# Patient Record
Sex: Female | Born: 1950
Health system: Southern US, Community
[De-identification: ages and names within clinical notes are randomized; demographics above are authoritative.]

## PROBLEM LIST (undated history)

## (undated) DIAGNOSIS — Z860100 Personal history of colon polyps, unspecified: Secondary | ICD-10-CM

## (undated) DIAGNOSIS — R011 Cardiac murmur, unspecified: Secondary | ICD-10-CM

## (undated) DIAGNOSIS — F329 Major depressive disorder, single episode, unspecified: Secondary | ICD-10-CM

## (undated) DIAGNOSIS — D649 Anemia, unspecified: Secondary | ICD-10-CM

## (undated) DIAGNOSIS — L4050 Arthropathic psoriasis, unspecified: Secondary | ICD-10-CM

## (undated) DIAGNOSIS — F419 Anxiety disorder, unspecified: Secondary | ICD-10-CM

## (undated) DIAGNOSIS — F32A Depression, unspecified: Secondary | ICD-10-CM

## (undated) DIAGNOSIS — I10 Essential (primary) hypertension: Secondary | ICD-10-CM

## (undated) DIAGNOSIS — T7840XA Allergy, unspecified, initial encounter: Secondary | ICD-10-CM

## (undated) DIAGNOSIS — M1990 Unspecified osteoarthritis, unspecified site: Secondary | ICD-10-CM

## (undated) DIAGNOSIS — I2510 Atherosclerotic heart disease of native coronary artery without angina pectoris: Secondary | ICD-10-CM

## (undated) DIAGNOSIS — K529 Noninfective gastroenteritis and colitis, unspecified: Secondary | ICD-10-CM

## (undated) DIAGNOSIS — E785 Hyperlipidemia, unspecified: Secondary | ICD-10-CM

## (undated) HISTORY — DX: Anxiety disorder, unspecified: F41.9

## (undated) HISTORY — DX: Allergy, unspecified, initial encounter: T78.40XA

## (undated) HISTORY — PX: WISDOM TOOTH EXTRACTION: SHX21

## (undated) HISTORY — DX: Depression, unspecified: F32.A

## (undated) HISTORY — DX: Unspecified osteoarthritis, unspecified site: M19.90

## (undated) HISTORY — DX: Arthropathic psoriasis, unspecified: L40.50

## (undated) HISTORY — DX: Hyperlipidemia, unspecified: E78.5

## (undated) HISTORY — DX: Major depressive disorder, single episode, unspecified: F32.9

## (undated) HISTORY — DX: Atherosclerotic heart disease of native coronary artery without angina pectoris: I25.10

## (undated) HISTORY — DX: Personal history of colon polyps, unspecified: Z86.0100

## (undated) HISTORY — DX: Personal history of colonic polyps: Z86.010

## (undated) HISTORY — DX: Essential (primary) hypertension: I10

## (undated) HISTORY — PX: TUBAL LIGATION: SHX77

---

## 1998-08-11 ENCOUNTER — Emergency Department (HOSPITAL_COMMUNITY): Admission: EM | Admit: 1998-08-11 | Discharge: 1998-08-11 | Payer: Self-pay | Admitting: Emergency Medicine

## 1998-08-16 ENCOUNTER — Ambulatory Visit (HOSPITAL_COMMUNITY): Admission: RE | Admit: 1998-08-16 | Discharge: 1998-08-16 | Payer: Self-pay | Admitting: Cardiovascular Disease

## 1998-12-03 ENCOUNTER — Other Ambulatory Visit: Admission: RE | Admit: 1998-12-03 | Discharge: 1998-12-03 | Payer: Self-pay | Admitting: Obstetrics & Gynecology

## 1999-10-15 ENCOUNTER — Other Ambulatory Visit: Admission: RE | Admit: 1999-10-15 | Discharge: 1999-10-15 | Payer: Self-pay | Admitting: Family Medicine

## 1999-10-17 ENCOUNTER — Ambulatory Visit (HOSPITAL_COMMUNITY): Admission: RE | Admit: 1999-10-17 | Discharge: 1999-10-17 | Payer: Self-pay | Admitting: *Deleted

## 2001-05-16 ENCOUNTER — Other Ambulatory Visit: Admission: RE | Admit: 2001-05-16 | Discharge: 2001-05-16 | Payer: Self-pay | Admitting: Obstetrics & Gynecology

## 2002-11-03 ENCOUNTER — Ambulatory Visit (HOSPITAL_BASED_OUTPATIENT_CLINIC_OR_DEPARTMENT_OTHER): Admission: RE | Admit: 2002-11-03 | Discharge: 2002-11-03 | Payer: Self-pay | Admitting: Internal Medicine

## 2002-11-13 ENCOUNTER — Other Ambulatory Visit: Admission: RE | Admit: 2002-11-13 | Discharge: 2002-11-13 | Payer: Self-pay | Admitting: Obstetrics & Gynecology

## 2004-06-03 ENCOUNTER — Other Ambulatory Visit: Admission: RE | Admit: 2004-06-03 | Discharge: 2004-06-03 | Payer: Self-pay | Admitting: Obstetrics & Gynecology

## 2005-07-22 ENCOUNTER — Other Ambulatory Visit: Admission: RE | Admit: 2005-07-22 | Discharge: 2005-07-22 | Payer: Self-pay | Admitting: Obstetrics & Gynecology

## 2006-09-29 ENCOUNTER — Encounter: Payer: Self-pay | Admitting: Cardiology

## 2006-11-17 ENCOUNTER — Encounter: Payer: Self-pay | Admitting: Cardiology

## 2007-01-25 ENCOUNTER — Encounter: Payer: Self-pay | Admitting: Cardiology

## 2007-02-16 ENCOUNTER — Encounter: Admission: RE | Admit: 2007-02-16 | Discharge: 2007-02-16 | Payer: Self-pay | Admitting: Cardiovascular Disease

## 2007-02-16 ENCOUNTER — Encounter: Payer: Self-pay | Admitting: Cardiology

## 2007-05-04 ENCOUNTER — Ambulatory Visit: Payer: Self-pay | Admitting: Gastroenterology

## 2007-05-31 ENCOUNTER — Ambulatory Visit: Payer: Self-pay | Admitting: Gastroenterology

## 2007-05-31 ENCOUNTER — Encounter: Payer: Self-pay | Admitting: Gastroenterology

## 2007-06-20 ENCOUNTER — Encounter: Payer: Self-pay | Admitting: Cardiology

## 2008-03-05 ENCOUNTER — Encounter: Payer: Self-pay | Admitting: Cardiology

## 2008-05-22 ENCOUNTER — Encounter: Payer: Self-pay | Admitting: Cardiology

## 2008-06-05 ENCOUNTER — Encounter: Payer: Self-pay | Admitting: Cardiology

## 2009-03-13 ENCOUNTER — Encounter: Payer: Self-pay | Admitting: Cardiology

## 2009-04-10 ENCOUNTER — Encounter: Payer: Self-pay | Admitting: Cardiology

## 2009-06-12 ENCOUNTER — Emergency Department (HOSPITAL_COMMUNITY): Admission: EM | Admit: 2009-06-12 | Discharge: 2009-06-13 | Payer: Self-pay | Admitting: Emergency Medicine

## 2009-10-15 ENCOUNTER — Encounter: Payer: Self-pay | Admitting: Cardiology

## 2009-11-13 LAB — CONVERTED CEMR LAB: Pap Smear: NORMAL

## 2010-02-20 ENCOUNTER — Encounter: Payer: Self-pay | Admitting: Cardiology

## 2010-10-01 LAB — HM PAP SMEAR: HM Pap smear: NORMAL

## 2010-10-20 ENCOUNTER — Telehealth (INDEPENDENT_AMBULATORY_CARE_PROVIDER_SITE_OTHER): Payer: Self-pay | Admitting: *Deleted

## 2010-10-20 ENCOUNTER — Ambulatory Visit: Payer: Self-pay | Admitting: Internal Medicine

## 2010-10-20 DIAGNOSIS — M545 Low back pain, unspecified: Secondary | ICD-10-CM | POA: Insufficient documentation

## 2010-10-20 DIAGNOSIS — Z860100 Personal history of colon polyps, unspecified: Secondary | ICD-10-CM | POA: Insufficient documentation

## 2010-10-20 DIAGNOSIS — E785 Hyperlipidemia, unspecified: Secondary | ICD-10-CM | POA: Insufficient documentation

## 2010-10-20 DIAGNOSIS — R071 Chest pain on breathing: Secondary | ICD-10-CM

## 2010-10-20 DIAGNOSIS — M1990 Unspecified osteoarthritis, unspecified site: Secondary | ICD-10-CM | POA: Insufficient documentation

## 2010-10-20 DIAGNOSIS — I10 Essential (primary) hypertension: Secondary | ICD-10-CM

## 2010-10-20 DIAGNOSIS — J4530 Mild persistent asthma, uncomplicated: Secondary | ICD-10-CM | POA: Insufficient documentation

## 2010-10-20 DIAGNOSIS — J309 Allergic rhinitis, unspecified: Secondary | ICD-10-CM

## 2010-10-20 DIAGNOSIS — F418 Other specified anxiety disorders: Secondary | ICD-10-CM

## 2010-10-21 ENCOUNTER — Encounter: Payer: Self-pay | Admitting: Internal Medicine

## 2010-10-30 ENCOUNTER — Encounter: Payer: Self-pay | Admitting: Internal Medicine

## 2010-10-30 ENCOUNTER — Telehealth: Payer: Self-pay | Admitting: Internal Medicine

## 2010-10-30 ENCOUNTER — Ambulatory Visit: Payer: Self-pay | Admitting: Internal Medicine

## 2010-10-30 DIAGNOSIS — H9319 Tinnitus, unspecified ear: Secondary | ICD-10-CM | POA: Insufficient documentation

## 2010-10-30 DIAGNOSIS — R002 Palpitations: Secondary | ICD-10-CM

## 2010-10-30 DIAGNOSIS — H9313 Tinnitus, bilateral: Secondary | ICD-10-CM | POA: Insufficient documentation

## 2010-10-30 LAB — CONVERTED CEMR LAB
ALT: 15 units/L (ref 0–35)
Albumin: 4.1 g/dL (ref 3.5–5.2)
Alkaline Phosphatase: 62 units/L (ref 39–117)
Basophils Relative: 0.9 % (ref 0.0–3.0)
Bilirubin, Direct: 0.1 mg/dL (ref 0.0–0.3)
CO2: 31 meq/L (ref 19–32)
Chloride: 100 meq/L (ref 96–112)
Eosinophils Absolute: 0.1 10*3/uL (ref 0.0–0.7)
Eosinophils Relative: 1.5 % (ref 0.0–5.0)
Hemoglobin: 12.1 g/dL (ref 12.0–15.0)
MCHC: 33.3 g/dL (ref 30.0–36.0)
MCV: 85.8 fL (ref 78.0–100.0)
Monocytes Absolute: 0.3 10*3/uL (ref 0.1–1.0)
Neutro Abs: 1.7 10*3/uL (ref 1.4–7.7)
Potassium: 3.5 meq/L (ref 3.5–5.1)
RBC: 4.22 M/uL (ref 3.87–5.11)
Sodium: 138 meq/L (ref 135–145)
Total Protein: 7.2 g/dL (ref 6.0–8.3)
WBC: 3.8 10*3/uL — ABNORMAL LOW (ref 4.5–10.5)

## 2010-11-03 ENCOUNTER — Ambulatory Visit: Payer: Self-pay | Admitting: Cardiology

## 2010-11-11 ENCOUNTER — Ambulatory Visit: Payer: Self-pay | Admitting: Cardiology

## 2010-11-11 ENCOUNTER — Encounter: Payer: Self-pay | Admitting: Cardiology

## 2010-11-11 ENCOUNTER — Ambulatory Visit: Payer: Self-pay

## 2010-11-11 ENCOUNTER — Ambulatory Visit (HOSPITAL_COMMUNITY): Admission: RE | Admit: 2010-11-11 | Discharge: 2010-11-11 | Payer: Self-pay | Admitting: Cardiology

## 2010-11-11 ENCOUNTER — Ambulatory Visit: Payer: Self-pay | Admitting: Internal Medicine

## 2010-11-14 ENCOUNTER — Telehealth: Payer: Self-pay | Admitting: Cardiology

## 2010-11-14 ENCOUNTER — Telehealth: Payer: Self-pay | Admitting: Internal Medicine

## 2010-11-20 ENCOUNTER — Telehealth: Payer: Self-pay | Admitting: Internal Medicine

## 2010-11-28 ENCOUNTER — Ambulatory Visit: Payer: Self-pay | Admitting: Cardiology

## 2011-01-13 NOTE — Progress Notes (Signed)
  Phone Note Other Incoming   Request: Send information Summary of Call: Patient completed a [facility] medical release to obtain copies of her records from Dr. Roseanne Kaufman office. Release faxed to [phone]. Patient also requested a copy of her completed release.

## 2011-01-13 NOTE — Assessment & Plan Note (Signed)
Summary: NP6/ PALPS/ PT HAS BCBS. GD  Medications Added DICLOFENAC SODIUM CR 100 MG XR24H-TAB (DICLOFENAC SODIUM) as needed FLUTICASONE PROPIONATE 50 MCG/ACT SUSP (FLUTICASONE PROPIONATE) as needed MIMVEY 1-0.5 MG TABS (ESTRADIOL-NORETHINDRONE ACET) 1 tab every other day ASTEPRO 0.15 % SOLN (AZELASTINE HCL) as needed KLOR-CON M20 20 MEQ CR-TABS (POTASSIUM CHLORIDE CRYS CR) Take 1 tablet daily        Visit Type:  new pt visit Referring Charmaine Placido:  Sanda Linger Primary Cleatis Fandrich:  Etta Grandchild MD  CC:  palpitations....sob....denies any chest discomfort.....some edema at times.....  History of Present Illness: [name] is a 60 year old black female referred by Dr. Yetta Barre for palpitations.  These been evaluated in the past when she was going through menopause. She has been followed by Dr. Wylie Hail who by her history did an echocardiogram which was normal as well as a stress test. She has sign release of the records but we do not have them.  Her palpitations got worse over the last couple months. She was taking Wellbutrin which when she stopped her palpitations improved. There is spontaneous. They do not occur occur with exertion. They only lasts a second or 2 have not been associated with chest pain, syncope or presyncope.  She drinks 2 cups of caffeinated coffee per day. She has done cocaine in the past but not for a number of years. She quit smoking in 1988.  Recent blood work by Dr. Vanessa Kick showed potassium 3.5, normal TSH, hemoglobin 12.1. Her electrocardiogram has shown T-wave inversion the past. Today shows sinus rhythm with low voltage with RSR prime in V1 and V2 with T-wave inversion in V1 through V6. I do not have an old one to compare. This was obtained on 17 November by Dr. Yetta Barre.  Current Medications (verified): 1)  Diclofenac Sodium Cr 100 Mg Xr24h-Tab (Diclofenac Sodium) .... As Needed 2)  Fluticasone Propionate 50 Mcg/act Susp (Fluticasone Propionate) .... As Needed 3)   Hydrochlorothiazide 25 Mg Tabs (Hydrochlorothiazide) .... Take 1 Tablet By Mouth Once A Day 4)  Alprazolam 0.25 Mg Tabs (Alprazolam) .... Take 1 Tablet By Mouth Once A Day As Needed 5)  Mimvey 1-0.5 Mg Tabs (Estradiol-Norethindrone Acet) .[name] Kitchen.. 1 Tab Every Other Day 6)  Astepro 0.15 % Soln (Azelastine Hcl) .... As Needed  Allergies: 1)  ! Tetracycline 2)  ! Cymbalta 3)  ! 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[employer] Divorced Never Smoked Alcohol use-yes Drug use-no Regular exercise-no  Risk Factors: Alcohol Use: <1 (10/30/2010) >5 drinks/d w/in last 3 months: no (10/30/2010) Exercise: no (10/20/2010)  Risk Factors: Smoking Status: never (10/30/2010)  Review of Systems       negative other than history of present illness the  Vital Signs:  Patient profile:   60 year old female Menstrual status:  postmenopausal Height:      65 inches Weight:      219.4 pounds BMI:     36.64 Pulse rate:   66 / minute Pulse rhythm:   regular BP sitting:   118 / 82  (left arm) Cuff size:   large  Vitals Entered By: Danielle Rankin, CMA (November 03, 2010 3:08 PM)  Physical Exam  General:  obese.  pleasant, in no  acute distress, poor historian. Head:  normocephalic and atraumatic Eyes:  PERRLA/EOM intact; conjunctiva and lids normal. Neck:  Neck supple, no JVD. No masses, thyromegaly or abnormal cervical nodes. Lungs:  Clear bilaterally to auscultation and percussion. Heart:  A. poorly appreciated, soft S1-S2, soft systolic murmur with inspiration, S2 splits, no right ventricular lift, carotids are  full without bruits Abdomen:  soft, good bowel sounds, nondistended, no bruits Msk:  Back normal, normal gait. Muscle strength and tone normal. Pulses:  pulses normal in all 4 extremities Extremities:  No clubbing or cyanosis. Neurologic:  Alert and oriented x 3. Skin:  Intact without lesions or rashes. Psych:  Normal affect.easily distracted.     Impression & Recommendations:  Problem # 1:  PALPITATIONS (ICD-785.1) Assessment Deteriorated  I suspect these are benign premature beats. We'll obtain a 24-hour Holter monitor to identify them since this is been somewhat confusing to the patient in the past. We'll also obtain a 2-D echocardiogram particularly with EKG changes to make sure there is no structural heart disease. I suspect that her palpitations would be less if her potassium was above 4. Will prescribe potassium supplementation of 20 mEq a day. We'll call patient with results of the echo and Holter. We'll also try to get the records from her previous cardiologist as noted in the history of present illness. The following medications were removed from the medication list:    Metoprolol Succinate 25 Mg Xr24h-tab (Metoprolol succinate) .[name] Kitchen... Take 1 tablet by mouth once a day  Orders: EKG w/ Interpretation (93000) Holter Monitor (Holter Monitor) Echocardiogram (Echo)  Problem # 2:  HYPERTENSION (ICD-401.9)  The following medications were removed from the medication list:    Metoprolol Succinate 25 Mg Xr24h-tab (Metoprolol succinate) .[name] Kitchen... Take 1 tablet by mouth once a day Her updated medication list for this problem includes:    Hydrochlorothiazide 25 Mg Tabs (Hydrochlorothiazide) .[name] Kitchen... Take 1 tablet by mouth once a day  Patient Instructions: 1)  Your physician recommends that you schedule a follow-up appointment in: 2-3 weeks with Dr. Daleen Squibb 2)  Your physician has recommended you make the following change in your medication:  3)  Your physician has requested that you have an  echocardiogram.  Echocardiography is a painless test that uses sound waves to create images of your heart. It provides your doctor with information about the size and shape of your heart and how well your heart's chambers and valves are working.  This procedure takes approximately one hour. There are no restrictions for this procedure. 4)  Your physician has recommended that you wear a holter monitor.  Holter monitors are medical devices that record the heart's electrical activity. Doctors most often use these monitors to diagnose arrhythmias. Arrhythmias are problems with the speed or rhythm of the heartbeat. The monitor is a small, portable device. You can wear one while you do your normal daily activities. This is usually used to diagnose what is causing palpitations/syncope (passing out). Prescriptions: KLOR-CON M20 20 MEQ CR-TABS (POTASSIUM CHLORIDE CRYS CR) Take 1 tablet daily  #30 x 6   Entered by:   Lisabeth Devoid RN   Authorized by:   Gaylord Shih, MD, [facility]   Signed by:   Lisabeth Devoid RN on 11/03/2010   Method used:   Electronically to        [facility]  [address] [phone] (retail)       [address]       [place]  [place], [place]  [id number]       Ph: [phone] or [phone]       Fax: [phone]   RxID:   [id number]

## 2011-01-13 NOTE — Letter (Signed)
Summary: Results Follow-up Letter  [facility]  [address] [place], [place] [address]   Phone: [phone]  Fax: [phone]    10/21/2010  [address] [facility] [place], [place]  [address]  Dear Ms. Esqueda,   The following are the results of your recent test(s):  Test     Result     Chest Xray     normal Low back Xray   worsening arthritis  _________________________________________________________  Please call for an appointment in 1-2 months _________________________________________________________ _________________________________________________________ _________________________________________________________  Sincerely,  Sanda Linger MD [facility]

## 2011-01-13 NOTE — Progress Notes (Signed)
Summary: echo results   Phone Note Call from Patient Call back at cell#[phone]   Caller: Patient Reason for Call: Talk to Nurse, Lab or Test Results Summary of Call: re echo results Initial call taken by: Roe Coombs,  November 14, 2010 3:29 PM  Follow-up for Phone Call        11/14/10--1600pm--pt calling about results of echo--when reviewing echo i realized it had not been signed by dr wall but by dr Adalberto Ill as reader,  and by PCP,  dr jones--dr Yetta Barre wanted pt  to call and f/u with dr wall, concerning echo--looking at results I  realized dr wall must look at it before i call pt to have her make an appoint.--i explained this to pt and reassured pt that this was nothing life threatening and that i would rather have dr wall look at it before we brought her in --also gave results of monitor she wore, which were also OK --Dr wall--please advise what you would like [place] to do--thanks nt Follow-up by: Ledon Snare, RN,  November 14, 2010 4:26 PM     Appended Document: echo results see echo note.  Reviewed Juanito Doom, MD

## 2011-01-13 NOTE — Progress Notes (Signed)
Summary: RESULTS  Phone Note Call from Patient Call back at [phone]   Summary of Call: Patient is requesting results of echo.  Initial call taken by: Lamar Sprinkles, CMA,  November 14, 2010 1:50 PM  Follow-up for Phone Call        abnormal, please f/up with Dr. Daleen Squibb Follow-up by: Etta Grandchild MD,  November 14, 2010 1:53 PM  Additional Follow-up for Phone Call Additional follow up Details #1::        Pt informed  Additional Follow-up by: Lamar Sprinkles, CMA,  November 14, 2010 3:16 PM

## 2011-01-13 NOTE — Assessment & Plan Note (Signed)
Summary: NEW/ BCBS /[place]  #   Vital Signs:  Patient profile:   60 year old female Menstrual status:  postmenopausal Height:      65 inches Weight:      214.50 pounds BMI:     35.82 O2 Sat:      97 % on Room air Temp:     98.2 degrees F oral Pulse rate:   84 / minute Pulse rhythm:   regular Resp:     16 per minute BP sitting:   128 / 80  (left arm) Cuff size:   large  Vitals Entered By: Rock Nephew CMA (October 20, 2010 2:53 PM)  Nutrition Counseling: Patient's BMI is greater than 25 and therefore counseled on weight management options.  O2 Flow:  Room air CC: New to establish c/o back , fatigue and arthritis, Back Pain Is Patient Diabetic? No Pain Assessment Patient in pain? no       Does patient need assistance? Functional Status Self care Ambulation Normal     Menstrual Status postmenopausal Last PAP Result normal   Primary Care Provider:  Etta Grandchild MD  CC:  New to establish c/o back , fatigue and arthritis, and Back Pain.  History of Present Illness: New to me she needs a new PCP. She has been seeing another doctor for an abnormal EKG and it sounds like she has diastolic dysfunction. She has no records with her today. Her main complaint is pain in her left rib cage and low back for over a year. Her prior MD told her that she had DJD and she gets good relief with an nsaid.  Also, she wants to stop wellbutrin due to palpitations and she feels like celexa is making her too sleepy.  Back Pain History:      The patient's back pain started approximately 10/22/2009.  The pain is located in the lower back region and does not radiate below the knees.  She states this is not work related.  On a scale of 1-10, she describes the pain as a 2.  She states that she has no prior history of back pain.  The patient has not had any recent physical therapy for her back pain.    Critical Exclusionary Diagnosis Criteria (CEDC) for Back Pain:      The patient denies a  history of previous trauma.  She has no prior history of spinal surgery.  There are no symptoms to suggest infection, cauda equina, or psychosocial factors for back pain.  Cancer risk factors include age >50 yrs with new back pain.    Preventive Screening-Counseling & Management  Alcohol-Tobacco     Alcohol drinks/day: <1     Alcohol type: all     >5/day in last 3 mos: no     Alcohol Counseling: not indicated; use of alcohol is not excessive or problematic     Feels need to cut down: no     Feels annoyed by complaints: no     Feels guilty re: drinking: no     Needs 'eye opener' in am: no     Smoking Status: never     Tobacco Counseling: not indicated; no tobacco use  Caffeine-Diet-Exercise     Does Patient Exercise: no  Hep-HIV-STD-Contraception     Hepatitis Risk: no risk noted     HIV Risk: no risk noted     STD Risk: no risk noted      Drug Use:  no.  Medications Prior to Update: 1)  None  Current Medications (verified): 1)  Diclofenac Sodium Cr 100 Mg Xr24h-Tab (Diclofenac Sodium) .... Take 1 Tablet By Mouth Once A Day As Needed 2)  Budeprion Xl 150 Mg Xr24h-Tab (Bupropion Hcl) .... Take 1 Tablet By Mouth Every Morning 3)  Fluticasone Propionate 50 Mcg/act Susp (Fluticasone Propionate) .... 2 Puffs Each Nostril Once Daily 4)  Hydrochlorothiazide 25 Mg Tabs (Hydrochlorothiazide) .... Take 1 Tablet By Mouth Once A Day 5)  Alprazolam 0.25 Mg Tabs (Alprazolam) .... Take 1 Tablet By Mouth Once A Day As Needed 6)  Metoprolol Succinate 25 Mg Xr24h-Tab (Metoprolol Succinate) .... Take 1 Tablet By Mouth Once A Day 7)  Citalopram Hydrobromide 40 Mg Tabs (Citalopram Hydrobromide) .... 1/2 Once Daily 8)  Mimvey 1-0.5 Mg Tabs (Estradiol-Norethindrone Acet) 9)  Astepro 0.15 % Soln (Azelastine Hcl)  Allergies (verified): 1)  ! Tetracycline 2)  ! Cymbalta 3)  ! 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  The patient denies anorexia, fever, syncope, dyspnea on exertion, peripheral edema, prolonged cough, headaches, hemoptysis, abdominal pain, melena, hematochezia, severe indigestion/heartburn, muscle weakness, suspicious skin lesions, difficulty walking, depression, enlarged lymph nodes, angioedema, and breast masses.   CV:  Denies fainting, fatigue, leg cramps with exertion, lightheadness, near fainting, palpitations, shortness of breath with exertion, and swelling of feet. Resp:  Complains of chest discomfort; denies chest pain with inspiration, cough, coughing up blood, excessive snoring, morning headaches, sputum productive, and wheezing. Psych:  Denies alternate hallucination ( auditory/visual), anxiety, depression, easily angered, easily tearful, irritability, mental problems, panic attacks, sense of great danger, suicidal thoughts/plans, thoughts of violence, unusual visions or sounds, and thoughts /plans of harming others.  Physical Exam  General:  alert, well-developed, well-nourished, well-hydrated, appropriate dress, normal appearance, healthy-appearing, cooperative to examination, good hygiene, and overweight-appearing.   Head:  normocephalic, atraumatic, no abnormalities observed, and no abnormalities palpated.   Eyes:  vision grossly intact, pupils equal, pupils round, and pupils reactive to light.   Mouth:  Oral mucosa and oropharynx without lesions or exudates.   Teeth in good repair. Neck:  supple, full ROM, no masses, no thyromegaly, no JVD, normal carotid upstroke, no carotid bruits, no cervical lymphadenopathy, and no neck tenderness.   Chest Wall:  she has ttp over the left posterior rib cage but no crepitance or skin lesions. Lungs:  normal respiratory effort, no intercostal retractions, no accessory muscle use, normal breath sounds, no dullness, no fremitus, no crackles, and no wheezes.   Heart:  normal rate, regular rhythm, no murmur, no gallop, no rub, and no JVD.   Abdomen:  soft, non-tender, normal bowel sounds, no distention, no masses, no guarding, no rigidity, no rebound tenderness, no abdominal hernia, no inguinal hernia, no hepatomegaly, and no splenomegaly.   Msk:  No deformity or scoliosis noted of thoracic or lumbar spine.   Pulses:  R and L carotid,radial,femoral,dorsalis pedis and posterior tibial pulses are full and equal bilaterally  Extremities:  No clubbing, cyanosis, edema, or deformity noted with normal full range of motion of all joints.   Neurologic:  No cranial nerve deficits noted. Station and gait are normal. Plantar reflexes are down-going bilaterally. DTRs are symmetrical throughout. Sensory, motor and coordinative functions appear intact. Skin:  Intact without suspicious lesions or rashes Cervical Nodes:  No lymphadenopathy noted Axillary Nodes:  no R axillary adenopathy and no L axillary adenopathy.   Inguinal Nodes:  no R inguinal adenopathy and no L inguinal adenopathy.   Psych:  Cognition and judgment appear intact. Alert and cooperative with normal attention span and concentration. No apparent delusions, illusions, hallucinations  Low Back Pain Physical Exam:    Inspection-deformity:     No    Palpation-spinal tenderness:   No    Motor Exam/Strength:         Left Ankle Dorsiflexion (L5,L4):     normal       Left Great Toe Dorsiflexion (L5,L4):     normal       Left Heel Walk (L5,some L4):     normal       Left  Single Squat & Rise-Quads (L4):   normal       Left Toe Walk-calf (S1):       normal       Right Ankle Dorsiflexion (L5,L4):     normal       Right Great Toe Dorsiflexion (L5,L4):       normal       Right Heel Walk (L5,some L4):     normal       Right Single Squat & Rise Quads (L4):   normal       Right Toe Walk-calf (S1):       normal    Sensory Exam/Pinprick:        Left Medial Foot (L4):   normal       Left Dorsal Foot (L5):   normal       Left Lateral Foot (S1):   normal       Right Medial Foot (L4):   normal       Right Dorsal Foot (L5):   normal       Right Lateral Foot (S1):   normal    Reflexes:        Left Knee Jerk (L4):     normal       Left Ankle Reflex (S1):   normal       Right Knee Jerk:     normal       Right Ankle Reflex (S1):   normal    Straight Leg Raise (SLR):       Left Straight Leg Raise (SLR):   negative       Right Straight Leg Raise (SLR):   negative   Impression & Recommendations:  Problem # 1:  CHEST WALL PAIN, ACUTE (ICD-786.52) will check for abnormalities in the rib cage, she tells me that her heart has had a thorough exam several times, I await records from Dr. Kallie Edward Her updated medication list for this problem includes:    Diclofenac Sodium Cr 100 Mg Xr24h-tab (Diclofenac sodium) .[name] Kitchen... Take 1 tablet by mouth once a day as needed  Orders: T-2 View CXR (71020TC)  Problem # 2:  LOW BACK PAIN, ACUTE (ICD-724.2) Assessment: New will check her for DJD Her updated medication list for this problem includes:    Diclofenac Sodium Cr 100 Mg Xr24h-tab (Diclofenac sodium) .[name] Kitchen... Take 1 tablet  by mouth once a day as needed  Orders: T-Lumbar Spine Complete, 5 Views (71110TC)  Problem # 3:  HYPERTENSION (ICD-401.9) Assessment: Improved  Her updated medication list for this problem includes:    Hydrochlorothiazide 25 Mg Tabs (Hydrochlorothiazide) .[name] Kitchen... Take 1 tablet by mouth once a day    Metoprolol Succinate 25 Mg Xr24h-tab (Metoprolol succinate) .[name] Kitchen...  Take 1 tablet by mouth once a day  BP today: 128/80  Problem # 4:  DEPRESSION (ICD-311) Assessment: Unchanged she will taper off of citalopram The following medications were removed from the medication list:    Budeprion Xl 150 Mg Xr24h-tab (Bupropion hcl) .[name] Kitchen... Take 1 tablet by mouth every morning Her updated medication list for this problem includes:    Alprazolam 0.25 Mg Tabs (Alprazolam) .[name] Kitchen... Take 1 tablet by mouth once a day as needed    Citalopram Hydrobromide 40 Mg Tabs (Citalopram hydrobromide) .[name] Kitchen... 1/2 once daily  Complete Medication List: 1)  Diclofenac Sodium Cr 100 Mg Xr24h-tab (Diclofenac sodium) .... Take 1 tablet by mouth once a day as needed 2)  Fluticasone Propionate 50 Mcg/act Susp (Fluticasone propionate) .... 2 puffs each nostril once daily 3)  Hydrochlorothiazide 25 Mg Tabs (Hydrochlorothiazide) .... Take 1 tablet by mouth once a day 4)  Alprazolam 0.25 Mg Tabs (Alprazolam) .... Take 1 tablet by mouth once a day as needed 5)  Metoprolol Succinate 25 Mg Xr24h-tab (Metoprolol succinate) .... Take 1 tablet by mouth once a day 6)  Citalopram Hydrobromide 40 Mg Tabs (Citalopram hydrobromide) .... 1/2 once daily 7)  Mimvey 1-0.5 Mg Tabs (Estradiol-norethindrone acet) 8)  Astepro 0.15 % Soln (Azelastine hcl)  PAP Screening:    Hx Cervical Dysplasia in last 5 yrs? No    3 normal PAP smears in last 5 yrs? Yes    Last PAP smear:  11/13/2009  Mammogram Screening:    Last Mammogram:  11/13/2009  Osteoporosis Risk Assessment:  Risk Factors for Fracture or Low Bone Density:   Smoking status:       never   Patient Instructions: 1)  Please schedule a follow-up appointment in 1 month. 2)  Take 650-1000mg  of Tylenol every 4-6 hours as needed for relief of pain or comfort of fever AVOID taking more than 4000mg   in a 24 hour period (can cause liver damage in higher doses).   Orders Added: 1)  T-2 View CXR [71020TC] 2)  T-Lumbar Spine Complete, 5 Views [71110TC] 3)  New  Patient Level IV [16109]    Preventive Care Screening  Colonoscopy:    Date:  05/31/2007    Next Due:  06/2017    Results:  done   Mammogram:    Date:  11/13/2009    Results:  normal   Pap Smear:    Date:  11/13/2009    Results:  normal

## 2011-01-13 NOTE — Assessment & Plan Note (Signed)
Summary: palpatations/SD   Vital Signs:  Patient profile:   60 year old female Menstrual status:  postmenopausal Height:      65 inches Weight:      215 pounds O2 Sat:      98 % on Room air Temp:     98.3 degrees F oral Pulse rate:   64 / minute Pulse rhythm:   regular Resp:     16 per minute BP sitting:   136 / 82  (left arm) Cuff size:   large  Vitals Entered By: Rock Nephew CMA (October 30, 2010 4:38 PM)  O2 Flow:  Room air  Primary Care Provider:  Etta Grandchild MD  CC:  Palpitations.  History of Present Illness:  Palpitations      This is a 60 year old woman who presents with Palpitations.  The symptoms began >1 year ago.  The intensity is described as mild.  The patient denies dizziness, presyncope, syncope, chest pain, shortness of breath, and throat tightness.  The patient denies the following symptoms: blurred vision, numbness, weakness, diaphoresis, nausea, shortness of breath, weight loss, and heat intolerance.  The palpitations are described as a sensation of the heart skipping beats.  The palpitations are intermittent and occur weekly.  She has had some prior evaluation but those records are not available to me yet. She has stopped Wellbutrin and Celexa and will soon change to Viibryd per Dr. Evelene Croon. She is also going to stop taking Clonidine.  She also complains of tinnitus for "a while".  She has not had anymore back pain since I last saw her.  She continues to worry about the palpable edge of her left antero-medial rib cage. It is not painful but she is concerned that she can feel it. Xrays were normal.  Hypertension History:      She denies headache, chest pain, palpitations, dyspnea with exertion, orthopnea, PND, peripheral edema, visual symptoms, neurologic problems, syncope, and side effects from treatment.  She notes no problems with any antihypertensive medication side effects.        Positive major cardiovascular risk factors include female age 58 years  old or older, hyperlipidemia, and hypertension.  Negative major cardiovascular risk factors include no history of diabetes, negative family history for ischemic heart disease, and non-tobacco-user status.        Further assessment for target organ damage reveals no history of ASHD, cardiac end-organ damage (CHF/LVH), stroke/TIA, peripheral vascular disease, renal insufficiency, or hypertensive retinopathy.     Preventive Screening-Counseling & Management  Alcohol-Tobacco     Alcohol drinks/day: <1     Alcohol type: all     >5/day in last 3 mos: no     Alcohol Counseling: not indicated; use of alcohol is not excessive or problematic     Feels need to cut down: no     Feels annoyed by complaints: no     Feels guilty re: drinking: no     Needs 'eye opener' in am: no     Smoking Status: never     Tobacco Counseling: not indicated; no tobacco use  Hep-HIV-STD-Contraception     Hepatitis Risk: no risk noted     HIV Risk: no risk noted     STD Risk: no risk noted      Drug Use:  no.    Medications Prior to Update: 1)  Diclofenac Sodium Cr 100 Mg Xr24h-Tab (Diclofenac Sodium) .... Take 1 Tablet By Mouth Once A Day As Needed 2)  Fluticasone Propionate 50 Mcg/act Susp (Fluticasone Propionate) .... 2 Puffs Each Nostril Once Daily 3)  Hydrochlorothiazide 25 Mg Tabs (Hydrochlorothiazide) .... Take 1 Tablet By Mouth Once A Day 4)  Alprazolam 0.25 Mg Tabs (Alprazolam) .... Take 1 Tablet By Mouth Once A Day As Needed 5)  Metoprolol Succinate 25 Mg Xr24h-Tab (Metoprolol Succinate) .... Take 1 Tablet By Mouth Once A Day 6)  Citalopram Hydrobromide 40 Mg Tabs (Citalopram Hydrobromide) .... 1/2 Once Daily 7)  Mimvey 1-0.5 Mg Tabs (Estradiol-Norethindrone Acet) 8)  Astepro 0.15 % Soln (Azelastine Hcl)  Current Medications (verified): 1)  Diclofenac Sodium Cr 100 Mg Xr24h-Tab (Diclofenac Sodium) .... Take 1 Tablet By Mouth Once A Day As Needed 2)  Fluticasone Propionate 50 Mcg/act Susp (Fluticasone  Propionate) .... 2 Puffs Each Nostril Once Daily 3)  Hydrochlorothiazide 25 Mg Tabs (Hydrochlorothiazide) .... Take 1 Tablet By Mouth Once A Day 4)  Alprazolam 0.25 Mg Tabs (Alprazolam) .... Take 1 Tablet By Mouth Once A Day As Needed 5)  Metoprolol Succinate 25 Mg Xr24h-Tab (Metoprolol Succinate) .... Take 1 Tablet By Mouth Once A Day 6)  Mimvey 1-0.5 Mg Tabs (Estradiol-Norethindrone Acet) 7)  Astepro 0.15 % Soln (Azelastine Hcl)  Allergies (verified): 1)  ! Tetracycline 2)  ! Cymbalta 3)  ! 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 Family History of Arthritis Family History Depression  Social History: Reviewed history from 10/20/2010 and no changes required. 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  The patient denies anorexia, fever, weight loss, chest pain, syncope, dyspnea on exertion, peripheral edema, prolonged cough, headaches, hemoptysis, abdominal pain, melena, hematochezia, severe indigestion/heartburn, hematuria, muscle weakness, suspicious skin lesions, difficulty walking, depression, enlarged lymph nodes, and angioedema.   ENT:   Complains of ringing in ears; denies decreased hearing, difficulty swallowing, ear discharge, earache, hoarseness, nasal congestion, nosebleeds, postnasal drainage, sinus pressure, and sore throat. CV:  Complains of palpitations; denies chest pain or discomfort, difficulty breathing at night, fainting, fatigue, leg cramps with exertion, lightheadness, near fainting, shortness of breath with exertion, and swelling of feet. Resp:  Denies chest pain with inspiration, cough, coughing up blood, pleuritic, shortness of breath, sputum productive, and wheezing. Psych:  Denies anxiety, depression, easily angered, easily tearful, irritability, mental problems, panic attacks, sense of great danger, suicidal thoughts/plans, thoughts of violence, and unusual visions or sounds.  Physical Exam  General:  alert, well-developed, well-nourished, well-hydrated, appropriate dress, normal appearance, healthy-appearing, cooperative to examination, good hygiene, and overweight-appearing.   Head:  normocephalic, atraumatic, no abnormalities observed, and no abnormalities palpated.   Eyes:  No corneal or conjunctival inflammation noted. EOMI. Perrla. Funduscopic exam benign, without hemorrhages, exudates or papilledema. Vision grossly normal. Mouth:  Oral mucosa and oropharynx without lesions or exudates.  Teeth in good repair. Neck:  supple, full ROM, no masses, no thyromegaly,  no JVD, normal carotid upstroke, no carotid bruits, no cervical lymphadenopathy, and no neck tenderness.   Chest Wall:  no deformities, no tenderness, and no mass.   Lungs:  normal respiratory effort, no intercostal retractions, no accessory muscle use, normal breath sounds, no dullness, no fremitus, no crackles, and no wheezes.   Heart:  normal rate, regular rhythm, no murmur, no gallop, no rub, and no JVD.   Abdomen:  soft, non-tender, normal bowel sounds, no distention, no masses, no guarding, no rigidity, no rebound tenderness, no abdominal  hernia, no inguinal hernia, no hepatomegaly, and no splenomegaly.   Msk:  No deformity or scoliosis noted of thoracic or lumbar spine.   Pulses:  R and L carotid,radial,femoral,dorsalis pedis and posterior tibial pulses are full and equal bilaterally Extremities:  No clubbing, cyanosis, edema, or deformity noted with normal full range of motion of all joints.   Neurologic:  No cranial nerve deficits noted. Station and gait are normal. Plantar reflexes are down-going bilaterally. DTRs are symmetrical throughout. Sensory, motor and coordinative functions appear intact. Skin:  Intact without suspicious lesions or rashes Cervical Nodes:  no anterior cervical adenopathy and no posterior cervical adenopathy.   Axillary Nodes:  no R axillary adenopathy and no L axillary adenopathy.   Inguinal Nodes:  no R inguinal adenopathy and no L inguinal adenopathy.   Psych:  Cognition and judgment appear intact. Alert and cooperative with normal attention span and concentration. No apparent delusions, illusions, hallucinations Additional Exam:  EKG is unremarkable   Impression & Recommendations:  Problem # 1:  TINNITUS, CHRONIC (ICD-388.30) Assessment New  Orders: Audiology (Audio) Venipuncture [phone]) TLB-BMP (Basic Metabolic Panel-BMET) (80048-METABOL) TLB-CBC Platelet - w/Differential (85025-CBCD) TLB-Hepatic/Liver Function Pnl (80076-HEPATIC) TLB-TSH (Thyroid Stimulating Hormone) (84443-TSH)  Problem # 2:  PALPITATIONS (ICD-785.1) Assessment: New  Her updated medication list for this problem includes:    Metoprolol Succinate 25 Mg Xr24h-tab (Metoprolol succinate) .[name] Kitchen... Take 1 tablet by mouth once a day  Orders: Cardiology Referral (Cardiology) Venipuncture (09381) TLB-BMP (Basic Metabolic Panel-BMET) (80048-METABOL) TLB-CBC Platelet - w/Differential (85025-CBCD) TLB-Hepatic/Liver Function Pnl (80076-HEPATIC) TLB-TSH (Thyroid Stimulating Hormone) (84443-TSH) EKG w/ Interpretation  (93000)  Problem # 3:  HYPERTENSION (ICD-401.9) Assessment: Unchanged  Her updated medication list for this problem includes:    Hydrochlorothiazide 25 Mg Tabs (Hydrochlorothiazide) .[name] Kitchen... Take 1 tablet by mouth once a day    Metoprolol Succinate 25 Mg Xr24h-tab (Metoprolol succinate) .[name] Kitchen... Take 1 tablet by mouth once a day  Orders: Venipuncture (82993) TLB-BMP (Basic Metabolic Panel-BMET) (80048-METABOL) TLB-CBC Platelet - w/Differential (85025-CBCD) TLB-Hepatic/Liver Function Pnl (80076-HEPATIC) TLB-TSH (Thyroid Stimulating Hormone) (84443-TSH)  BP today: 136/82 Prior BP: 128/80 (10/20/2010)  10 Yr Risk Heart Disease: Not enough information  Problem # 4:  DEPRESSION (ICD-311) Assessment: Unchanged  The following medications were removed from the medication list:    Citalopram Hydrobromide 40 Mg Tabs (Citalopram hydrobromide) .[name] Kitchen... 1/2 once daily Her updated medication list for this problem includes:    Alprazolam 0.25 Mg Tabs (Alprazolam) .[name] Kitchen... Take 1 tablet by mouth once a day as needed  Problem # 5:  CHEST WALL PAIN, ACUTE (ZJI-967.89) Assessment: Improved  Her updated medication list for this problem includes:    Diclofenac Sodium Cr 100 Mg Xr24h-tab (Diclofenac sodium) .[name] Kitchen... Take 1 tablet by mouth once a day as needed  Problem # 6:  LOW BACK PAIN, ACUTE (ICD-724.2) Assessment: Improved  Her updated medication list for this problem includes:    Diclofenac Sodium Cr 100 Mg Xr24h-tab (Diclofenac sodium) .[name] Kitchen... Take 1 tablet  by mouth once a day as needed  Complete Medication List: 1)  Diclofenac Sodium Cr 100 Mg Xr24h-tab (Diclofenac sodium) .... Take 1 tablet by mouth once a day as needed 2)  Fluticasone Propionate 50 Mcg/act Susp (Fluticasone propionate) .... 2 puffs each nostril once daily 3)  Hydrochlorothiazide 25 Mg Tabs (Hydrochlorothiazide) .... Take 1 tablet by mouth once a day 4)  Alprazolam 0.25 Mg Tabs (Alprazolam) .... Take 1 tablet by mouth once a day as  needed 5)  Metoprolol Succinate 25 Mg Xr24h-tab (Metoprolol succinate) .... Take 1 tablet by mouth once a day 6)  Mimvey 1-0.5 Mg Tabs (Estradiol-norethindrone acet) 7)  Astepro 0.15 % Soln (Azelastine hcl)  Hypertension Assessment/Plan:      The patient's hypertensive risk group is category B: At least one risk factor (excluding diabetes) with no target organ damage.  Today's blood pressure is 136/82.  Her blood pressure goal is < 140/90.  Patient Instructions: 1)  Please schedule a follow-up appointment in 2 weeks. 2)  It is important that you exercise regularly at least 20 minutes 5 times a week. If you develop chest pain, have severe difficulty breathing, or feel very tired , stop exercising immediately and seek medical attention. 3)  You need to lose weight. Consider a lower calorie diet and regular exercise.  4)  Check your Blood Pressure regularly. If it is above 140/90: you should make an appointment.   Orders Added: 1)  Cardiology Referral [Cardiology] 2)  Audiology [Audio] 3)  Venipuncture [29562] 4)  TLB-BMP (Basic Metabolic Panel-BMET) [80048-METABOL] 5)  TLB-CBC Platelet - w/Differential [85025-CBCD] 6)  TLB-Hepatic/Liver Function Pnl [80076-HEPATIC] 7)  TLB-TSH (Thyroid Stimulating Hormone) [84443-TSH] 8)  EKG w/ Interpretation [93000] 9)  Est. Patient Level V [13086]

## 2011-01-13 NOTE — Procedures (Signed)
Summary: summary report  summary report   Imported By: Mirna Mires 11/13/2010 09:27:36  _____________________________________________________________________  External Attachment:    Type:   Image     Comment:   External Document

## 2011-01-13 NOTE — Progress Notes (Signed)
Summary: test result & med refill  Phone Note Call from Patient Call back at Work Phone [phone]   Caller: [name] [phone] Call For: Etta Grandchild MD Reason for Call: Lab or Test Results Summary of Call: Pt left msg on vm requesting test results & refill on her medicaine. Called pt back to get more info. No ansew LMOM RTC Initial call taken by: Orlan Leavens RMA,  November 20, 2010 10:33 AM  Follow-up for Phone Call        Pt return call back want to know results from echo & heart monitor. also pt want to know can md send refills on her diclofenac & HCTZ pls [facility] Follow-up by: Orlan Leavens RMA,  November 20, 2010 11:27 AM  Additional Follow-up for Phone Call Additional follow up Details #1::        1. call cardiology 2. done Additional Follow-up by: Etta Grandchild MD,  November 20, 2010 11:39 AM    Additional Follow-up for Phone Call Additional follow up Details #2::    Notifed pt md response Follow-up by: Orlan Leavens RMA,  November 20, 2010 12:00 PM  New/Updated Medications: DICLOFENAC SODIUM CR 100 MG XR24H-TAB (DICLOFENAC SODIUM) one by mouth once daily as needed for pain Prescriptions: HYDROCHLOROTHIAZIDE 25 MG TABS (HYDROCHLOROTHIAZIDE) Take 1 tablet by mouth once a day  #30 x 11   Entered and Authorized by:   Etta Grandchild MD   Signed by:   Etta Grandchild MD on 11/20/2010   Method used:   Electronically to        [facility]  [address] [phone]* (retail)       [address]       [place], [place]  [id number]       Ph: [phone] or [phone]       Fax: [phone]   RxID:   [id number] DICLOFENAC SODIUM CR 100 MG XR24H-TAB (DICLOFENAC SODIUM) one by mouth once daily as needed for pain  #30 x 11   Entered and Authorized by:   Etta Grandchild MD   Signed by:   Etta Grandchild MD on 11/20/2010   Method used:   [facility] to        [facility]  [address] [phone]* (retail)       [address]     [place], [place]  [id number]       Ph: [phone] or [phone]       Fax: [phone]   RxID:   [id number]

## 2011-01-13 NOTE — Progress Notes (Signed)
Summary: OV TODAY  Phone Note Call from Patient Call back at Work Phone [phone] Call back at ext [phone] ok vm on wk #   Summary of Call: Patient is requesting to know if MD recieved records from previous MD.  She continues to have episodes of "heart skipping beats", no CP or SOB, more in evening. Also wants to know what to do about increasing arthritis in her back. Advised F/u soon, pt agreed to come in for eval today.  Initial call taken by: Lamar Sprinkles, CMA,  October 30, 2010 12:06 PM

## 2011-01-15 NOTE — Assessment & Plan Note (Signed)
Summary: rov/dfg  Medications Added HYDROCHLOROTHIAZIDE 25 MG TABS (HYDROCHLOROTHIAZIDE) Take 1 tablet by mouth once a day        Visit Type:  rov Referring Quenten Nawaz:  Sanda Linger Primary Barron Vanloan:  Etta Grandchild MD  CC:  pt states she gets palpatations every once in awhile...denies any other cardiac complaints today.  History of Present Illness: Ms Manternach returns today for treatment of her palpitations.  Her echocardiogram was essentially normal. She did have a little bit of diastolic dysfunction from a history of hypertension. Left atrium was mildly increased at 41 mm.  Holter monitor showed no PVCs and frequent supraventricular beats with 46 couplets. She had one brief run of SVT at 8 beats which is 156 beats per minute. No symptoms were reported.  I have not received the records from her previous cardiologist.  Since her last visit, she is having less of these. Her potassium was borderline low at 3.5 we start on 20 mEq of potassium.  Current Medications (verified): 1)  Diclofenac Sodium Cr 100 Mg Xr24h-Tab (Diclofenac Sodium) .... One By Mouth Once Daily As Needed For Pain 2)  Fluticasone Propionate 50 Mcg/act Susp (Fluticasone Propionate) .... As Needed 3)  Hydrochlorothiazide 25 Mg Tabs (Hydrochlorothiazide) .... Take 1 Tablet By Mouth Once A Day 4)  Alprazolam 0.25 Mg Tabs (Alprazolam) .... Take 1 Tablet By Mouth Once A Day As Needed 5)  Mimvey 1-0.5 Mg Tabs (Estradiol-Norethindrone Acet) .[name] Kitchen.. 1 Tab Every Other Day 6)  Astepro 0.15 % Soln (Azelastine Hcl) .... As Needed 7)  Klor-Con M20 20 Meq Cr-Tabs (Potassium Chloride Crys Cr) .... Take 1 Tablet Daily  Allergies: 1)  ! Tetracycline 2)  ! Cymbalta 3)  ! 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 Alcohol use-yes Drug use-no Regular exercise-no  Risk Factors: Alcohol Use: <1 (10/30/2010) >5 drinks/d w/in last 3 months: no (10/30/2010) Exercise: no (10/20/2010)  Risk Factors: Smoking Status: never (10/30/2010)  Review of Systems       negative other than history of present illness  Vital Signs:  Patient profile:   60 year old female Menstrual status:  postmenopausal Height:      65 inches Weight:      224 pounds BMI:     37.41 Pulse rate:   68 / minute Pulse rhythm:   regular BP sitting:   128 / 86  (left arm) Cuff size:   large  Vitals Entered By: Danielle Rankin, CMA (November 28, 2010 3:33 PM)  Physical Exam  General:  obese.   Head:  normocephalic and atraumatic Eyes:  PERRLA/EOM intact; conjunctiva and lids normal. Neck:  Neck supple, no JVD. No masses, thyromegaly or abnormal cervical nodes. Chest Wall:  no deformities or breast masses noted Lungs:  Clear bilaterally to auscultation and percussion. Heart:  PMI not displaced, normal S1-S2, no murmur rub or gallop. Msk:  Back normal, normal gait. Muscle strength and tone normal. Pulses:  pulses normal in all 4 extremities Extremities:  No clubbing or cyanosis. Neurologic:  Alert and oriented x 3. Skin:  Intact without lesions or rashes. Psych:  Normal affect.   Impression & Recommendations:  Problem # 1:  PALPITATIONS (ICD-785.1) Assessment Improved  I have reassured her that these are benign. With a normal echocardiogram, and very infrequent PACs, will not treat with medication. I have asked her to continue with potassium to keep her potassium above 4. This seems to help significantly with her symptoms. Have encouraged her to walk and to make sure her blood pressure is under good  control.  Problem # 2:  HYPERTENSION (ICD-401.9) Assessment: Unchanged  Her updated medication list for this problem includes:    Hydrochlorothiazide 25 Mg Tabs (Hydrochlorothiazide) .[name] Kitchen... Take 1 tablet by mouth once a day  Patient Instructions: 1)  Your physician recommends that you schedule a follow-up appointment in: as needed with Dr. Daleen Squibb 2)  Your physician recommends that you continue on your current medications as directed. Please refer to the Current Medication list given to you today.

## 2011-02-16 ENCOUNTER — Telehealth: Payer: Self-pay | Admitting: Internal Medicine

## 2011-02-19 NOTE — Consult Note (Signed)
Summary: [facility]: Consultation Report  [facility]: Consultation Report   Imported By: Earl Many 02/12/2011 16:13:59  _____________________________________________________________________  External Attachment:    Type:   Image     Comment:   External Document

## 2011-02-19 NOTE — Consult Note (Signed)
Summary: [facility]: Consultation Report  [facility]: Consultation Report   Imported By: Earl Many 02/12/2011 16:15:50  _____________________________________________________________________  External Attachment:    Type:   Image     Comment:   External Document

## 2011-02-19 NOTE — Consult Note (Signed)
Summary: [facility]: Consultation Report  [facility]: Consultation Report   Imported By: Earl Many 02/12/2011 16:11:54  _____________________________________________________________________  External Attachment:    Type:   Image     Comment:   External Document

## 2011-02-19 NOTE — Consult Note (Signed)
Summary: [facility]: Consultation Report  [facility]: Consultation Report   Imported By: Earl Many 02/12/2011 16:10:46  _____________________________________________________________________  External Attachment:    Type:   Image     Comment:   External Document

## 2011-02-19 NOTE — Consult Note (Signed)
Summary: [facility]: Consultation Report  [facility]: Consultation Report   Imported By: Earl Many 02/12/2011 16:17:11  _____________________________________________________________________  External Attachment:    Type:   Image     Comment:   External Document

## 2011-02-19 NOTE — Consult Note (Signed)
Summary: [facility]: Consultation Report  [facility]: Consultation Report   Imported By: Earl Many 02/12/2011 16:18:25  _____________________________________________________________________  External Attachment:    Type:   Image     Comment:   External Document

## 2011-02-19 NOTE — Consult Note (Signed)
Summary: [facility]: Consultation Report  [facility]: Consultation Report   Imported By: Earl Many 02/12/2011 16:19:12  _____________________________________________________________________  External Attachment:    Type:   Image     Comment:   External Document

## 2011-02-19 NOTE — Consult Note (Signed)
Summary: [facility]: Consultation Report  [facility]: Consultation Report   Imported By: Earl Many 02/12/2011 16:14:48  _____________________________________________________________________  External Attachment:    Type:   Image     Comment:   External Document

## 2011-02-19 NOTE — Consult Note (Signed)
Summary: [facility]: Consultation Report  [facility]: Consultation Report   Imported By: Earl Many 02/12/2011 16:15:19  _____________________________________________________________________  External Attachment:    Type:   Image     Comment:   External Document

## 2011-02-19 NOTE — Consult Note (Signed)
Summary: [facility]: Consultation Report  [facility]: Consultation Report   Imported By: Earl Many 02/12/2011 16:12:45  _____________________________________________________________________  External Attachment:    Type:   Image     Comment:   External Document

## 2011-02-19 NOTE — Consult Note (Signed)
Summary: [facility]: Consultation Report  [facility]: Consultation Report   Imported By: Earl Many 02/12/2011 16:17:54  _____________________________________________________________________  External Attachment:    Type:   Image     Comment:   External Document

## 2011-02-19 NOTE — Consult Note (Signed)
Summary: [facility]: Consultation Report  [facility]: Consultation Report   Imported By: Earl Many 02/12/2011 16:13:25  _____________________________________________________________________  External Attachment:    Type:   Image     Comment:   External Document

## 2011-02-24 NOTE — Progress Notes (Signed)
Summary: REFILL   Phone Note Refill Request Call back at Work Phone [phone] Call back at ext [phone] Message from:  Patient  Refills Requested: Medication #1:  ALPRAZOLAM 0.25 MG TABS Take 1 tablet by mouth once a day as needed OK FOR RF?   Initial call taken by: Lamar Sprinkles, CMA,  February 16, 2011 5:19 PM  Follow-up for Phone Call        yes Follow-up by: Etta Grandchild MD,  February 16, 2011 5:21 PM  Additional Follow-up for Phone Call Additional follow up Details #1::        Left vm for pt on cell # Additional Follow-up by: Lamar Sprinkles, CMA,  February 16, 2011 6:05 PM    Prescriptions: ALPRAZOLAM 0.25 MG TABS (ALPRAZOLAM) Take 1 tablet by mouth once a day as needed  #30 x 0   Entered by:   Lamar Sprinkles, CMA   Authorized by:   Etta Grandchild MD   Signed by:   Lamar Sprinkles, CMA on 02/16/2011   Method used:   Telephoned to ...       [facility]  [address] [phone]* (retail)       [address]       [place], [place]  [id number]       Ph: [phone] or [phone]       Fax: [phone]   RxID:   [id number]

## 2011-03-22 LAB — POCT CARDIAC MARKERS
CKMB, poc: <1 ng/mL — ABNORMAL LOW (ref 1.0–8.0)
Myoglobin, poc: 46.4 ng/mL (ref 12–200)
Troponin i, poc: <0.05 ng/mL (ref 0.00–0.09)

## 2011-03-23 LAB — DIFFERENTIAL
Lymphocytes Relative: 39 % (ref 12–46)
Lymphs Abs: 2 10*3/uL (ref 0.7–4.0)
Monocytes Relative: 7 % (ref 3–12)
Neutro Abs: 2.7 10*3/uL (ref 1.7–7.7)
Neutrophils Relative %: 52 % (ref 43–77)

## 2011-03-23 LAB — COMPREHENSIVE METABOLIC PANEL
BUN: 7 mg/dL (ref 6–23)
Calcium: 9.7 mg/dL (ref 8.4–10.5)
Glucose, Bld: 101 mg/dL — ABNORMAL HIGH (ref 70–99)
Total Protein: 7.4 g/dL (ref 6.0–8.3)

## 2011-03-23 LAB — CBC
HCT: 37.4 % (ref 36.0–46.0)
Hemoglobin: 12.4 g/dL (ref 12.0–15.0)
MCHC: 33.2 g/dL (ref 30.0–36.0)
Platelets: 202 10*3/uL (ref 150–400)
RDW: 13.6 % (ref 11.5–15.5)

## 2011-03-23 LAB — POCT CARDIAC MARKERS: Troponin i, poc: <0.05 ng/mL (ref 0.00–0.09)

## 2011-03-23 LAB — BRAIN NATRIURETIC PEPTIDE: Pro B Natriuretic peptide (BNP): <30 pg/mL (ref 0.0–100.0)

## 2011-04-02 ENCOUNTER — Other Ambulatory Visit (INDEPENDENT_AMBULATORY_CARE_PROVIDER_SITE_OTHER): Payer: BC Managed Care – PPO | Admitting: Internal Medicine

## 2011-04-02 ENCOUNTER — Ambulatory Visit (INDEPENDENT_AMBULATORY_CARE_PROVIDER_SITE_OTHER): Payer: Self-pay | Admitting: Internal Medicine

## 2011-04-02 ENCOUNTER — Encounter: Payer: Self-pay | Admitting: Internal Medicine

## 2011-04-02 ENCOUNTER — Other Ambulatory Visit (INDEPENDENT_AMBULATORY_CARE_PROVIDER_SITE_OTHER): Payer: BC Managed Care – PPO

## 2011-04-02 DIAGNOSIS — E785 Hyperlipidemia, unspecified: Secondary | ICD-10-CM

## 2011-04-02 DIAGNOSIS — M1990 Unspecified osteoarthritis, unspecified site: Secondary | ICD-10-CM

## 2011-04-02 DIAGNOSIS — M5136 Other intervertebral disc degeneration, lumbar region: Secondary | ICD-10-CM

## 2011-04-02 DIAGNOSIS — R5383 Other fatigue: Secondary | ICD-10-CM | POA: Insufficient documentation

## 2011-04-02 DIAGNOSIS — I10 Essential (primary) hypertension: Secondary | ICD-10-CM

## 2011-04-02 DIAGNOSIS — Z23 Encounter for immunization: Secondary | ICD-10-CM

## 2011-04-02 DIAGNOSIS — J45909 Unspecified asthma, uncomplicated: Secondary | ICD-10-CM

## 2011-04-02 DIAGNOSIS — J309 Allergic rhinitis, unspecified: Secondary | ICD-10-CM

## 2011-04-02 DIAGNOSIS — F329 Major depressive disorder, single episode, unspecified: Secondary | ICD-10-CM

## 2011-04-02 DIAGNOSIS — R002 Palpitations: Secondary | ICD-10-CM

## 2011-04-02 DIAGNOSIS — M5137 Other intervertebral disc degeneration, lumbosacral region: Secondary | ICD-10-CM

## 2011-04-02 DIAGNOSIS — R5381 Other malaise: Secondary | ICD-10-CM

## 2011-04-02 DIAGNOSIS — M545 Low back pain: Secondary | ICD-10-CM

## 2011-04-02 LAB — CBC WITH DIFFERENTIAL/PLATELET
Basophils Absolute: 0 10*3/uL (ref 0.0–0.1)
Eosinophils Absolute: 0.1 10*3/uL (ref 0.0–0.7)
HCT: 34.1 % — ABNORMAL LOW (ref 36.0–46.0)
Hemoglobin: 11.6 g/dL — ABNORMAL LOW (ref 12.0–15.0)
Lymphocytes Relative: 49.8 % — ABNORMAL HIGH (ref 12.0–46.0)
Lymphs Abs: 1.4 10*3/uL (ref 0.7–4.0)
MCHC: 34.2 g/dL (ref 30.0–36.0)
Neutro Abs: 1.1 10*3/uL — ABNORMAL LOW (ref 1.4–7.7)
Platelets: 209 10*3/uL (ref 150.0–400.0)
RDW: 13.9 % (ref 11.5–14.6)

## 2011-04-02 LAB — URINALYSIS, ROUTINE W REFLEX MICROSCOPIC
Specific Gravity, Urine: 1.02 (ref 1.000–1.030)
Total Protein, Urine: NEGATIVE
Urine Glucose: NEGATIVE
Urobilinogen, UA: 0.2 (ref 0.0–1.0)
pH: 6.5 (ref 5.0–8.0)

## 2011-04-02 LAB — COMPREHENSIVE METABOLIC PANEL
ALT: 16 U/L (ref 0–35)
AST: 18 U/L (ref 0–37)
Alkaline Phosphatase: 53 U/L (ref 39–117)
Creatinine, Ser: 0.7 mg/dL (ref 0.4–1.2)
Total Bilirubin: 0.4 mg/dL (ref 0.3–1.2)

## 2011-04-02 LAB — LIPID PANEL
Cholesterol: 260 mg/dL — ABNORMAL HIGH (ref 0–200)
Triglycerides: 85 mg/dL (ref 0.0–149.0)
VLDL: 17 mg/dL (ref 0.0–40.0)

## 2011-04-02 MED ORDER — DULOXETINE HCL 60 MG PO CPEP: 60.0000 mg | ORAL_CAPSULE | Freq: Every day | ORAL | Status: DC

## 2011-04-02 MED ORDER — MOMETASONE FURO-FORMOTEROL FUM 100-5 MCG/ACT IN AERO: 2.0000 | INHALATION_SPRAY | Freq: Two times a day (BID) | RESPIRATORY_TRACT | Status: DC

## 2011-04-02 NOTE — Patient Instructions (Addendum)
Asthma, Adult Asthma is caused by narrowing of the air passages in the lungs. It may be triggered by pollen, dust, animal dander, molds, some foods, respiratory infections, exposure to smoke, exercise, emotional stress or other allergens (things that cause allergic reactions or allergies). Repeat attacks are common. HOME CARE INSTRUCTIONS  Use prescription medications as ordered by your caregiver.   Avoid pollen, dust, animal dander, molds, smoke and other things that cause attacks at home and at work.   You may have fewer attacks if you decrease dust in your home. Electrostatic air cleaners may help.   It may help to replace your pillows or mattress with materials less likely to cause allergies.   Talk to your caregiver about an action plan for managing asthma attacks at home, including, the use of a peak flow meter which measures the severity of your asthma attack. An action plan can help minimize or stop the attack without having to seek medical care.   If you are not on a fluid restriction, drink 8 to 10 glasses of water each day.   Always have a plan prepared for seeking medical attention, including, calling your physician, accessing local emergency care, and calling 911 (in the [place] for a severe attack.   Discuss possible exercise routines with your caregiver.   If animal dander is the cause of asthma, you may need to get rid of pets.  SEEK MEDICAL CARE IF:  You have wheezing and shortness of breath even if taking medicine to prevent attacks.   An oral temperature above 100.5 develops.   You have muscle aches, chest pain or thickening of sputum.   Your sputum changes from clear or white to yellow, green, gray or bloody.   You have any problems that may be related to the medicine you are taking (such as a rash, itching, swelling or trouble breathing).  SEEK IMMEDIATE MEDICAL CARE IF:  Your usual medicines do not stop your wheezing or there is increased coughing and/or  shortness of breath.   You have increased difficulty breathing.  You have an oral temperature above 100.5Hypertension (High Blood Pressure) As your heart beats, it forces blood through your arteries. This force is your blood pressure. If the pressure is too high, it is called hypertension (HTN) or high blood pressure. HTN is dangerous because you may have it and not know it. High blood pressure may mean that your heart has to work harder to pump blood. Your arteries may be narrow or stiff. The extra work puts you at risk for heart disease, stroke, and other problems.  Blood pressure consists of two numbers, a higher number over a lower, 110/72, for example. It is stated as "110 over 72." The ideal is below 120 for the top number (systolic) and under 80 for the bottom (diastolic). Write down your blood pressure today. You should pay close attention to your blood pressure if you have certain conditions such as: Heart failure. Prior heart attack.  Diabetes  Chronic kidney disease.  Prior stroke.  Multiple risk factors for heart disease.   To see if you have HTN, your blood pressure should be measured while you are seated with your arm held at the level of the heart. It should be measured at least twice. A one-time elevated blood pressure reading (especially in the Emergency Department) does not mean that you need treatment. There may be conditions in which the blood pressure is different between your right and left arms. It is important to see  your caregiver soon for a recheck. Most people have essential hypertension which means that there is not a specific cause. This type of high blood pressure may be lowered by changing lifestyle factors such as: Stress. Smoking.  Lack of exercise.  Excessive weight. Drug/tobacco/alcohol use.  Eating less salt.   Most people do not have symptoms from high blood pressure until it has caused damage to the body. Effective treatment can often prevent, delay or reduce  that damage. TREATMENT Treatment for high blood pressure, when a cause has been identified, is directed at the cause. There are a large number of medications to treat HTN. These fall into several categories, and your caregiver will help you select the medicines that are best for you. Medications may have side effects. You should review side effects with your caregiver. If your blood pressure stays high after you have made lifestyle changes or started on medicines,  Your medication(s) may need to be changed.  Other problems may need to be addressed.  Be certain you understand your prescriptions, and know how and when to take your medicine.  Be sure to follow up with your caregiver within the time frame advised (usually within two weeks) to have your blood pressure rechecked and to review your medications.  If you are taking more than one medicine to lower your blood pressure, make sure you know how and at what times they should be taken. Taking two medicines at the same time can result in blood pressure that is too low.  SEEK IMMEDIATE MEDICAL CARE IF YOU DEVELOP: A severe headache, blurred or changing vision, or confusion.  Unusual weakness or numbness, or a faint feeling.  Severe chest or abdominal pain, vomiting, or breathing problems.  MAKE SURE YOU:  Understand these instructions.  Will watch your condition.  Will get help right away if you are not doing well or get worse.  Document Released: 11/30/2005 Document Re-Released: 05/20/2010  [facility] Patient Information 2011 [place], [place]., not controlled by medicine.  MAKE SURE YOU:  Understand these instructions.   Will watch your condition.   Will get help right away if you are not doing well or get worse.  Document Released: 11/30/2005 Document Re-Released: 12/22/2009 [facility] Patient Information 2011 [place], [place].Back Pain (Lumbosacral Strain) Back pain is one of the most common causes of pain. There are many causes of back  pain. Most are not serious conditions.  CAUSES Your backbone (spinal column) is made up of 24 main vertebral bodies, the sacrum, and the coccyx. These are held together by muscles and tough, fibrous tissue (ligaments). Nerve roots pass through the openings between the vertebrae. A sudden move or injury to the back may cause injury to, or pressure on, these nerves. This may result in localized back pain or pain movement (radiation) into the buttocks, down the leg, and into the foot. Sharp, shooting pain from the buttock down the back of the leg (sciatica) is frequently associated with a ruptured (herniated) disc. Pain may be caused by muscle spasm alone. Your caregiver can often find the cause of your pain by the details of your symptoms and an exam. In some cases, you may need tests (such as X-rays). Your caregiver will work with you to decide if any tests are needed based on your specific exam. HOME CARE INSTRUCTIONS  Avoid an underactive lifestyle. Active exercise, as directed by your caregiver, is your greatest weapon against back pain.   Avoid hard physical activities (tennis, racquetball, water-skiing) if you are not  in proper physical condition for it. This may aggravate and/or create problems.   If you have a back problem, avoid sports requiring sudden body movements. Swimming and walking are generally safer activities.   Maintain good posture.   Avoid becoming overweight (obese).   Use bed rest for only the most extreme, sudden (acute) episode. Your caregiver will help you determine how much bed rest is necessary.   For acute conditions, you may put ice on the injured area.   Put ice in a plastic bag.   Place a towel between your skin and the bag.   Leave the ice on for 100.5 minutes at a time, every 2 hours, or as needed.   After you are improved and more active, it may help to apply heat for 30 minutes before activities.  See your caregiver if you are having pain that lasts  longer than expected. Your caregiver can advise appropriate exercises and/or therapy if needed. With conditioning, most back problems can be avoided. SEEK IMMEDIATE MEDICAL CARE IF:  You have numbness, tingling, weakness, or problems with the use of your arms or legs.   You experience severe back pain not relieved with medicines.   There is a change in bowel or bladder control.   You have increasing pain in any area of the body, including your belly (abdomen).   You notice shortness of breath, dizziness, or feel faint.   You feel sick to your stomach (nauseous), are throwing up (vomiting), or become sweaty.   You notice discoloration of your toes or legs, or your feet get very cold.   Your back pain is getting worse.   You have an oral temperature above 100.5, not controlled by medicine.  MAKE SURE YOU:   Understand these instructions.   Will watch your condition.   Will get help right away if you are not doing well or get worse.  Document Released: 09/09/2005 Document Re-Released: 02/24/2010 [facility] Patient Information 2011 [place], [place].

## 2011-04-02 NOTE — Assessment & Plan Note (Signed)
Referral to pain clinic as this is worsening

## 2011-04-02 NOTE — Assessment & Plan Note (Signed)
Start cymbalta for chronic pain management

## 2011-04-02 NOTE — Assessment & Plan Note (Signed)
Start dulera 

## 2011-04-02 NOTE — Assessment & Plan Note (Signed)
-  Start cymbalta

## 2011-04-02 NOTE — Assessment & Plan Note (Signed)
I think this is related to her overall conditioning and med problem such as pain and depression, I will check routine labs to look for secondary causes

## 2011-04-02 NOTE — Assessment & Plan Note (Signed)
BP is well controlled, I will check renal function and lytes

## 2011-04-02 NOTE — Progress Notes (Signed)
Subjective:    Patient ID: Paula Meyer, female    DOB: 16-Feb-1951, 60 y.o.   MRN: [id number]  Hypertension This is a chronic problem. The current episode started more than 1 year ago. The problem is unchanged. The problem is controlled. Associated symptoms include malaise/fatigue. Pertinent negatives include no anxiety, blurred vision, chest pain, headaches, neck pain, orthopnea, palpitations, peripheral edema, PND, shortness of breath or sweats. There are no associated agents to hypertension. Past treatments include diuretics. The current treatment provides moderate improvement. There are no compliance problems.   Asthma She complains of chest tightness and wheezing. There is no cough, difficulty breathing, frequent throat clearing, hemoptysis, hoarse voice, shortness of breath or sputum production. This is a chronic problem. The current episode started more than 1 year ago. The problem occurs intermittently. The problem has been unchanged. Associated symptoms include appetite change (increased appetite), malaise/fatigue, postnasal drip, rhinorrhea and sneezing. Pertinent negatives include no chest pain, dyspnea on exertion, ear congestion, ear pain, fever, headaches, heartburn, myalgias, nasal congestion, orthopnea, PND, sore throat, sweats, trouble swallowing or weight loss. Her symptoms are aggravated by pollen. Her symptoms are alleviated by beta-agonist. She reports minimal improvement on treatment. Her past medical history is significant for asthma. There is no history of bronchiectasis, bronchitis, COPD, emphysema or pneumonia.  Back Pain This is a chronic problem. The current episode started more than 1 year ago. The problem occurs daily. The problem has been gradually worsening since onset. The pain is present in the lumbar spine and gluteal. The quality of the pain is described as aching. The pain does not radiate. The pain is at a severity of 3/10. The pain is moderate. The pain is worse  during the day. The symptoms are aggravated by bending. Stiffness is present all day. Pertinent negatives include no abdominal pain, bladder incontinence, bowel incontinence, chest pain, dysuria, fever, headaches, leg pain, numbness, paresis, paresthesias, pelvic pain, perianal numbness, tingling, weakness or weight loss. She has tried NSAIDs for the symptoms. The treatment provided mild relief.      Review of Systems  Constitutional: Positive for malaise/fatigue, appetite change (increased appetite), fatigue and unexpected weight change (weight gain). Negative for fever, chills, weight loss, diaphoresis and activity change.  HENT: Positive for rhinorrhea, sneezing and postnasal drip. Negative for hearing loss, ear pain, nosebleeds, congestion, sore throat, hoarse voice, facial swelling, drooling, mouth sores, trouble swallowing, neck pain, dental problem, voice change, sinus pressure, tinnitus and ear discharge.   Eyes: Negative for blurred vision, pain, discharge, redness and itching.  Respiratory: Positive for chest tightness and wheezing. Negative for apnea, cough, hemoptysis, sputum production, choking, shortness of breath and stridor.   Cardiovascular: Negative for chest pain, dyspnea on exertion, palpitations, orthopnea, leg swelling and PND.  Gastrointestinal: Negative for heartburn, nausea, vomiting, abdominal pain, diarrhea, constipation, blood in stool, abdominal distention and bowel incontinence.  Genitourinary: Negative for bladder incontinence, dysuria, frequency, hematuria, difficulty urinating, pelvic pain and dyspareunia.  Musculoskeletal: Positive for back pain. Negative for myalgias, joint swelling, arthralgias and gait problem.  Skin: Negative for color change, pallor and rash.  Neurological: Negative for dizziness, tingling, tremors, seizures, syncope, facial asymmetry, speech difficulty, weakness, light-headedness, numbness, headaches and paresthesias.  Hematological: Negative  for adenopathy. Does not bruise/bleed easily.  Psychiatric/Behavioral: Negative for behavioral problems, confusion, self-injury and agitation. The patient is not nervous/anxious.        Objective:   Physical Exam  Constitutional: She is oriented to person, place, and time. She appears well-developed and well-nourished.  No distress.  HENT:  Head: Normocephalic and atraumatic.  Right Ear: External ear normal.  Left Ear: External ear normal.  Nose: Nose normal.  Mouth/Throat: Oropharynx is clear and moist. No oropharyngeal exudate.  Eyes: Conjunctivae and EOM are normal. Pupils are equal, round, and reactive to light. Right eye exhibits no discharge. Left eye exhibits no discharge. No scleral icterus.  Neck: Normal range of motion. Neck supple. No JVD present.  Cardiovascular: Normal rate, regular rhythm, normal heart sounds and intact distal pulses.  Exam reveals no gallop and no friction rub.   No murmur heard. Pulmonary/Chest: Effort normal and breath sounds normal. No stridor. No respiratory distress. She has no wheezes. She has no rales. She exhibits no tenderness.  Abdominal: Soft. Bowel sounds are normal. She exhibits no distension and no mass. There is no tenderness. There is no rebound and no guarding.  Musculoskeletal: She exhibits no edema and no tenderness.       Lumbar back: Normal. She exhibits normal range of motion, no tenderness, no bony tenderness, no swelling, no edema, no deformity, no pain and no spasm.  Lymphadenopathy:    She has no cervical adenopathy.  Neurological: She is alert and oriented to person, place, and time. She has normal reflexes. She displays normal reflexes. No cranial nerve deficit. She exhibits normal muscle tone. Coordination normal.  Skin: Skin is warm and dry. No rash noted. She is not diaphoretic. No erythema. No pallor.  Psychiatric: She has a normal mood and affect. Her behavior is normal. Judgment and thought content normal.           Assessment & Plan:

## 2011-04-02 NOTE — Assessment & Plan Note (Signed)
This has resolved.

## 2011-04-06 ENCOUNTER — Telehealth: Payer: Self-pay

## 2011-04-06 NOTE — Telephone Encounter (Signed)
Patient called Paula Meyer stating that she received a results letter in the mail and it said that she should her her MD office back.

## 2011-04-09 ENCOUNTER — Telehealth: Payer: Self-pay | Admitting: *Deleted

## 2011-04-09 MED ORDER — TRAMADOL HCL 50 MG PO TABS
50.0000 mg | ORAL_TABLET | Freq: Four times a day (QID) | ORAL | Status: DC | PRN
Start: 2011-04-09 — End: 2011-04-10

## 2011-04-09 MED ORDER — TRAMADOL HCL 50 MG PO TABS: 50.0000 mg | ORAL_TABLET | Freq: Four times a day (QID) | ORAL | Status: DC | PRN

## 2011-04-09 NOTE — Telephone Encounter (Signed)
Patient informed. 

## 2011-04-09 NOTE — Telephone Encounter (Signed)
Pharmacy  needed.

## 2011-04-09 NOTE — Telephone Encounter (Signed)
Patient requesting rx for tramadol while waiting on pain MD apt 04/20/11. (Was given rx for this int he past from [facility])

## 2011-04-10 ENCOUNTER — Other Ambulatory Visit: Payer: Self-pay | Admitting: *Deleted

## 2011-04-10 MED ORDER — TRAMADOL HCL 50 MG PO TABS: 50.0000 mg | ORAL_TABLET | Freq: Four times a day (QID) | ORAL | Status: DC | PRN

## 2011-04-28 NOTE — Assessment & Plan Note (Signed)
[facility]                         GASTROENTEROLOGY OFFICE NOTE   Paula, Meyer                      MRN:          [id number]  DATE:05/04/2007                            DOB:          [birth year]    REFERRING PHYSICIAN:  Ricki Rodriguez, M.D.   REASON FOR REFERRAL:  Constipation, gas, bloating, left upper quadrant  pain and a history of gastric polyps.   HISTORY OF PRESENT ILLNESS:  Paula Meyer is a 60 year old African  American female referred through the courtesy of Dr. Algie Coffer.  She  relates undergoing an upper endoscopy by Dr. Dortha Kern approximately  10 years ago that showed gastric polyps.  She was advised to return for  followup and repeat endoscopy but she did not do so.  She has had  constipation for many years and generally has a bowel movement about  every 3 to 4 days.  This frequently will require a laxative.  She has  recently used an herbal tea laxative but this has lead to significant  abdominal cramping.  She notes problems with gas and bloating and these  symptoms have worsened since her constipation worsened and she notes her  constipation worsened about the time of menopause.  She has gained about  40 pounds over the past 8 years.  She notes an intermittent mild left  upper quadrant and left lower chest discomfort that seems to worsen when  she is constipated.  She is concerned because she was told of some  unspecified abnormality in her left chest region during echocardiogram  performed in Dr. Roseanne Kaufman office.  She has an uncle with colon cancer,  no other family members with colon cancer, colon polyps or inflammatory  bowel disease.  She has no heartburn, indigestion, dysphagia,  odynophagia, change in stool caliber, melena or hematochezia.   PAST MEDICAL HISTORY:  Hypertension, asthma, hyperlipidemia, arthritis,  anxiety, depression, allergic rhinitis, sleep apnea, status post  bilateral tubal ligation, low back  pain.   CURRENT MEDICATIONS:  Listed on the chart -updated and reviewed.   MEDICATION ALLERGIES:  TETRACYCLINE  leading to GI upset.   SOCIAL HISTORY/REVIEW OF SYSTEMS:  Per the hand written form.   PHYSICAL EXAM:  Overweight African American female in no acute distress.  Height 5 feet 5 inches.  Weight 229 pounds.  Blood pressure is 124/84,  pulse 72 and regular.  HEENT:  Exam anicteric.  Sclerae.  Oropharynx clear.  CHEST:  Clear to auscultation bilaterally with no chest wall tenderness  appreciated.  CARDIAC:  Regular rate and rhythm without murmurs.  ABDOMEN:  Soft, nontender, nondistended.  Normal active bowel sounds.  No palpable organomegaly, masses or hernias.  RECTAL:  Examination deferred to time of colonoscopy.  EXTREMITIES:  Without clubbing, cyanosis or edema.  NEUROLOGIC:  Alert and oriented x3.  Grossly nonfocal.   ASSESSMENT AND PLAN:  Worsening chronic constipation.  Gas and bloating.  Mild left upper quadrant and left lower chest discomfort.  History of  gastric polyps.  We will attempt to obtain records from her prior upper  endoscopy by Dr. Tad Moore.  She is to  be on a high fiber diet and increase  her fluid intake.  She is to discontinue her herbal tea laxative and  begin MiraLax 1 package in 8 ounces of water b.i.d. to t.i.d. as needed  for an adequate bowel movement approximately every day or every other  day.  Risks, benefits and alternatives to colonoscopy and possible  biopsy and possible polypectomy and upper endoscopy with possible biopsy  discussed with the patient and she consents to proceed.  This will be  scheduled electively.  We will obtain a chest x-ray as well as a 2 view  abdominal x-ray today to further evaluate her complaints.     Venita Lick. Russella Dar, MD, [facility]  Electronically Signed    MTS/MedQ  DD: 05/04/2007  DT: 05/04/2007  Job #: [id number]   cc:   Ricki Rodriguez, M.D.

## 2011-05-27 ENCOUNTER — Other Ambulatory Visit: Payer: Self-pay | Admitting: Obstetrics & Gynecology

## 2011-12-15 ENCOUNTER — Other Ambulatory Visit: Payer: Self-pay | Admitting: Internal Medicine

## 2012-05-04 ENCOUNTER — Other Ambulatory Visit (INDEPENDENT_AMBULATORY_CARE_PROVIDER_SITE_OTHER): Payer: BC Managed Care – PPO

## 2012-05-04 ENCOUNTER — Encounter: Payer: Self-pay | Admitting: Internal Medicine

## 2012-05-04 ENCOUNTER — Ambulatory Visit (INDEPENDENT_AMBULATORY_CARE_PROVIDER_SITE_OTHER): Payer: BC Managed Care – PPO | Admitting: Internal Medicine

## 2012-05-04 VITALS — BP 140/72 | HR 62 | Temp 98.2°F | Resp 16 | Wt 224.5 lb

## 2012-05-04 DIAGNOSIS — L259 Unspecified contact dermatitis, unspecified cause: Secondary | ICD-10-CM

## 2012-05-04 DIAGNOSIS — L309 Dermatitis, unspecified: Secondary | ICD-10-CM | POA: Insufficient documentation

## 2012-05-04 DIAGNOSIS — E785 Hyperlipidemia, unspecified: Secondary | ICD-10-CM

## 2012-05-04 DIAGNOSIS — I10 Essential (primary) hypertension: Secondary | ICD-10-CM

## 2012-05-04 DIAGNOSIS — J309 Allergic rhinitis, unspecified: Secondary | ICD-10-CM

## 2012-05-04 DIAGNOSIS — J45909 Unspecified asthma, uncomplicated: Secondary | ICD-10-CM

## 2012-05-04 LAB — CBC WITH DIFFERENTIAL/PLATELET
Basophils Relative: 1.3 % (ref 0.0–3.0)
Eosinophils Absolute: 0.1 10*3/uL (ref 0.0–0.7)
Lymphocytes Relative: 40.9 % (ref 12.0–46.0)
MCHC: 32.6 g/dL (ref 30.0–36.0)
Neutrophils Relative %: 45.7 % (ref 43.0–77.0)
RBC: 4.3 Mil/uL (ref 3.87–5.11)
WBC: 3.8 10*3/uL — ABNORMAL LOW (ref 4.5–10.5)

## 2012-05-04 LAB — URINALYSIS, ROUTINE W REFLEX MICROSCOPIC
Nitrite: NEGATIVE
Specific Gravity, Urine: 1.025 (ref 1.000–1.030)
Total Protein, Urine: NEGATIVE
Urine Glucose: NEGATIVE
Urobilinogen, UA: 0.2 (ref 0.0–1.0)
pH: 6 (ref 5.0–8.0)

## 2012-05-04 LAB — COMPREHENSIVE METABOLIC PANEL
ALT: 13 U/L (ref 0–35)
AST: 18 U/L (ref 0–37)
Albumin: 3.9 g/dL (ref 3.5–5.2)
BUN: 16 mg/dL (ref 6–23)
Calcium: 9.3 mg/dL (ref 8.4–10.5)
Chloride: 101 mEq/L (ref 96–112)
Potassium: 3.3 mEq/L — ABNORMAL LOW (ref 3.5–5.1)
Total Protein: 7.5 g/dL (ref 6.0–8.3)

## 2012-05-04 LAB — LIPID PANEL
Cholesterol: 256 mg/dL — ABNORMAL HIGH (ref 0–200)
Total CHOL/HDL Ratio: 6
Triglycerides: 173 mg/dL — ABNORMAL HIGH (ref 0.0–149.0)

## 2012-05-04 LAB — TSH: TSH: 2.72 u[IU]/mL (ref 0.35–5.50)

## 2012-05-04 LAB — LDL CHOLESTEROL, DIRECT: Direct LDL: 194.7 mg/dL

## 2012-05-04 MED ORDER — MOMETASONE FURO-FORMOTEROL FUM 100-5 MCG/ACT IN AERO: 2.0000 | INHALATION_SPRAY | Freq: Two times a day (BID) | RESPIRATORY_TRACT | Status: DC

## 2012-05-04 MED ORDER — ESTRADIOL-NORETHINDRONE ACET 1-0.5 MG PO TABS: 1.0000 | ORAL_TABLET | Freq: Every day | ORAL | Status: DC

## 2012-05-04 MED ORDER — TRIAMCINOLONE ACETONIDE 0.025 % EX CREA: TOPICAL_CREAM | Freq: Two times a day (BID) | CUTANEOUS | Status: DC

## 2012-05-04 MED ORDER — AZELASTINE HCL 0.15 % NA SOLN: 1.0000 | Freq: Two times a day (BID) | NASAL | Status: DC

## 2012-05-04 MED ORDER — FLUTICASONE PROPIONATE 50 MCG/ACT NA SUSP: 2.0000 | NASAL | Status: DC | PRN

## 2012-05-04 MED ORDER — TELMISARTAN-HCTZ 80-25 MG PO TABS: 1.0000 | ORAL_TABLET | Freq: Every day | ORAL | Status: DC

## 2012-05-04 NOTE — Assessment & Plan Note (Signed)
She will restart her usual meds 

## 2012-05-04 NOTE — Assessment & Plan Note (Signed)
I will recheck her FLP today 

## 2012-05-04 NOTE — Assessment & Plan Note (Signed)
She will try TAC cream 

## 2012-05-04 NOTE — Patient Instructions (Signed)
Hypercholesterolemia High Blood Cholesterol Cholesterol is a white, waxy, fat-like protein needed by your body in small amounts. The liver makes all the cholesterol you need. It is carried from the liver by the blood through the blood vessels. Deposits (plaque) may build up on blood vessel walls. This makes the arteries narrower and stiffer. Plaque increases the risk for heart attack and stroke. You cannot feel your cholesterol level even if it is very high. The only way to know is by a blood test to check your lipid (fats) levels. Once you know your cholesterol levels, you should keep a record of the test results. Work with your caregiver to to keep your levels in the desired range. WHAT THE RESULTS MEAN:  Total cholesterol is a rough measure of all the cholesterol in your blood.   LDL is the so-called bad cholesterol. This is the type that deposits cholesterol in the walls of the arteries. You want this level to be low.   HDL is the good cholesterol because it cleans the arteries and carries the LDL away. You want this level to be high.   Triglycerides are fat that the body can either burn for energy or store. High levels are closely linked to heart disease.  DESIRED LEVELS:  Total cholesterol below 200.   LDL below 100 for people at risk, below 70 for very high risk.   HDL above 50 is good, above 60 is best.   Triglycerides below 150.  HOW TO LOWER YOUR CHOLESTEROL:  Diet.   Choose fish or white meat chicken and turkey, roasted or baked. Limit fatty cuts of red meat, fried foods, and processed meats, such as sausage and lunch meat.   Eat lots of fresh fruits and vegetables. Choose whole grains, beans, pasta, potatoes and cereals.   Use only small amounts of olive, corn or canola oils. Avoid butter, mayonnaise, shortening or palm kernel oils. Avoid foods with trans-fats.   Use skim/nonfat milk and low-fat/nonfat yogurt and cheeses. Avoid whole milk, cream, ice cream, egg yolks and  cheeses. Healthy desserts include angel food cake, gingersnaps, animal crackers, hard candy, popsicles, and low-fat/nonfat frozen yogurt. Avoid pastries, cakes, pies and cookies.   Exercise.   A regular program helps decrease LDL and raises HDL.   Helps with weight control.   Do things that increase your activity level like gardening, walking, or taking the stairs.   Medication.   May be prescribed by your caregiver to help lowering cholesterol and the risk for heart disease.   You may need medicine even if your levels are normal if you have several risk factors.  HOME CARE INSTRUCTIONS   Follow your diet and exercise programs as suggested by your caregiver.   Take medications as directed.   Have blood work done when your caregiver feels it is necessary.  MAKE SURE YOU:   Understand these instructions.   Will watch your condition.   Will get help right away if you are not doing well or get worse.  Document Released: 11/30/2005 Document Revised: 11/19/2011 Document Reviewed: 05/18/2007 [employer] Patient Information 2012 [employer].Hypertension As your heart beats, it forces blood through your arteries. This force is your blood pressure. If the pressure is too high, it is called hypertension (HTN) or high blood pressure. HTN is dangerous because you may have it and not know it. High blood pressure may mean that your heart has to work harder to pump blood. Your arteries may be narrow or stiff. The extra work   puts you at risk for heart disease, stroke, and other problems.  Blood pressure consists of two numbers, a higher number over a lower, 110/72, for example. It is stated as "110 over 72." The ideal is below 120 for the top number (systolic) and under 80 for the bottom (diastolic). Write down your blood pressure today. You should pay close attention to your blood pressure if you have certain conditions such as:  Heart failure.   Prior heart attack.   Diabetes   Chronic  kidney disease.   Prior stroke.   Multiple risk factors for heart disease.  To see if you have HTN, your blood pressure should be measured while you are seated with your arm held at the level of the heart. It should be measured at least twice. A one-time elevated blood pressure reading (especially in the Emergency Department) does not mean that you need treatment. There may be conditions in which the blood pressure is different between your right and left arms. It is important to see your caregiver soon for a recheck. Most people have essential hypertension which means that there is not a specific cause. This type of high blood pressure may be lowered by changing lifestyle factors such as:  Stress.   Smoking.   Lack of exercise.   Excessive weight.   Drug/tobacco/alcohol use.   Eating less salt.  Most people do not have symptoms from high blood pressure until it has caused damage to the body. Effective treatment can often prevent, delay or reduce that damage. TREATMENT  When a cause has been identified, treatment for high blood pressure is directed at the cause. There are a large number of medications to treat HTN. These fall into several categories, and your caregiver will help you select the medicines that are best for you. Medications may have side effects. You should review side effects with your caregiver. If your blood pressure stays high after you have made lifestyle changes or started on medicines,   Your medication(s) may need to be changed.   Other problems may need to be addressed.   Be certain you understand your prescriptions, and know how and when to take your medicine.   Be sure to follow up with your caregiver within the time frame advised (usually within two weeks) to have your blood pressure rechecked and to review your medications.   If you are taking more than one medicine to lower your blood pressure, make sure you know how and at what times they should be taken.  Taking two medicines at the same time can result in blood pressure that is too low.  SEEK IMMEDIATE MEDICAL CARE IF:  You develop a severe headache, blurred or changing vision, or confusion.   You have unusual weakness or numbness, or a faint feeling.   You have severe chest or abdominal pain, vomiting, or breathing problems.  MAKE SURE YOU:   Understand these instructions.   Will watch your condition.   Will get help right away if you are not doing well or get worse.  Document Released: 11/30/2005 Document Revised: 11/19/2011 Document Reviewed: 07/20/2008 Exit[employer] Patient Information 2012 [employer]. 

## 2012-05-04 NOTE — Assessment & Plan Note (Signed)
She will restart dulera

## 2012-05-04 NOTE — Assessment & Plan Note (Signed)
Her BP is not well controlled so I added micardis, I will check her lytes and renal function today

## 2012-05-04 NOTE — Progress Notes (Signed)
Subjective:    Patient ID: Paula Meyer, female    DOB: 09/25/1951, 61 y.o.   MRN: [id number]  Hypertension This is a chronic problem. The current episode started more than 1 year ago. The problem has been gradually worsening since onset. The problem is uncontrolled. Associated symptoms include peripheral edema. Pertinent negatives include no anxiety, blurred vision, chest pain, headaches, malaise/fatigue, neck pain, orthopnea, palpitations, PND, shortness of breath or sweats. Agents associated with hypertension include NSAIDs and decongestants. Past treatments include diuretics. The current treatment provides mild improvement. Compliance problems include exercise and diet.   Asthma She complains of wheezing. There is no chest tightness, cough, difficulty breathing, frequent throat clearing, hemoptysis, hoarse voice, shortness of breath or sputum production. This is a recurrent problem. The current episode started more than 1 month ago. The problem occurs intermittently. The problem has been unchanged. Associated symptoms include postnasal drip, rhinorrhea and sneezing. Pertinent negatives include no appetite change, chest pain, dyspnea on exertion, ear congestion, ear pain, fever, headaches, heartburn, malaise/fatigue, myalgias, nasal congestion, orthopnea, PND, sore throat, sweats, trouble swallowing or weight loss. Her symptoms are aggravated by nothing. Her symptoms are alleviated by beta-agonist. She reports moderate improvement on treatment. Her past medical history is significant for asthma.      Review of Systems  Constitutional: Negative for fever, chills, weight loss, malaise/fatigue, diaphoresis, activity change, appetite change, fatigue and unexpected weight change.  HENT: Positive for congestion, rhinorrhea, sneezing and postnasal drip. Negative for hearing loss, ear pain, nosebleeds, sore throat, hoarse voice, facial swelling, drooling, mouth sores, trouble swallowing, neck pain, neck  stiffness, dental problem, voice change, sinus pressure, tinnitus and ear discharge.   Eyes: Negative.  Negative for blurred vision.  Respiratory: Positive for wheezing. Negative for cough, hemoptysis, sputum production, chest tightness, shortness of breath and stridor.   Cardiovascular: Negative for chest pain, dyspnea on exertion, palpitations, orthopnea, leg swelling and PND.  Gastrointestinal: Negative for heartburn, nausea, vomiting, abdominal pain, diarrhea, constipation and anal bleeding.  Genitourinary: Negative for dysuria, urgency, frequency, hematuria, flank pain, decreased urine volume, enuresis, difficulty urinating and dyspareunia.  Musculoskeletal: Negative for myalgias, back pain, joint swelling, arthralgias and gait problem.  Skin: Positive for rash (dry, itchy skin on her back). Negative for color change, pallor and wound.  Neurological: Negative for dizziness, tremors, seizures, syncope, facial asymmetry, speech difficulty, weakness, light-headedness, numbness and headaches.  Hematological: Negative for adenopathy. Does not bruise/bleed easily.  Psychiatric/Behavioral: Negative.        Objective:   Physical Exam  Vitals reviewed. Constitutional: She is oriented to person, place, and time. She appears well-developed and well-nourished. No distress.  HENT:  Head: Normocephalic and atraumatic.  Mouth/Throat: Oropharynx is clear and moist. No oropharyngeal exudate.  Eyes: Conjunctivae are normal. Right eye exhibits no discharge. Left eye exhibits no discharge. No scleral icterus.  Neck: Normal range of motion. Neck supple. No JVD present. No tracheal deviation present. No thyromegaly present.  Cardiovascular: Normal rate, regular rhythm, normal heart sounds and intact distal pulses.  Exam reveals no gallop and no friction rub.   No murmur heard. Pulmonary/Chest: Breath sounds normal. No stridor. No respiratory distress. She has no wheezes. She has no rales. She exhibits no  tenderness.  Abdominal: Soft. Bowel sounds are normal. She exhibits no distension and no mass. There is no tenderness. There is no rebound and no guarding.  Musculoskeletal: Normal range of motion. She exhibits edema (1+ edema in BLE's). She exhibits no tenderness.  Lymphadenopathy:  She has no cervical adenopathy.  Neurological: She is oriented to person, place, and time.  Skin: Skin is warm and dry. Rash noted. No abrasion, no bruising, no burn, no ecchymosis, no laceration, no lesion, no petechiae and no purpura noted. Rash is papular. Rash is not macular, not maculopapular, not nodular, not pustular, not vesicular and not urticarial. She is not diaphoretic. No cyanosis or erythema. No pallor. Nails show no clubbing.     Psychiatric: She has a normal mood and affect. Her behavior is normal. Judgment and thought content normal.      Lab Results  Component Value Date   WBC 2.9* 04/02/2011   HGB 11.6* 04/02/2011   HCT 34.1* 04/02/2011   PLT 209.0 04/02/2011   GLUCOSE 95 04/02/2011   CHOL 260* 04/02/2011   TRIG 85.0 04/02/2011   HDL 47.10 04/02/2011   LDLDIRECT 204.3 04/02/2011   ALT 16 04/02/2011   AST 18 04/02/2011   NA 141 04/02/2011   K 3.7 04/02/2011   CL 104 04/02/2011   CREATININE 0.7 04/02/2011   BUN 23 04/02/2011   CO2 30 04/02/2011   TSH 1.98 04/02/2011      Assessment & Plan:

## 2012-05-16 ENCOUNTER — Telehealth: Payer: Self-pay | Admitting: Internal Medicine

## 2012-05-16 ENCOUNTER — Emergency Department (HOSPITAL_COMMUNITY): Payer: BC Managed Care – PPO

## 2012-05-16 ENCOUNTER — Emergency Department (HOSPITAL_COMMUNITY)
Admission: EM | Admit: 2012-05-16 | Discharge: 2012-05-16 | Disposition: A | Discharge status: Home / Self Care | Payer: BC Managed Care – PPO | Attending: Emergency Medicine | Admitting: Emergency Medicine

## 2012-05-16 ENCOUNTER — Encounter (HOSPITAL_COMMUNITY): Payer: Self-pay | Admitting: *Deleted

## 2012-05-16 DIAGNOSIS — Y92009 Unspecified place in unspecified non-institutional (private) residence as the place of occurrence of the external cause: Secondary | ICD-10-CM | POA: Insufficient documentation

## 2012-05-16 DIAGNOSIS — J45909 Unspecified asthma, uncomplicated: Secondary | ICD-10-CM | POA: Insufficient documentation

## 2012-05-16 DIAGNOSIS — M1990 Unspecified osteoarthritis, unspecified site: Secondary | ICD-10-CM | POA: Insufficient documentation

## 2012-05-16 DIAGNOSIS — E785 Hyperlipidemia, unspecified: Secondary | ICD-10-CM | POA: Insufficient documentation

## 2012-05-16 DIAGNOSIS — W07XXXA Fall from chair, initial encounter: Secondary | ICD-10-CM | POA: Insufficient documentation

## 2012-05-16 DIAGNOSIS — S7000XA Contusion of unspecified hip, initial encounter: Secondary | ICD-10-CM | POA: Insufficient documentation

## 2012-05-16 DIAGNOSIS — I10 Essential (primary) hypertension: Secondary | ICD-10-CM | POA: Insufficient documentation

## 2012-05-16 MED ORDER — METHOCARBAMOL 500 MG PO TABS
500.0000 mg | ORAL_TABLET | Freq: Once | ORAL | Status: AC
  Administered 2012-05-16: 500 mg via ORAL
  Filled 2012-05-16: qty 1

## 2012-05-16 MED ORDER — METHOCARBAMOL 500 MG PO TABS: 500.0000 mg | ORAL_TABLET | Freq: Two times a day (BID) | ORAL | Status: AC

## 2012-05-16 MED ORDER — IBUPROFEN 200 MG PO TABS
600.0000 mg | ORAL_TABLET | Freq: Once | ORAL | Status: AC
  Administered 2012-05-16: 600 mg via ORAL
  Filled 2012-05-16: qty 3

## 2012-05-16 MED ORDER — TRAMADOL HCL 50 MG PO TABS: 50.0000 mg | ORAL_TABLET | Freq: Four times a day (QID) | ORAL | Status: DC | PRN

## 2012-05-16 MED ORDER — IBUPROFEN 600 MG PO TABS: 600.0000 mg | ORAL_TABLET | Freq: Four times a day (QID) | ORAL | Status: AC | PRN

## 2012-05-16 NOTE — Discharge Instructions (Signed)
Bone Bruise  A bone bruise is a small hidden fracture of the bone. It typically occurs with bones located close to the surface of the skin.  SYMPTOMS  The pain lasts longer than a normal bruise.   The bruised area is difficult to use.   There may be discoloration or swelling of the bruised area.   When a bone bruise is found with injury to the anterior cruciate ligament (in the knee) there is often an increased:   Amount of fluid in the knee   Time the fluid in the knee lasts.   Number of days until you are walking normally and regaining the motion you had before the injury.   Number of days with pain from the injury.  DIAGNOSIS  It can only be seen on X-rays known as MRIs. This stands for magnetic resonance imaging. A regular X-ray taken of a bone bruise would appear to be normal. A bone bruise is a common injury in the knee and the heel bone (calcaneus). The problems are similar to those produced by stress fractures, which are bone injuries caused by overuse. A bone bruise may also be a sign of other injuries. For example, bone bruises are commonly found where an anterior cruciate ligament (ACL) in the knee has been pulled away from the bone (ruptured). A ligament is a tough fibrous material that connects bones together to make our joints stable. Bruises of the bone last a lot longer than bruises of the muscle or tissues beneath the skin. Bone bruises can last from days to months and are often more severe and painful than other bruises. TREATMENT Because bone bruises are sudden injuries you cannot often prevent them, other than by being extremely careful. Some things you can do to improve the condition are:  Apply ice to the sore area for 15 to 20 minutes, 3 to 4 times per day while awake for the first 2 days. Put the ice in a plastic bag, and place a towel between the bag of ice and your skin.   Keep your bruised area raised (elevated) when possible to lessen swelling.   For activity:     Use crutches when necessary; do not put weight on the injured leg until you are no longer tender.   You may walk on your affected part as the pain allows, or as instructed.   Start weight bearing gradually on the bruised part.   Continue to use crutches or a cane until you can stand without causing pain, or as instructed.   If a plaster splint was applied, wear the splint until you are seen for a follow-up examination. Rest it on nothing harder than a pillow the first 24 hours. Do not put weight on it. Do not get it wet. You may take it off to take a shower or bath.   If an air splint was applied, more air may be blown into or out of the splint as needed for comfort. You may take it off at night and to take a shower or bath.   Wiggle your toes in the splint several times per day if you are able.   You may have been given an elastic bandage to use with the plaster splint or alone. The splint is too tight if you have numbness, tingling or if your foot becomes cold and blue. Adjust the bandage to make it comfortable.   Only take over-the-counter or prescription medicines for pain, discomfort, or fever as directed by   discomfort, or fever as directed by your caregiver.   Follow all instructions for follow up with your caregiver. This includes any orthopedic referrals, physical therapy, and rehabilitation. Any delay in obtaining necessary care could result in a delay or failure of the bones to heal.  SEEK MEDICAL CARE IF:    You have an increase in bruising, swelling, or pain.   You notice coldness of your toes.   You do not get pain relief with medications.  SEEK IMMEDIATE MEDICAL CARE IF:    Your toes are numb or blue.   You have severe pain not controlled with medications.   If any of the problems that caused you to seek care are becoming worse.  Document Released: 02/20/2004 Document Revised: 11/19/2011 Document Reviewed: 07/04/2008  [employer] Patient Information 2012 [employer].

## 2012-05-16 NOTE — Telephone Encounter (Signed)
Caller: Eviana/Patient; PCP: Sanda Linger; CB#: [phone]; Call regarding Larey Seat from chair on concrete 05/13/12 .  Pain Right Hip; Felt ok on 5/31; increased pain on 6/1, took Tramadol.  Pain increased 6/3.  Rates pain at 9 of 10, currently 5 of 10 after Tramadol.  Advised ED per Hip Injury protocol. Caller agreed to go to [facility] ED.  Note to provider.

## 2012-05-16 NOTE — ED Notes (Signed)
Patient ambulatory steady gait.

## 2012-05-16 NOTE — ED Notes (Signed)
Pt is here after falling in Friday and was pulled out of chair and landed on right hip.  Hurts to weight on leg

## 2012-05-16 NOTE — ED Provider Notes (Signed)
History  This chart was scribed for Loren Racer, MD by Bennett Scrape. This patient was seen in room STRE4/STRE4 and the patient's care was started at 11:10AM.  CSN: [id number]  Arrival date & time 05/16/12  1024   First MD Initiated Contact with Patient 05/16/12 1110      Chief Complaint  Patient presents with  . Hip Pain    right    Patient is a 61 y.o. female presenting with hip pain. The history is provided by the patient. No language interpreter was used.  Hip Pain This is a new problem. The current episode started more than 2 days ago. The problem occurs constantly. The problem has been gradually worsening. Pertinent negatives include no shortness of breath. The symptoms are aggravated by walking. The symptoms are relieved by lying down.    Paula Meyer is a 61 y.o. female with a h/o asthma, HLD, HTN and OA who presents to the Emergency Department complaining of sudden onset, gradually worsening, constant right hip pain after a fall 3 days ago. Pt states that she was playing tug-of-war with her dog on the patio when she was pulled out of her chair landing on her right hip. She denies head trauma or neck pain. She reports taking tramadol with moderate improvement. The pain is worse with standing and better with sitting. She denies any other symptoms currently. She denies smoking and is an occasional alcohol user.   Past Medical History  Diagnosis Date  . Allergy   . Asthma   . Depression   . Hyperlipidemia   . Hypertension   . Hx of colonic polyps   . Osteoarthritis     History reviewed. No pertinent past surgical history.  Family History  Problem Relation Age of Onset  . Arthritis Other   . Depression Other     History  Substance Use Topics  . Smoking status: Never Smoker   . Smokeless tobacco: Not on file  . Alcohol Use: 1.8 oz/week    3 Glasses of wine per week     rarely     Review of Systems  Constitutional: Negative for fever and chills.    Respiratory: Negative for shortness of breath.   Gastrointestinal: Negative for nausea and vomiting.  Musculoskeletal: Negative for back pain.       Right hip pain  Neurological: Negative for weakness.    Allergies  Bupropion hcl; Contrast media; Duloxetine; and Tetracycline  Home Medications   Current Outpatient Rx  Name Route Sig Dispense Refill  . AZELASTINE HCL 0.15 % NA SOLN Nasal Place 1 Act into the nose 2 (two) times daily. 30 mL 11  . ESTRADIOL-NORETHINDRONE ACET 1-0.5 MG PO TABS Oral Take 1 tablet by mouth daily.    [name] Kitchen FLUTICASONE PROPIONATE 50 MCG/ACT NA SUSP Nasal Place 2 sprays into the nose daily.    . OMEGA-3-ACID ETHYL ESTERS 1 G PO CAPS Oral Take 2 g by mouth daily.    [name] Kitchen OVER THE COUNTER MEDICATION Oral Take 0.5 tablets by mouth as needed. Aleve sinus For cold like symptoms.    . TELMISARTAN-HCTZ 80-25 MG PO TABS Oral Take 1 tablet by mouth daily. 90 tablet 3  . IBUPROFEN 600 MG PO TABS Oral Take 1 tablet (600 mg total) by mouth every 6 (six) hours as needed for pain. 30 tablet 0  . METHOCARBAMOL 500 MG PO TABS Oral Take 1 tablet (500 mg total) by mouth 2 (two) times daily. 20 tablet 0  . TRAMADOL  HCL 50 MG PO TABS Oral Take 1 tablet (50 mg total) by mouth every 6 (six) hours as needed for pain. 15 tablet 0    Triage Vitals: BP 145/82  Pulse 70  Temp(Src) 98.2 F (36.8 C) (Oral)  Resp 20  SpO2 99%  LMP 04/02/2011  Physical Exam  Nursing note and vitals reviewed. Constitutional: She is oriented to person, place, and time. She appears well-developed and well-nourished. No distress.  HENT:  Head: Normocephalic and atraumatic.  Eyes: EOM are normal.  Neck: Neck supple. No tracheal deviation present.  Cardiovascular: Normal rate.   Pulmonary/Chest: Effort normal. No respiratory distress.  Musculoskeletal: Normal range of motion. She exhibits tenderness (over the lateral side of the right greater trochanter ).  Neurological: She is alert and oriented to person,  place, and time.  Skin: Skin is warm and dry.  Psychiatric: She has a normal mood and affect. Her behavior is normal.    ED Course  Procedures (including critical care time)  DIAGNOSTIC STUDIES: Oxygen Saturation is 99% on room air, normal by my interpretation.    COORDINATION OF CARE: 11:48AM-Discussed treatment plan of pain medication and x-ray with pt and pt agreed to plan.  11:50AM-Informed pt of x-ray results.   Labs Reviewed - No data to display Dg Hip Complete Right  05/16/2012  *RADIOLOGY REPORT*  Clinical Data: Pain post fall.  RIGHT HIP - COMPLETE 2+ VIEW  Comparison: None.  Findings: Right pelvic phleboliths.  Sclerosis adjacent to the left sacroiliac joint. Negative for fracture, dislocation, or other acute abnormality.  Normal alignment and mineralization. No other significant degenerative change.  Regional soft tissues unremarkable.  IMPRESSION:  1.  No acute bony abnormality.  Original Report Authenticated By: Osa Craver, M.D.     1. Contusion of hip       MDM  I personally performed the services described in this documentation, which was scribed in my presence. The recorded information has been reviewed and considered.    Loren Racer, MD 05/16/12 [phone]

## 2012-06-29 ENCOUNTER — Telehealth: Payer: Self-pay

## 2012-06-29 DIAGNOSIS — K219 Gastro-esophageal reflux disease without esophagitis: Secondary | ICD-10-CM

## 2012-06-29 NOTE — Telephone Encounter (Signed)
Those are not symptoms associated with these meds

## 2012-06-29 NOTE — Telephone Encounter (Signed)
Pt called stating that Micardis HCT is causing GI upset - nausea and GERD. Pt is requesting alternate medication, please advise.

## 2012-06-30 DIAGNOSIS — K219 Gastro-esophageal reflux disease without esophagitis: Secondary | ICD-10-CM | POA: Insufficient documentation

## 2012-06-30 MED ORDER — RANITIDINE HCL 300 MG PO TABS: 300.0000 mg | ORAL_TABLET | Freq: Every day | ORAL | Status: DC

## 2012-06-30 NOTE — Telephone Encounter (Signed)
Returned call to patient and received a VM stating that the [employer] customer cannot receive VM due to not being set up. Closing phone note until pt calls back

## 2012-06-30 NOTE — Telephone Encounter (Signed)
Start zantac, Rx was sent to her pharmacy

## 2012-06-30 NOTE — Telephone Encounter (Signed)
Any recommendation as to what can help with symptoms.Thanks

## 2012-07-01 ENCOUNTER — Ambulatory Visit (INDEPENDENT_AMBULATORY_CARE_PROVIDER_SITE_OTHER): Payer: BC Managed Care – PPO | Admitting: Internal Medicine

## 2012-07-01 ENCOUNTER — Other Ambulatory Visit (INDEPENDENT_AMBULATORY_CARE_PROVIDER_SITE_OTHER): Payer: BC Managed Care – PPO

## 2012-07-01 ENCOUNTER — Encounter: Payer: Self-pay | Admitting: Internal Medicine

## 2012-07-01 ENCOUNTER — Telehealth: Payer: Self-pay | Admitting: Internal Medicine

## 2012-07-01 VITALS — BP 132/84 | HR 78 | Temp 98.4°F | Resp 16 | Wt 229.0 lb

## 2012-07-01 DIAGNOSIS — E876 Hypokalemia: Secondary | ICD-10-CM

## 2012-07-01 DIAGNOSIS — I10 Essential (primary) hypertension: Secondary | ICD-10-CM

## 2012-07-01 DIAGNOSIS — E785 Hyperlipidemia, unspecified: Secondary | ICD-10-CM

## 2012-07-01 DIAGNOSIS — K219 Gastro-esophageal reflux disease without esophagitis: Secondary | ICD-10-CM

## 2012-07-01 DIAGNOSIS — J309 Allergic rhinitis, unspecified: Secondary | ICD-10-CM

## 2012-07-01 LAB — BASIC METABOLIC PANEL
BUN: 13 mg/dL (ref 6–23)
Chloride: 104 mEq/L (ref 96–112)
Creatinine, Ser: 0.9 mg/dL (ref 0.4–1.2)
GFR: 85.73 mL/min (ref >60.00)

## 2012-07-01 LAB — MAGNESIUM: Magnesium: 2.1 mg/dL (ref 1.5–2.5)

## 2012-07-01 MED ORDER — METHYLPREDNISOLONE ACETATE 80 MG/ML IJ SUSP
120.0000 mg | Freq: Once | INTRAMUSCULAR | Status: AC
  Administered 2012-07-01: 120 mg via INTRAMUSCULAR

## 2012-07-01 MED ORDER — EZETIMIBE-ATORVASTATIN 10-20 MG PO TABS
1.0000 | ORAL_TABLET | Freq: Every day | ORAL | Status: DC
Start: 2012-07-01 — End: 2013-07-10

## 2012-07-01 MED ORDER — HYDROCHLOROTHIAZIDE 25 MG PO TABS: 25.0000 mg | ORAL_TABLET | Freq: Every day | ORAL | Status: DC

## 2012-07-01 NOTE — Patient Instructions (Signed)
Hypercholesterolemia High Blood Cholesterol Cholesterol is a white, waxy, fat-like protein needed by your body in small amounts. The liver makes all the cholesterol you need. It is carried from the liver by the blood through the blood vessels. Deposits (plaque) may build up on blood vessel walls. This makes the arteries narrower and stiffer. Plaque increases the risk for heart attack and stroke. You cannot feel your cholesterol level even if it is very high. The only way to know is by a blood test to check your lipid (fats) levels. Once you know your cholesterol levels, you should keep a record of the test results. Work with your caregiver to to keep your levels in the desired range. WHAT THE RESULTS MEAN:  Total cholesterol is a rough measure of all the cholesterol in your blood.   LDL is the so-called bad cholesterol. This is the type that deposits cholesterol in the walls of the arteries. You want this level to be low.   HDL is the good cholesterol because it cleans the arteries and carries the LDL away. You want this level to be high.   Triglycerides are fat that the body can either burn for energy or store. High levels are closely linked to heart disease.  DESIRED LEVELS:  Total cholesterol below 200.   LDL below 100 for people at risk, below 70 for very high risk.   HDL above 50 is good, above 60 is best.   Triglycerides below 150.  HOW TO LOWER YOUR CHOLESTEROL:  Diet.   Choose fish or white meat chicken and turkey, roasted or baked. Limit fatty cuts of red meat, fried foods, and processed meats, such as sausage and lunch meat.   Eat lots of fresh fruits and vegetables. Choose whole grains, beans, pasta, potatoes and cereals.   Use only small amounts of olive, corn or canola oils. Avoid butter, mayonnaise, shortening or palm kernel oils. Avoid foods with trans-fats.   Use skim/nonfat milk and low-fat/nonfat yogurt and cheeses. Avoid whole milk, cream, ice cream, egg yolks and  cheeses. Healthy desserts include angel food cake, gingersnaps, animal crackers, hard candy, popsicles, and low-fat/nonfat frozen yogurt. Avoid pastries, cakes, pies and cookies.   Exercise.   A regular program helps decrease LDL and raises HDL.   Helps with weight control.   Do things that increase your activity level like gardening, walking, or taking the stairs.   Medication.   May be prescribed by your caregiver to help lowering cholesterol and the risk for heart disease.   You may need medicine even if your levels are normal if you have several risk factors.  HOME CARE INSTRUCTIONS   Follow your diet and exercise programs as suggested by your caregiver.   Take medications as directed.   Have blood work done when your caregiver feels it is necessary.  MAKE SURE YOU:   Understand these instructions.   Will watch your condition.   Will get help right away if you are not doing well or get worse.  Document Released: 11/30/2005 Document Revised: 11/19/2011 Document Reviewed: 05/18/2007 [employer] Patient Information 2012 [employer].Hypertension As your heart beats, it forces blood through your arteries. This force is your blood pressure. If the pressure is too high, it is called hypertension (HTN) or high blood pressure. HTN is dangerous because you may have it and not know it. High blood pressure may mean that your heart has to work harder to pump blood. Your arteries may be narrow or stiff. The extra work   puts you at risk for heart disease, stroke, and other problems.  Blood pressure consists of two numbers, a higher number over a lower, 110/72, for example. It is stated as "110 over 72." The ideal is below 120 for the top number (systolic) and under 80 for the bottom (diastolic). Write down your blood pressure today. You should pay close attention to your blood pressure if you have certain conditions such as:  Heart failure.   Prior heart attack.   Diabetes   Chronic  kidney disease.   Prior stroke.   Multiple risk factors for heart disease.  To see if you have HTN, your blood pressure should be measured while you are seated with your arm held at the level of the heart. It should be measured at least twice. A one-time elevated blood pressure reading (especially in the Emergency Department) does not mean that you need treatment. There may be conditions in which the blood pressure is different between your right and left arms. It is important to see your caregiver soon for a recheck. Most people have essential hypertension which means that there is not a specific cause. This type of high blood pressure may be lowered by changing lifestyle factors such as:  Stress.   Smoking.   Lack of exercise.   Excessive weight.   Drug/tobacco/alcohol use.   Eating less salt.  Most people do not have symptoms from high blood pressure until it has caused damage to the body. Effective treatment can often prevent, delay or reduce that damage. TREATMENT  When a cause has been identified, treatment for high blood pressure is directed at the cause. There are a large number of medications to treat HTN. These fall into several categories, and your caregiver will help you select the medicines that are best for you. Medications may have side effects. You should review side effects with your caregiver. If your blood pressure stays high after you have made lifestyle changes or started on medicines,   Your medication(s) may need to be changed.   Other problems may need to be addressed.   Be certain you understand your prescriptions, and know how and when to take your medicine.   Be sure to follow up with your caregiver within the time frame advised (usually within two weeks) to have your blood pressure rechecked and to review your medications.   If you are taking more than one medicine to lower your blood pressure, make sure you know how and at what times they should be taken.  Taking two medicines at the same time can result in blood pressure that is too low.  SEEK IMMEDIATE MEDICAL CARE IF:  You develop a severe headache, blurred or changing vision, or confusion.   You have unusual weakness or numbness, or a faint feeling.   You have severe chest or abdominal pain, vomiting, or breathing problems.  MAKE SURE YOU:   Understand these instructions.   Will watch your condition.   Will get help right away if you are not doing well or get worse.  Document Released: 11/30/2005 Document Revised: 11/19/2011 Document Reviewed: 07/20/2008 Exit[employer] Patient Information 2012 [employer]. 

## 2012-07-01 NOTE — Progress Notes (Signed)
Subjective:    Patient ID: Paula Meyer, female    DOB: March 04, 1951, 61 y.o.   MRN: [id number]  Hypertension This is a chronic problem. The current episode started more than 1 year ago. The problem has been gradually improving since onset. The problem is controlled. Pertinent negatives include no chest pain, headaches, neck pain, palpitations or shortness of breath. Agents associated with hypertension include estrogens. Past treatments include angiotensin blockers and diuretics. The current treatment provides moderate improvement. Compliance problems include psychosocial issues, exercise and diet.   Gastrophageal Reflux She complains of heartburn. She reports no abdominal pain, no belching, no chest pain, no choking, no coughing, no dysphagia, no early satiety, no globus sensation, no hoarse voice, no nausea, no sore throat, no stridor, no tooth decay, no water brash or no wheezing. This is a recurrent problem. The current episode started more than 1 month ago. The problem occurs occasionally. The problem has been unchanged. The heartburn duration is several minutes. The heartburn is located in the substernum. The heartburn is of mild intensity. The heartburn does not wake her from sleep. The heartburn does not limit her activity. The heartburn doesn't change with position. Pertinent negatives include no anemia, fatigue, melena, muscle weakness, orthopnea or weight loss. She has tried a histamine-2 antagonist for the symptoms. The treatment provided moderate relief.      Review of Systems  Constitutional: Negative for fever, chills, weight loss, diaphoresis, activity change, appetite change, fatigue and unexpected weight change.  HENT: Positive for congestion, rhinorrhea, sneezing and postnasal drip. Negative for hearing loss, ear pain, nosebleeds, sore throat, hoarse voice, facial swelling, drooling, mouth sores, trouble swallowing, neck pain, neck stiffness, dental problem, voice change, sinus  pressure, tinnitus and ear discharge.   Eyes: Negative.   Respiratory: Negative for cough, choking, chest tightness, shortness of breath, wheezing and stridor.   Cardiovascular: Negative for chest pain, palpitations and leg swelling.  Gastrointestinal: Positive for heartburn. Negative for dysphagia, nausea, vomiting, abdominal pain, diarrhea, constipation, blood in stool, melena and abdominal distention.  Genitourinary: Negative.   Musculoskeletal: Negative for myalgias, back pain, joint swelling, arthralgias, gait problem and muscle weakness.  Skin: Negative for color change, pallor, rash and wound.  Neurological: Negative for dizziness, tremors, seizures, syncope, facial asymmetry, speech difficulty, weakness, light-headedness, numbness and headaches.  Hematological: Negative for adenopathy. Does not bruise/bleed easily.  Psychiatric/Behavioral: Negative.        Objective:   Physical Exam  Vitals reviewed. Constitutional: She is oriented to person, place, and time. She appears well-developed and well-nourished. No distress.  HENT:  Head: Normocephalic and atraumatic.  Right Ear: Hearing, tympanic membrane, external ear and ear canal normal.  Left Ear: Hearing, tympanic membrane, external ear and ear canal normal.  Nose: Mucosal edema present. No rhinorrhea, sinus tenderness, nasal deformity, septal deviation or nasal septal hematoma. No epistaxis.  No foreign bodies. Right sinus exhibits no maxillary sinus tenderness and no frontal sinus tenderness. Left sinus exhibits no maxillary sinus tenderness and no frontal sinus tenderness.  Mouth/Throat: Oropharynx is clear and moist. No oropharyngeal exudate.  Eyes: Conjunctivae are normal. Right eye exhibits no discharge. Left eye exhibits no discharge. No scleral icterus.  Neck: Normal range of motion. Neck supple. No JVD present. No tracheal deviation present. No thyromegaly present.  Cardiovascular: Normal rate, regular rhythm, normal heart  sounds and intact distal pulses.  Exam reveals no gallop and no friction rub.   No murmur heard. Pulmonary/Chest: Effort normal and breath sounds normal. No stridor. No  respiratory distress. She has no wheezes. She has no rales. She exhibits no tenderness.  Abdominal: Soft. Bowel sounds are normal. She exhibits no distension and no mass. There is no tenderness. There is no rebound and no guarding.  Musculoskeletal: Normal range of motion. She exhibits edema (trace edema in BLE). She exhibits no tenderness.  Lymphadenopathy:    She has no cervical adenopathy.  Neurological: She is oriented to person, place, and time.  Skin: Skin is warm and dry. No rash noted. She is not diaphoretic. No erythema. No pallor.  Psychiatric: She has a normal mood and affect. Her behavior is normal. Judgment and thought content normal.      Lab Results  Component Value Date   WBC 3.8* 05/04/2012   HGB 11.7* 05/04/2012   HCT 35.8* 05/04/2012   PLT 197.0 05/04/2012   GLUCOSE 86 05/04/2012   CHOL 256* 05/04/2012   TRIG 173.0* 05/04/2012   HDL 42.30 05/04/2012   LDLDIRECT 194.7 05/04/2012   ALT 13 05/04/2012   AST 18 05/04/2012   NA 139 05/04/2012   K 3.3* 05/04/2012   CL 101 05/04/2012   CREATININE 0.9 05/04/2012   BUN 16 05/04/2012   CO2 30 05/04/2012   TSH 2.72 05/04/2012      Assessment & Plan:

## 2012-07-01 NOTE — Telephone Encounter (Signed)
Caller: Lekia/Patient; PCP: Sanda Linger; CB#: [phone]; ; ; Call regarding Not Able To Tolerate Bp Medication; has been taking new BP medication for about a month, but is having nausea, swelling of hands and feet.  Patient stopped the medication a week ago.  Would like Dr. Yetta Barre to "just put me on a plain diuretic until I can see him to discuss this."   BP has been running < 180/120.  Per protocol, emergent symptoms denied; advised appt within 72 hours.  Appt sched 07/01/12 1530 with Dr. Yetta Barre.

## 2012-07-03 ENCOUNTER — Encounter: Payer: Self-pay | Admitting: Internal Medicine

## 2012-07-03 NOTE — Assessment & Plan Note (Signed)
She will continue zantac as needed

## 2012-07-03 NOTE — Assessment & Plan Note (Signed)
Her symptoms are not well controlled so I gave her an injection of depo-medrol IM

## 2012-07-03 NOTE — Assessment & Plan Note (Signed)
At her request I will change her to only HCTZ and will see how well that controls her blood pressure

## 2012-07-03 NOTE — Assessment & Plan Note (Signed)
She has a very high LDL so I have asked her to start liptruzet to reduce her risk of CVA and CAD

## 2012-07-03 NOTE — Assessment & Plan Note (Signed)
I will check her K+ and Mg++ levels today 

## 2012-08-22 ENCOUNTER — Telehealth: Payer: Self-pay | Admitting: Internal Medicine

## 2012-08-22 NOTE — Telephone Encounter (Signed)
Caller: [name]/Patient; Patient Name: Paula Meyer; PCP: Sanda Linger (Adults only); Best Callback Phone Number: [phone]; Reason for call: Insists on appointment ASAP.  Afebrile/subjective.  Premenopausal, depression for a couple weeks.  Relates she has hot flashes and sleep problems, depressed, decreased energy.  Onset sx unclear; worse for  2 weeks.     Emergent sx ruled out.  Home care for the interim and parameters for callback given per Menopause, Dx or Suspected protocol and Depression protocol.   Appointment wit Dr. Yetta Barre on 08/23/12 @ 10:15.

## 2012-08-23 ENCOUNTER — Encounter: Payer: Self-pay | Admitting: Internal Medicine

## 2012-08-23 ENCOUNTER — Ambulatory Visit (INDEPENDENT_AMBULATORY_CARE_PROVIDER_SITE_OTHER): Payer: BC Managed Care – PPO | Admitting: Internal Medicine

## 2012-08-23 ENCOUNTER — Other Ambulatory Visit (INDEPENDENT_AMBULATORY_CARE_PROVIDER_SITE_OTHER): Payer: BC Managed Care – PPO

## 2012-08-23 VITALS — BP 130/82 | HR 72 | Temp 98.6°F | Resp 16 | Wt 222.5 lb

## 2012-08-23 DIAGNOSIS — M5137 Other intervertebral disc degeneration, lumbosacral region: Secondary | ICD-10-CM

## 2012-08-23 DIAGNOSIS — F329 Major depressive disorder, single episode, unspecified: Secondary | ICD-10-CM

## 2012-08-23 DIAGNOSIS — D649 Anemia, unspecified: Secondary | ICD-10-CM

## 2012-08-23 DIAGNOSIS — I10 Essential (primary) hypertension: Secondary | ICD-10-CM

## 2012-08-23 DIAGNOSIS — D539 Nutritional anemia, unspecified: Secondary | ICD-10-CM | POA: Insufficient documentation

## 2012-08-23 DIAGNOSIS — F3289 Other specified depressive episodes: Secondary | ICD-10-CM

## 2012-08-23 DIAGNOSIS — R7309 Other abnormal glucose: Secondary | ICD-10-CM

## 2012-08-23 DIAGNOSIS — M1990 Unspecified osteoarthritis, unspecified site: Secondary | ICD-10-CM

## 2012-08-23 DIAGNOSIS — M51379 Other intervertebral disc degeneration, lumbosacral region without mention of lumbar back pain or lower extremity pain: Secondary | ICD-10-CM

## 2012-08-23 DIAGNOSIS — D519 Vitamin B12 deficiency anemia, unspecified: Secondary | ICD-10-CM

## 2012-08-23 DIAGNOSIS — M5136 Other intervertebral disc degeneration, lumbar region: Secondary | ICD-10-CM

## 2012-08-23 DIAGNOSIS — E876 Hypokalemia: Secondary | ICD-10-CM

## 2012-08-23 DIAGNOSIS — D518 Other vitamin B12 deficiency anemias: Secondary | ICD-10-CM

## 2012-08-23 DIAGNOSIS — M51369 Other intervertebral disc degeneration, lumbar region without mention of lumbar back pain or lower extremity pain: Secondary | ICD-10-CM

## 2012-08-23 LAB — VITAMIN B12: Vitamin B-12: 271 pg/mL (ref 211–911)

## 2012-08-23 LAB — CBC WITH DIFFERENTIAL/PLATELET
Basophils Absolute: 0.1 10*3/uL (ref 0.0–0.1)
Eosinophils Relative: 1.5 % (ref 0.0–5.0)
Lymphocytes Relative: 37.7 % (ref 12.0–46.0)
Monocytes Relative: 8.3 % (ref 3.0–12.0)
Neutrophils Relative %: 50.9 % (ref 43.0–77.0)
Platelets: 223 10*3/uL (ref 150.0–400.0)
RDW: 14.5 % (ref 11.5–14.6)
WBC: 3.6 10*3/uL — ABNORMAL LOW (ref 4.5–10.5)

## 2012-08-23 LAB — BASIC METABOLIC PANEL
BUN: 13 mg/dL (ref 6–23)
CO2: 31 mEq/L (ref 19–32)
Chloride: 101 mEq/L (ref 96–112)
Creatinine, Ser: 0.9 mg/dL (ref 0.4–1.2)
Glucose, Bld: 102 mg/dL — ABNORMAL HIGH (ref 70–99)

## 2012-08-23 LAB — IBC PANEL: Iron: 80 ug/dL (ref 42–145)

## 2012-08-23 LAB — HEMOGLOBIN A1C: Hgb A1c MFr Bld: 6.1 % (ref 4.6–6.5)

## 2012-08-23 LAB — FOLATE: Folate: 9.9 ng/mL (ref >5.9)

## 2012-08-23 LAB — FERRITIN: Ferritin: 110.3 ng/mL (ref 10.0–291.0)

## 2012-08-23 LAB — TSH: TSH: 2.56 u[IU]/mL (ref 0.35–5.50)

## 2012-08-23 MED ORDER — DICLOFENAC SODIUM ER 100 MG PO TB24: 100.0000 mg | ORAL_TABLET | Freq: Every day | ORAL | Status: DC

## 2012-08-23 MED ORDER — ALPRAZOLAM 0.25 MG PO TABS: 0.5000 mg | ORAL_TABLET | Freq: Every evening | ORAL | Status: DC | PRN

## 2012-08-23 MED ORDER — DULOXETINE HCL 30 MG PO CPEP: 30.0000 mg | ORAL_CAPSULE | Freq: Every day | ORAL | Status: DC

## 2012-08-23 NOTE — Patient Instructions (Signed)
Depression, Adolescent and Adult Depression is a true and treatable medical condition. In general there are two kinds of depression:  Depression we all experience in some form. For example depression from the death of a loved one, financial distress or natural disasters will trigger or increase depression.   Clinical depression, on the other hand, appears without an apparent cause or reason. This depression is a disease. Depression may be caused by chemical imbalance in the body and brain or may come as a response to a physical illness. Alcohol and other drugs can cause depression.  DIAGNOSIS  The diagnosis of depression is usually based upon symptoms and medical history. TREATMENT  Treatments for depression fall into three categories. These are:  Drug therapy. There are many medicines that treat depression. Responses may vary and sometimes trial and error is necessary to determine the best medicines and dosage for a particular patient.   Psychotherapy, also called talking treatments, helps people resolve their problems by looking at them from a different point of view and by giving people insight into their own personal makeup. Traditional psychotherapy looks at a childhood source of a problem. Other psychotherapy will look at current conflicts and move toward solving those. If the cause of depression is drug use, counseling is available to help abstain. In time the depression will usually improve. If there were underlying causes for the chemical use, they can be addressed.   ECT (electroconvulsive therapy) or shock treatment is not as commonly used today. It is a very effective treatment for severe suicidal depression. During ECT electrical impulses are applied to the head. These impulses cause a generalized seizure. It can be effective but causes a loss of memory for recent events. Sometimes this loss of memory may include the last several months.  Treat all depression or suicide threats as  serious. Obtain professional help. Do not wait to see if serious depression will get better over time without help. Seek help for yourself or those around you. In the [place] the number to the National Suicide Help Lines With 24 Hour Help Are: 1-800-SUICIDE 332-517-5396 Document Released: 11/27/2000 Document Revised: 11/19/2011 Document Reviewed: 07/18/2008 [facility] Patient Information 2012 [place], [place].Anemia, Nonspecific Your exam and blood tests show you are anemic. This means your blood (hemoglobin) level is low. Normal hemoglobin values are 12 to 15 g/dL for females and 14 to 17 g/dL for males. Make a note of your hemoglobin level today. The hematocrit percent is also used to measure anemia. A normal hematocrit is 38% to 46% in females and 42% to 49% in males. Make a note of your hematocrit level today. CAUSES  Anemia can be due to many different causes.  Excessive bleeding from periods (in women).   Intestinal bleeding.   Poor nutrition.   Kidney, thyroid, liver, and bone marrow diseases.  SYMPTOMS  Anemia can come on suddenly (acute). It can also come on slowly. Symptoms can include:  Minor weakness.   Dizziness.   Palpitations.   Shortness of breath.  Symptoms may be absent until half your hemoglobin is missing if it comes on slowly. Anemia due to acute blood loss from an injury or internal bleeding may require blood transfusion if the loss is severe. Hospital care is needed if you are anemic and there is significant continual blood loss. TREATMENT   Stool tests for blood (Hemoccult) and additional lab tests are often needed. This determines the best treatment.   Further checking on your condition and your response to treatment is  very important. It often takes many weeks to correct anemia.  Depending on the cause, treatment can include:  Supplements of iron.   Vitamins B12 and folic acid.   Hormone medicines.If your anemia is due to bleeding, finding the cause of the  blood loss is very important. This will help avoid further problems.  SEEK IMMEDIATE MEDICAL CARE IF:   You develop fainting, extreme weakness, shortness of breath, or chest pain.   You develop heavy vaginal bleeding.   You develop bloody or black, tarry stools or vomit up blood.   You develop a high fever, rash, repeated vomiting, or dehydration.  Document Released: 01/07/2005 Document Revised: 11/19/2011 Document Reviewed: 10/15/2009 [facility] Patient Information 2012 [place], [place].

## 2012-08-23 NOTE — Progress Notes (Signed)
Subjective:    Patient ID: Paula Meyer, female    DOB: 1951/03/21, 61 y.o.   MRN: [id number]  Anemia Presents for follow-up visit. Symptoms include malaise/fatigue and paresthesias. There has been no abdominal pain, anorexia, bruising/bleeding easily, confusion, fever, leg swelling, light-headedness, pallor, palpitations, pica or weight loss. Signs of blood loss that are not present include hematemesis, hematochezia, melena and vaginal bleeding. There are no compliance problems.       Review of Systems  Constitutional: Positive for malaise/fatigue, fatigue and unexpected weight change. Negative for fever, chills, weight loss, diaphoresis, activity change and appetite change.  HENT: Negative.  Facial swelling: weight gain.   Eyes: Negative.   Respiratory: Negative for apnea, cough, chest tightness, shortness of breath, wheezing and stridor.   Cardiovascular: Negative for chest pain, palpitations and leg swelling.  Gastrointestinal: Negative for nausea, vomiting, abdominal pain, diarrhea, constipation, blood in stool, melena, hematochezia, abdominal distention, anal bleeding, rectal pain, anorexia and hematemesis.  Genitourinary: Negative.  Negative for vaginal bleeding.  Musculoskeletal: Positive for back pain (chronic, unchanged) and arthralgias. Negative for myalgias, joint swelling and gait problem.  Skin: Negative for color change, pallor, rash and wound.  Neurological: Positive for numbness (and tingling in hands and feet) and paresthesias. Negative for dizziness, tremors, seizures, syncope, facial asymmetry, speech difficulty, weakness, light-headedness and headaches.  Hematological: Negative for adenopathy. Does not bruise/bleed easily.  Psychiatric/Behavioral: Positive for disturbed wake/sleep cycle and dysphoric mood. Negative for suicidal ideas, hallucinations, behavioral problems, confusion, self-injury, decreased concentration and agitation. The patient is nervous/anxious. The  patient is not hyperactive.        Objective:   Physical Exam  Vitals reviewed. Constitutional: She is oriented to person, place, and time. She appears well-developed and well-nourished. No distress.  HENT:  Head: Normocephalic and atraumatic.  Mouth/Throat: Oropharynx is clear and moist. No oropharyngeal exudate.  Eyes: Conjunctivae normal and EOM are normal. Pupils are equal, round, and reactive to light. Right eye exhibits no discharge. Left eye exhibits no discharge. No scleral icterus.  Neck: Normal range of motion. Neck supple. No JVD present. No tracheal deviation present. No thyromegaly present.  Cardiovascular: Normal rate, regular rhythm, normal heart sounds and intact distal pulses.  Exam reveals no gallop and no friction rub.   No murmur heard. Pulmonary/Chest: Effort normal and breath sounds normal. No stridor. No respiratory distress. She has no wheezes. She has no rales. She exhibits no tenderness.  Abdominal: Soft. Bowel sounds are normal. She exhibits no distension and no mass. There is no tenderness. There is no rebound and no guarding.  Musculoskeletal: Normal range of motion. She exhibits no edema and no tenderness.  Lymphadenopathy:    She has no cervical adenopathy.  Neurological: She is alert and oriented to person, place, and time. She has normal reflexes. She displays normal reflexes. No cranial nerve deficit. She exhibits normal muscle tone. Coordination normal.  Skin: Skin is warm and dry. No rash noted. She is not diaphoretic. No erythema. No pallor.  Psychiatric: Her speech is normal and behavior is normal. Judgment and thought content normal. Her mood appears anxious. Her affect is not angry, not blunt, not labile and not inappropriate. Cognition and memory are normal. She exhibits a depressed mood.      Lab Results  Component Value Date   WBC 3.8* 05/04/2012   HGB 11.7* 05/04/2012   HCT 35.8* 05/04/2012   PLT 197.0 05/04/2012   GLUCOSE 107* 07/01/2012    CHOL 256* 05/04/2012   TRIG 173.0*  05/04/2012   HDL 42.30 05/04/2012   LDLDIRECT 194.7 05/04/2012   ALT 13 05/04/2012   AST 18 05/04/2012   NA 140 07/01/2012   K 3.5 07/01/2012   CL 104 07/01/2012   CREATININE 0.9 07/01/2012   BUN 13 07/01/2012   CO2 29 07/01/2012   TSH 2.72 05/04/2012      Assessment & Plan:

## 2012-08-24 ENCOUNTER — Encounter: Payer: Self-pay | Admitting: Internal Medicine

## 2012-08-24 DIAGNOSIS — D519 Vitamin B12 deficiency anemia, unspecified: Secondary | ICD-10-CM | POA: Insufficient documentation

## 2012-08-24 MED ORDER — CYANOCOBALAMIN 250 MCG PO TABS: 250.0000 ug | ORAL_TABLET | Freq: Every day | ORAL | Status: AC

## 2012-08-24 NOTE — Assessment & Plan Note (Signed)
CBC and vitamin levels today 

## 2012-08-24 NOTE — Assessment & Plan Note (Signed)
Continue diclofenac as needed 

## 2012-08-24 NOTE — Assessment & Plan Note (Signed)
Her BP is well controlled 

## 2012-08-24 NOTE — Assessment & Plan Note (Signed)
I will check her a1c today to see if she has DM II 

## 2012-08-24 NOTE — Assessment & Plan Note (Signed)
She has had a good response to cymbalta in the past so I have asked her to restart it

## 2012-08-24 NOTE — Assessment & Plan Note (Signed)
Start oral B12 replacement 

## 2012-08-24 NOTE — Assessment & Plan Note (Signed)
I will recheck her K+ level today 

## 2012-10-10 ENCOUNTER — Encounter: Payer: Self-pay | Admitting: Internal Medicine

## 2012-10-10 ENCOUNTER — Ambulatory Visit (INDEPENDENT_AMBULATORY_CARE_PROVIDER_SITE_OTHER): Payer: BC Managed Care – PPO | Admitting: Internal Medicine

## 2012-10-10 VITALS — BP 118/66 | HR 80 | Temp 98.2°F | Resp 16 | Wt 222.0 lb

## 2012-10-10 DIAGNOSIS — I10 Essential (primary) hypertension: Secondary | ICD-10-CM

## 2012-10-10 DIAGNOSIS — F329 Major depressive disorder, single episode, unspecified: Secondary | ICD-10-CM

## 2012-10-10 MED ORDER — PAROXETINE HCL 20 MG PO TABS: 20.0000 mg | ORAL_TABLET | ORAL | Status: DC

## 2012-10-10 NOTE — Progress Notes (Signed)
  Subjective:    Patient ID: Paula Meyer, female    DOB: August 13, 1951, 61 y.o.   MRN: [id number]  HPI  She returns for f/up and she tells me that she does not like the way cymbalta makes her feel, she has had a good response to paxil in the last and she wants to try that again, also she wants some time off from work to adjust to her new med.  Review of Systems  Constitutional: Negative for fever, chills, diaphoresis, activity change, appetite change, fatigue and unexpected weight change.  HENT: Negative.   Eyes: Negative.   Respiratory: Negative for cough, chest tightness, shortness of breath, wheezing and stridor.   Cardiovascular: Negative for chest pain, palpitations and leg swelling.  Gastrointestinal: Negative.   Genitourinary: Negative.   Skin: Negative.   Neurological: Negative.   Hematological: Negative.   Psychiatric/Behavioral: Positive for disturbed wake/sleep cycle and dysphoric mood. Negative for suicidal ideas, hallucinations, behavioral problems, confusion, self-injury, decreased concentration and agitation. The patient is nervous/anxious. The patient is not hyperactive.        Objective:   Physical Exam  Vitals reviewed. Constitutional: She is oriented to person, place, and time. She appears well-developed and well-nourished. No distress.  HENT:  Head: Normocephalic and atraumatic.  Mouth/Throat: Oropharynx is clear and moist. No oropharyngeal exudate.  Eyes: Conjunctivae normal are normal. Right eye exhibits no discharge. Left eye exhibits no discharge. No scleral icterus.  Neck: Normal range of motion. Neck supple. No JVD present. No tracheal deviation present. No thyromegaly present.  Cardiovascular: Normal rate, regular rhythm, normal heart sounds and intact distal pulses.  Exam reveals no gallop and no friction rub.   No murmur heard. Pulmonary/Chest: Effort normal and breath sounds normal. No stridor. No respiratory distress. She has no wheezes. She has no  rales. She exhibits no tenderness.  Abdominal: Soft. Bowel sounds are normal. She exhibits no distension and no mass. There is no tenderness. There is no rebound and no guarding.  Musculoskeletal: Normal range of motion. She exhibits no edema and no tenderness.  Lymphadenopathy:    She has no cervical adenopathy.  Neurological: She is oriented to person, place, and time.  Skin: Skin is warm and dry. No rash noted. She is not diaphoretic. No erythema. No pallor.  Psychiatric: She has a normal mood and affect. Her speech is normal and behavior is normal. Judgment and thought content normal. Cognition and memory are normal.     Lab Results  Component Value Date   WBC 3.6* 08/23/2012   HGB 12.0 08/23/2012   HCT 37.1 08/23/2012   PLT 223.0 08/23/2012   GLUCOSE 102* 08/23/2012   CHOL 256* 05/04/2012   TRIG 173.0* 05/04/2012   HDL 42.30 05/04/2012   LDLDIRECT 194.7 05/04/2012   ALT 13 05/04/2012   AST 18 05/04/2012   NA 139 08/23/2012   K 3.5 08/23/2012   CL 101 08/23/2012   CREATININE 0.9 08/23/2012   BUN 13 08/23/2012   CO2 31 08/23/2012   TSH 2.56 08/23/2012   HGBA1C 6.1 08/23/2012       Assessment & Plan:

## 2012-10-10 NOTE — Assessment & Plan Note (Signed)
Her BP is well controlled 

## 2012-10-10 NOTE — Patient Instructions (Signed)

## 2012-10-10 NOTE — Assessment & Plan Note (Signed)
Will change cymbalta to paxil, out of work for one week

## 2012-12-12 ENCOUNTER — Other Ambulatory Visit: Payer: Self-pay | Admitting: Internal Medicine

## 2013-07-10 ENCOUNTER — Ambulatory Visit (INDEPENDENT_AMBULATORY_CARE_PROVIDER_SITE_OTHER): Payer: BC Managed Care – PPO | Admitting: Internal Medicine

## 2013-07-10 ENCOUNTER — Encounter: Payer: Self-pay | Admitting: Internal Medicine

## 2013-07-10 ENCOUNTER — Ambulatory Visit (INDEPENDENT_AMBULATORY_CARE_PROVIDER_SITE_OTHER): Payer: BC Managed Care – PPO

## 2013-07-10 VITALS — BP 150/90 | HR 65 | Temp 98.6°F | Resp 16 | Ht 65.0 in | Wt 211.0 lb

## 2013-07-10 DIAGNOSIS — D519 Vitamin B12 deficiency anemia, unspecified: Secondary | ICD-10-CM

## 2013-07-10 DIAGNOSIS — M1990 Unspecified osteoarthritis, unspecified site: Secondary | ICD-10-CM

## 2013-07-10 DIAGNOSIS — J45909 Unspecified asthma, uncomplicated: Secondary | ICD-10-CM

## 2013-07-10 DIAGNOSIS — E785 Hyperlipidemia, unspecified: Secondary | ICD-10-CM

## 2013-07-10 DIAGNOSIS — I10 Essential (primary) hypertension: Secondary | ICD-10-CM

## 2013-07-10 DIAGNOSIS — M722 Plantar fascial fibromatosis: Secondary | ICD-10-CM

## 2013-07-10 DIAGNOSIS — R7309 Other abnormal glucose: Secondary | ICD-10-CM

## 2013-07-10 DIAGNOSIS — D518 Other vitamin B12 deficiency anemias: Secondary | ICD-10-CM

## 2013-07-10 DIAGNOSIS — M5136 Other intervertebral disc degeneration, lumbar region: Secondary | ICD-10-CM

## 2013-07-10 DIAGNOSIS — Z1231 Encounter for screening mammogram for malignant neoplasm of breast: Secondary | ICD-10-CM | POA: Insufficient documentation

## 2013-07-10 DIAGNOSIS — M5137 Other intervertebral disc degeneration, lumbosacral region: Secondary | ICD-10-CM

## 2013-07-10 DIAGNOSIS — Z23 Encounter for immunization: Secondary | ICD-10-CM

## 2013-07-10 LAB — LIPID PANEL
HDL: 43.1 mg/dL (ref >39.00)
Total CHOL/HDL Ratio: 6
Triglycerides: 111 mg/dL (ref 0.0–149.0)

## 2013-07-10 LAB — COMPREHENSIVE METABOLIC PANEL
CO2: 31 mEq/L (ref 19–32)
Creatinine, Ser: 0.9 mg/dL (ref 0.4–1.2)
GFR: 86.57 mL/min (ref >60.00)
Glucose, Bld: 89 mg/dL (ref 70–99)
Total Bilirubin: 0.5 mg/dL (ref 0.3–1.2)

## 2013-07-10 LAB — CBC WITH DIFFERENTIAL/PLATELET
Basophils Relative: 0.8 % (ref 0.0–3.0)
Eosinophils Relative: 1.5 % (ref 0.0–5.0)
HCT: 35.8 % — ABNORMAL LOW (ref 36.0–46.0)
Hemoglobin: 11.9 g/dL — ABNORMAL LOW (ref 12.0–15.0)
Lymphs Abs: 1.8 10*3/uL (ref 0.7–4.0)
Monocytes Relative: 6.1 % (ref 3.0–12.0)
Neutro Abs: 2.2 10*3/uL (ref 1.4–7.7)
RBC: 4.36 Mil/uL (ref 3.87–5.11)
WBC: 4.4 10*3/uL — ABNORMAL LOW (ref 4.5–10.5)

## 2013-07-10 LAB — URINALYSIS, ROUTINE W REFLEX MICROSCOPIC
Bilirubin Urine: NEGATIVE
Hgb urine dipstick: NEGATIVE
Nitrite: NEGATIVE
Total Protein, Urine: NEGATIVE

## 2013-07-10 LAB — VITAMIN B12: Vitamin B-12: 358 pg/mL (ref 211–911)

## 2013-07-10 MED ORDER — OLMESARTAN MEDOXOMIL-HCTZ 40-25 MG PO TABS: 1.0000 | ORAL_TABLET | Freq: Every day | ORAL | Status: DC

## 2013-07-10 MED ORDER — DICLOFENAC SODIUM ER 100 MG PO TB24: 100.0000 mg | ORAL_TABLET | Freq: Every day | ORAL | Status: DC

## 2013-07-10 MED ORDER — TRAMADOL HCL 50 MG PO TABS: 50.0000 mg | ORAL_TABLET | Freq: Four times a day (QID) | ORAL | Status: AC | PRN

## 2013-07-10 MED ORDER — BUDESONIDE-FORMOTEROL FUMARATE 80-4.5 MCG/ACT IN AERO: 2.0000 | INHALATION_SPRAY | Freq: Two times a day (BID) | RESPIRATORY_TRACT | Status: DC

## 2013-07-10 MED ORDER — CYANOCOBALAMIN 500 MCG/0.1ML NA SOLN: 0.1000 mL | NASAL | Status: DC

## 2013-07-10 NOTE — Patient Instructions (Signed)
Hypertension As your heart beats, it forces blood through your arteries. This force is your blood pressure. If the pressure is too high, it is called hypertension (HTN) or high blood pressure. HTN is dangerous because you may have it and not know it. High blood pressure may mean that your heart has to work harder to pump blood. Your arteries may be narrow or stiff. The extra work puts you at risk for heart disease, stroke, and other problems.  Blood pressure consists of two numbers, a higher number over a lower, 110/72, for example. It is stated as "110 over 72." The ideal is below 120 for the top number (systolic) and under 80 for the bottom (diastolic). Write down your blood pressure today. You should pay close attention to your blood pressure if you have certain conditions such as:  Heart failure.  Prior heart attack.  Diabetes  Chronic kidney disease.  Prior stroke.  Multiple risk factors for heart disease. To see if you have HTN, your blood pressure should be measured while you are seated with your arm held at the level of the heart. It should be measured at least twice. A one-time elevated blood pressure reading (especially in the Emergency Department) does not mean that you need treatment. There may be conditions in which the blood pressure is different between your right and left arms. It is important to see your caregiver soon for a recheck. Most people have essential hypertension which means that there is not a specific cause. This type of high blood pressure may be lowered by changing lifestyle factors such as:  Stress.  Smoking.  Lack of exercise.  Excessive weight.  Drug/tobacco/alcohol use.  Eating less salt. Most people do not have symptoms from high blood pressure until it has caused damage to the body. Effective treatment can often prevent, delay or reduce that damage. TREATMENT  When a cause has been identified, treatment for high blood pressure is directed at the  cause. There are a large number of medications to treat HTN. These fall into several categories, and your caregiver will help you select the medicines that are best for you. Medications may have side effects. You should review side effects with your caregiver. If your blood pressure stays high after you have made lifestyle changes or started on medicines,   Your medication(s) may need to be changed.  Other problems may need to be addressed.  Be certain you understand your prescriptions, and know how and when to take your medicine.  Be sure to follow up with your caregiver within the time frame advised (usually within two weeks) to have your blood pressure rechecked and to review your medications.  If you are taking more than one medicine to lower your blood pressure, make sure you know how and at what times they should be taken. Taking two medicines at the same time can result in blood pressure that is too low. SEEK IMMEDIATE MEDICAL CARE IF:  You develop a severe headache, blurred or changing vision, or confusion.  You have unusual weakness or numbness, or a faint feeling.  You have severe chest or abdominal pain, vomiting, or breathing problems. MAKE SURE YOU:   Understand these instructions.  Will watch your condition.  Will get help right away if you are not doing well or get worse. Document Released: 11/30/2005 Document Revised: 02/22/2012 Document Reviewed: 07/20/2008 [facility] Patient Information 2014 [place], [place]. Plantar Fasciitis Plantar fasciitis is a common condition that causes foot pain. It is soreness (inflammation)  of the band of tough fibrous tissue on the bottom of the foot that runs from the heel bone (calcaneus) to the ball of the foot. The cause of this soreness may be from excessive standing, poor fitting shoes, running on hard surfaces, being overweight, having an abnormal walk, or overuse (this is common in runners) of the painful foot or feet. It is also common  in aerobic exercise dancers and ballet dancers. SYMPTOMS  Most people with plantar fasciitis complain of:  Severe pain in the morning on the bottom of their foot especially when taking the first steps out of bed. This pain recedes after a few minutes of walking.  Severe pain is experienced also during walking following a long period of inactivity.  Pain is worse when walking barefoot or up stairs DIAGNOSIS   Your caregiver will diagnose this condition by examining and feeling your foot.  Special tests such as X-rays of your foot, are usually not needed. PREVENTION   Consult a sports medicine professional before beginning a new exercise program.  Walking programs offer a good workout. With walking there is a lower chance of overuse injuries common to runners. There is less impact and less jarring of the joints.  Begin all new exercise programs slowly. If problems or pain develop, decrease the amount of time or distance until you are at a comfortable level.  Wear good shoes and replace them regularly.  Stretch your foot and the heel cords at the back of the ankle (Achilles tendon) both before and after exercise.  Run or exercise on even surfaces that are not hard. For example, asphalt is better than pavement.  Do not run barefoot on hard surfaces.  If using a treadmill, vary the incline.  Do not continue to workout if you have foot or joint problems. Seek professional help if they do not improve. HOME CARE INSTRUCTIONS   Avoid activities that cause you pain until you recover.  Use ice or cold packs on the problem or painful areas after working out.  Only take over-the-counter or prescription medicines for pain, discomfort, or fever as directed by your caregiver.  Soft shoe inserts or athletic shoes with air or gel sole cushions may be helpful.  If problems continue or become more severe, consult a sports medicine caregiver or your own health care provider. Cortisone is a  potent anti-inflammatory medication that may be injected into the painful area. You can discuss this treatment with your caregiver. MAKE SURE YOU:   Understand these instructions.  Will watch your condition.  Will get help right away if you are not doing well or get worse. Document Released: 08/25/2001 Document Revised: 02/22/2012 Document Reviewed: 10/24/2008 [facility] Patient Information 2014 [place], [place].

## 2013-07-11 ENCOUNTER — Encounter: Payer: Self-pay | Admitting: Internal Medicine

## 2013-07-11 DIAGNOSIS — M722 Plantar fascial fibromatosis: Secondary | ICD-10-CM | POA: Insufficient documentation

## 2013-07-11 LAB — LDL CHOLESTEROL, DIRECT: Direct LDL: 188.6 mg/dL

## 2013-07-11 MED ORDER — ATORVASTATIN CALCIUM 80 MG PO TABS: 80.0000 mg | ORAL_TABLET | Freq: Every day | ORAL | Status: DC

## 2013-07-11 NOTE — Assessment & Plan Note (Signed)
Her BP is not well controlled I am concerned that she has high renin HTN after being on HCTZ so I have added Benicar Today I will check her labs to look for secondary causes and end organ damage

## 2013-07-11 NOTE — Assessment & Plan Note (Signed)
Start nascobal Check CBC and B12 level today

## 2013-07-11 NOTE — Assessment & Plan Note (Addendum)
I will recheck her FLP today  Late note, her LDL is very high - I have asked her to start lipitor

## 2013-07-11 NOTE — Assessment & Plan Note (Signed)
I will check her A1C to see if she has developed DM2 

## 2013-07-11 NOTE — Progress Notes (Signed)
Subjective:    Patient ID: Paula Meyer, female    DOB: Jul 04, 1951, 62 y.o.   MRN: [id number]  Hypertension This is a chronic problem. The current episode started more than 1 year ago. The problem has been gradually worsening since onset. The problem is uncontrolled. Associated symptoms include peripheral edema. Pertinent negatives include no anxiety, blurred vision, chest pain, headaches, malaise/fatigue, neck pain, orthopnea, palpitations, PND, shortness of breath or sweats. Agents associated with hypertension include estrogens and NSAIDs. Past treatments include diuretics. The current treatment provides mild improvement. Compliance problems include exercise and diet.       Review of Systems  Constitutional: Negative.  Negative for fever, chills, malaise/fatigue, diaphoresis, activity change, appetite change, fatigue and unexpected weight change.  HENT: Negative for neck pain.   Eyes: Negative.  Negative for blurred vision.  Respiratory: Positive for wheezing (occasional wheezing). Negative for apnea, cough, choking, chest tightness, shortness of breath and stridor.   Cardiovascular: Negative.  Negative for chest pain, palpitations, orthopnea, leg swelling and PND.  Gastrointestinal: Negative.  Negative for nausea, vomiting, abdominal pain, diarrhea and constipation.  Endocrine: Negative.   Genitourinary: Negative.   Musculoskeletal: Positive for arthralgias (knees and left foot (over the heel)). Negative for myalgias, back pain, joint swelling and gait problem.  Skin: Negative.  Negative for color change, pallor, rash and wound.  Allergic/Immunologic: Negative.   Neurological: Negative for dizziness and headaches.  Hematological: Negative.  Negative for adenopathy. Does not bruise/bleed easily.  Psychiatric/Behavioral: Negative.        Objective:   Physical Exam  Vitals reviewed. Constitutional: She is oriented to person, place, and time. She appears well-developed and  well-nourished. No distress.  HENT:  Head: Normocephalic and atraumatic.  Mouth/Throat: Oropharynx is clear and moist. No oropharyngeal exudate.  Eyes: Conjunctivae are normal. Right eye exhibits no discharge. Left eye exhibits no discharge. No scleral icterus.  Neck: Normal range of motion. Neck supple. No JVD present. No tracheal deviation present. No thyromegaly present.  Cardiovascular: Normal rate, regular rhythm, normal heart sounds and intact distal pulses.  Exam reveals no gallop and no friction rub.   No murmur heard. Pulmonary/Chest: Breath sounds normal. No stridor. No respiratory distress. She has no wheezes. She has no rales. She exhibits no tenderness.  Abdominal: Soft. Bowel sounds are normal. She exhibits no distension and no mass. There is no tenderness. There is no rebound and no guarding.  Musculoskeletal: Normal range of motion. She exhibits edema (trace pitting edema in BLE). She exhibits no tenderness.       Right knee: Normal.       Left knee: Normal.       Left foot: Normal. She exhibits normal range of motion, no tenderness, no bony tenderness, no swelling, normal capillary refill, no crepitus, no deformity and no laceration.  Lymphadenopathy:    She has no cervical adenopathy.  Neurological: She is oriented to person, place, and time.  Skin: Skin is warm and dry. No rash noted. She is not diaphoretic. No erythema. No pallor.  Psychiatric: She has a normal mood and affect. Her behavior is normal. Judgment and thought content normal.     Lab Results  Component Value Date   WBC 3.6* 08/23/2012   HGB 12.0 08/23/2012   HCT 37.1 08/23/2012   PLT 223.0 08/23/2012   GLUCOSE 102* 08/23/2012   CHOL 256* 05/04/2012   TRIG 173.0* 05/04/2012   HDL 42.30 05/04/2012   LDLDIRECT 194.7 05/04/2012   ALT 13 05/04/2012  AST 18 05/04/2012   NA 139 08/23/2012   K 3.5 08/23/2012   CL 101 08/23/2012   CREATININE 0.9 08/23/2012   BUN 13 08/23/2012   CO2 31 08/23/2012   TSH 2.56 08/23/2012    HGBA1C 6.1 08/23/2012       Assessment & Plan:

## 2013-07-11 NOTE — Assessment & Plan Note (Signed)
She will continue the current meds for pain relief 

## 2013-07-11 NOTE — Addendum Note (Signed)
Addended by: Etta Grandchild on: 07/11/2013 10:03 AM   Modules accepted: Orders

## 2013-07-11 NOTE — Assessment & Plan Note (Signed)
She was given pt ed material about treatment for this She will continue tramadol and nsaids for pain relief

## 2013-07-11 NOTE — Assessment & Plan Note (Signed)
She has been using pro-air for symptom relief I have asked her to add symbicort as well

## 2013-08-17 ENCOUNTER — Other Ambulatory Visit: Payer: Self-pay | Admitting: Internal Medicine

## 2013-08-17 DIAGNOSIS — I10 Essential (primary) hypertension: Secondary | ICD-10-CM

## 2013-08-18 MED ORDER — OLMESARTAN MEDOXOMIL-HCTZ 40-25 MG PO TABS: 1.0000 | ORAL_TABLET | Freq: Every day | ORAL | Status: DC

## 2013-08-23 MED ORDER — OLMESARTAN MEDOXOMIL-HCTZ 40-25 MG PO TABS: 1.0000 | ORAL_TABLET | Freq: Every day | ORAL | Status: DC

## 2013-08-23 NOTE — Addendum Note (Signed)
Addended by: Rock Nephew T on: 08/23/2013 10:32 AM   Modules accepted: Orders

## 2013-09-09 ENCOUNTER — Other Ambulatory Visit: Payer: Self-pay | Admitting: Internal Medicine

## 2013-10-19 ENCOUNTER — Other Ambulatory Visit: Payer: Self-pay

## 2013-11-04 ENCOUNTER — Other Ambulatory Visit: Payer: Self-pay | Admitting: Internal Medicine

## 2013-11-10 ENCOUNTER — Other Ambulatory Visit: Payer: Self-pay | Admitting: Internal Medicine

## 2013-11-13 ENCOUNTER — Other Ambulatory Visit: Payer: Self-pay | Admitting: Internal Medicine

## 2013-11-17 ENCOUNTER — Encounter: Payer: Self-pay | Admitting: Internal Medicine

## 2013-11-17 MED ORDER — POTASSIUM CHLORIDE CRYS ER 20 MEQ PO TBCR: 20.0000 meq | EXTENDED_RELEASE_TABLET | Freq: Three times a day (TID) | ORAL | Status: DC

## 2014-01-12 ENCOUNTER — Encounter (HOSPITAL_COMMUNITY): Payer: Self-pay | Admitting: Emergency Medicine

## 2014-01-12 ENCOUNTER — Emergency Department (HOSPITAL_COMMUNITY)
Admission: EM | Admit: 2014-01-12 | Discharge: 2014-01-13 | Disposition: A | Discharge status: Home / Self Care | Payer: BC Managed Care – PPO | Attending: Emergency Medicine | Admitting: Emergency Medicine

## 2014-01-12 DIAGNOSIS — Z791 Long term (current) use of non-steroidal anti-inflammatories (NSAID): Secondary | ICD-10-CM | POA: Insufficient documentation

## 2014-01-12 DIAGNOSIS — L0291 Cutaneous abscess, unspecified: Secondary | ICD-10-CM

## 2014-01-12 DIAGNOSIS — J45909 Unspecified asthma, uncomplicated: Secondary | ICD-10-CM | POA: Insufficient documentation

## 2014-01-12 DIAGNOSIS — M1990 Unspecified osteoarthritis, unspecified site: Secondary | ICD-10-CM | POA: Insufficient documentation

## 2014-01-12 DIAGNOSIS — F329 Major depressive disorder, single episode, unspecified: Secondary | ICD-10-CM | POA: Insufficient documentation

## 2014-01-12 DIAGNOSIS — R42 Dizziness and giddiness: Secondary | ICD-10-CM | POA: Insufficient documentation

## 2014-01-12 DIAGNOSIS — H60399 Other infective otitis externa, unspecified ear: Secondary | ICD-10-CM | POA: Insufficient documentation

## 2014-01-12 DIAGNOSIS — Z79899 Other long term (current) drug therapy: Secondary | ICD-10-CM | POA: Insufficient documentation

## 2014-01-12 DIAGNOSIS — I10 Essential (primary) hypertension: Secondary | ICD-10-CM | POA: Insufficient documentation

## 2014-01-12 DIAGNOSIS — E785 Hyperlipidemia, unspecified: Secondary | ICD-10-CM | POA: Insufficient documentation

## 2014-01-12 DIAGNOSIS — F3289 Other specified depressive episodes: Secondary | ICD-10-CM | POA: Insufficient documentation

## 2014-01-12 MED ORDER — CLINDAMYCIN HCL 150 MG PO CAPS: 300.0000 mg | ORAL_CAPSULE | Freq: Three times a day (TID) | ORAL | Status: DC

## 2014-01-12 NOTE — ED Notes (Signed)
Pt states she has a painful area behind her R ear. States it feels like a pressure behind her R ear. Symptoms since Wed off and on. Pt states area behind R ear is tender to palpate. Area feels swollen. Pt states she was dizzy earlier, but it went away. Pt alert, no acute distress.

## 2014-01-12 NOTE — ED Provider Notes (Signed)
63 year old female comes in with a painful knot behind her right ear which started today. There is a small area of swelling and tenderness but no definite fluctuance. It is posterior to where the postauricular lymph nodes would be expected and superior to the mastoids. This will be treated as a possible early abscess with antibiotics and is advised to return should the area start to swell more. Otherwise, followup with PCP.  Medical screening examination/treatment/procedure(s) were conducted as a shared visit with non-physician practitioner(s) and myself.  I personally evaluated the patient during the encounter.   Delora Fuel, MD 68/34/19 [id number]

## 2014-01-12 NOTE — ED Provider Notes (Signed)
CSN: [id number]     Arrival date & time 01/12/14  2208 History   First MD Initiated Contact with Patient 01/12/14 2321     This chart was scribed for Paula Meyer, by Lovena Le Day, ED scribe. This patient was seen in room [place] and the patient's care was started at 2321.  Chief Complaint  Patient presents with  . Pain behind ear    The history is provided by the patient. No language interpreter was used.   HPI Comments: Paula Meyer is a 63 y.o. female who presents to the Emergency Department complaining of throbbing pain to area of skin just posterior of her right ear, no injury/trauma, onset today. She states it is tender to touch and hurts when she rotates her head to the right or when she is laying down. She reports she was mildly dizzy today, no LOC or HA. She denies neck stiffness, fever/chills, ear pain/swelling/itchiness/drainage, drooling/inability to swallow, vision changes/vision loss, numbness/weakness of her arms/legs, nausea, or vomiting. She has baseline nasal congestion and L ear tinnitus. She denies recent URI or ear infection.  Past Medical History  Diagnosis Date  . Allergy   . Asthma   . Depression   . Hyperlipidemia   . Hypertension   . Hx of colonic polyps   . Osteoarthritis    History reviewed. No pertinent past surgical history. Family History  Problem Relation Age of Onset  . Arthritis Other   . Depression Other    History  Substance Use Topics  . Smoking status: Never Smoker   . Smokeless tobacco: Never Used  . Alcohol Use: 1.8 oz/week    3 Glasses of wine per week     Comment: rarely   OB History   Grav Para Term Preterm Abortions TAB SAB Ect Mult Living                 Review of Systems  Constitutional: Negative for fever and chills.  HENT: Negative for congestion, drooling, ear pain and trouble swallowing.        Pain just posterior to her right ear  Eyes: Negative for visual disturbance.  Respiratory: Negative for cough and shortness of  breath.   Cardiovascular: Negative for chest pain.  Gastrointestinal: Negative for nausea, vomiting and abdominal pain.  Musculoskeletal: Negative for back pain, neck pain and neck stiffness.  Skin: Negative for color change and rash.  Neurological: Negative for syncope, weakness, numbness and headaches.  All other systems reviewed and are negative.   A complete 10 system review of systems was obtained and all systems are negative except as noted in the HPI and PMH.   Allergies  Bupropion hcl; Contrast media; and Tetracycline  Home Medications   Current Outpatient Rx  Name  Route  Sig  Dispense  Refill  . ALPRAZolam (XANAX) 0.25 MG tablet   Oral   Take 0.25 mg by mouth at bedtime as needed for anxiety.         . Azelastine HCl (ASTEPRO) 0.15 % SOLN   Nasal   Place 1 Act into the nose 2 (two) times daily.   30 mL   11   . Diclofenac Sodium CR 100 MG 24 hr tablet   Oral   Take 1 tablet (100 mg total) by mouth daily.   90 tablet   1   . estradiol-norethindrone (MIMVEY) 1-0.5 MG per tablet   Oral   Take 1 tablet by mouth daily.         [name] Kitchen  hydrochlorothiazide (HYDRODIURIL) 25 MG tablet   Oral   Take 25 mg by mouth daily.         [name] Kitchen PARoxetine (PAXIL) 20 MG tablet   Oral   Take 10 mg by mouth at bedtime.         . potassium chloride SA (K-DUR,KLOR-CON) 20 MEQ tablet   Oral   Take 10 mEq by mouth. As needed         . Senna-Psyllium (PERDIEM PO)   Oral   Take 3 tablets by mouth once.         . traMADol (ULTRAM) 50 MG tablet   Oral   Take 50 mg by mouth every 6 (six) hours as needed for moderate pain.         . clindamycin (CLEOCIN) 150 MG capsule   Oral   Take 2 capsules (300 mg total) by mouth 3 (three) times daily. May dispense as 150mg  capsules   42 capsule   0   . PARoxetine (PAXIL) 20 MG tablet   Oral   Take 1 tablet (20 mg total) by mouth every morning.   90 tablet   3    Triage Vitals: BP 132/80  Pulse 74  Temp(Src) 98.6 F (37 C)  (Oral)  Resp 16  SpO2 98%  LMP 04/02/2011  Physical Exam  Nursing note and vitals reviewed. Constitutional: She is oriented to person, place, and time. She appears well-developed and well-nourished. No distress.  HENT:  Head: Normocephalic and atraumatic.  Right Ear: Tympanic membrane, external ear and ear canal normal. No drainage. No mastoid tenderness. No decreased hearing is noted.  Left Ear: Tympanic membrane, external ear and ear canal normal. No drainage. No mastoid tenderness. No decreased hearing is noted.  Nose: Nose normal.  Mouth/Throat: Uvula is midline, oropharynx is clear and moist and mucous membranes are normal.  No tenderness, swelling, or redness to the mastoid process bilaterally. Patient with tenderness to palpation behind her right ear with associated soft tissue swelling. Area of tenderness is posterior and slightly inferior to mastoid process. No associated heat to touch, red linear streaking, or erythema appreciated.  Eyes: Conjunctivae and EOM are normal. Pupils are equal, round, and reactive to light. No scleral icterus.  Neck: Normal range of motion. Neck supple.  No nuchal rigidity or meningismus  Cardiovascular: Normal rate, regular rhythm and intact distal pulses.   Distal radial pulse 2+ in RUE.  Pulmonary/Chest: Effort normal. No respiratory distress.  Musculoskeletal: Normal range of motion.  Neurological: She is alert and oriented to person, place, and time. No cranial nerve deficit.  GCS 15. Patient speaks in full goal oriented sentences. She moves extremities without ataxia. She is ambulatory with normal gait.  Skin: Skin is warm and dry. No rash noted. She is not diaphoretic. No erythema. No pallor.  Psychiatric: She has a normal mood and affect. Her behavior is normal.    ED Course  Procedures (including critical care time) DIAGNOSTIC STUDIES: Oxygen Saturation is 98% on room air, normal by my interpretation.     Labs Review Labs Reviewed - No  data to display Imaging Review No results found.  EKG Interpretation   None       MDM   1. Abscess    Patient presents for posterior auricular tenderness of her R ear onset this AM. Patient as well and nontoxic appearing, hemodynamically stable, and afebrile. No mastoid swelling, erythema, or tenderness bilaterally. External ear, canal, and tympanic membrane are unremarkable bilaterally. No  focal neurologic deficits appreciated. There does appear to be a small area of soft tissue swelling and tenderness. This most likely represents an early soft tissue infection or abscess. Will place patient on course of clindamycin for symptoms have advised warm compresses and ibuprofen for discomfort. Recheck advised in 48 hours. Return precautions provided and patient agreeable to plan with no unaddressed concerns.  Patient seen also by Dr. Roxanne Mins who is in agreement with this workup, assessment, management plan, and patient's stability for discharge.  I personally performed the services described in this documentation, which was scribed in my presence. The recorded information has been reviewed and is accurate.     Paula Breach, PA-C 01/12/14 2345

## 2014-01-12 NOTE — Discharge Instructions (Signed)
Take the antibiotic as prescribed. Take ibuprofen, 600 mg every 6 hours as needed for pain. Apply warm compresses to the area at least 4 times a day to promote healing. Followup with your primary care provider for a recheck in 48 hours. Return to the emergency department if symptoms worsen.  Abscess An abscess (boil or furuncle) is an infected area on or under the skin. This area is filled with yellowish-white fluid (pus) and other material (debris). HOME CARE   Only take medicines as told by your doctor.  If you were given antibiotic medicine, take it as directed. Finish the medicine even if you start to feel better.  If gauze is used, follow your doctor's directions for changing the gauze.  To avoid spreading the infection:  Keep your abscess covered with a bandage.  Wash your hands well.  Do not share personal care items, towels, or whirlpools with others.  Avoid skin contact with others.  Keep your skin and clothes clean around the abscess.  Keep all doctor visits as told. GET HELP RIGHT AWAY IF:   You have more pain, puffiness (swelling), or redness in the wound site.  You have more fluid or blood coming from the wound site.  You have muscle aches, chills, or you feel sick.  You have a fever. MAKE SURE YOU:   Understand these instructions.  Will watch your condition.  Will get help right away if you are not doing well or get worse. Document Released: 05/18/2008 Document Revised: 05/31/2012 Document Reviewed: 02/12/2012 [facility] Patient Information 2014 [place].  Abscess Care After An abscess (also called a boil or furuncle) is an infected area that contains a collection of pus. Signs and symptoms of an abscess include pain, tenderness, redness, or hardness, or you may feel a moveable soft area under your skin. An abscess can occur anywhere in the body. The infection may spread to surrounding tissues causing cellulitis. A cut (incision) by the surgeon was  made over your abscess and the pus was drained out. Gauze may have been packed into the space to provide a drain that will allow the cavity to heal from the inside outwards. The boil may be painful for 5 to 7 days. Most people with a boil do not have high fevers. Your abscess, if seen early, may not have localized, and may not have been lanced. If not, another appointment may be required for this if it does not get better on its own or with medications. HOME CARE INSTRUCTIONS   Only take over-the-counter or prescription medicines for pain, discomfort, or fever as directed by your caregiver.  When you bathe, soak and then remove gauze or iodoform packs at least daily or as directed by your caregiver. You may then wash the wound gently with mild soapy water. Repack with gauze or do as your caregiver directs. SEEK IMMEDIATE MEDICAL CARE IF:   You develop increased pain, swelling, redness, drainage, or bleeding in the wound site.  You develop signs of generalized infection including muscle aches, chills, fever, or a general ill feeling.  An oral temperature above 102 F (38.9 C) develops, not controlled by medication. See your caregiver for a recheck if you develop any of the symptoms described above. If medications (antibiotics) were prescribed, take them as directed. Document Released: 06/18/2005 Document Revised: 02/22/2012 Document Reviewed: 02/13/2008 [facility] Patient Information 2014 [place].

## 2014-01-13 MED ORDER — OXYCODONE-ACETAMINOPHEN 5-325 MG PO TABS
1.0000 | ORAL_TABLET | Freq: Once | ORAL | Status: AC
  Administered 2014-01-13: 1 via ORAL
  Filled 2014-01-13: qty 1

## 2014-02-24 ENCOUNTER — Ambulatory Visit (INDEPENDENT_AMBULATORY_CARE_PROVIDER_SITE_OTHER): Payer: BC Managed Care – PPO | Admitting: Emergency Medicine

## 2014-02-24 VITALS — BP 118/76 | HR 71 | Temp 98.5°F | Resp 18 | Ht 65.0 in | Wt 216.0 lb

## 2014-02-24 DIAGNOSIS — M479 Spondylosis, unspecified: Secondary | ICD-10-CM

## 2014-02-24 MED ORDER — CYCLOBENZAPRINE HCL 5 MG PO TABS: 5.0000 mg | ORAL_TABLET | Freq: Three times a day (TID) | ORAL | Status: DC | PRN

## 2014-02-24 MED ORDER — NAPROXEN SODIUM 550 MG PO TABS: 550.0000 mg | ORAL_TABLET | Freq: Two times a day (BID) | ORAL | Status: DC

## 2014-02-24 NOTE — Patient Instructions (Signed)
Back Pain, Adult Low back pain is very common. About 1 in 5 people have back pain.The cause of low back pain is rarely dangerous. The pain often gets better over time.About half of people with a sudden onset of back pain feel better in just 2 weeks. About 8 in 10 people feel better by 6 weeks.  CAUSES Some common causes of back pain include:  Strain of the muscles or ligaments supporting the spine.  Wear and tear (degeneration) of the spinal discs.  Arthritis.  Direct injury to the back. DIAGNOSIS Most of the time, the direct cause of low back pain is not known.However, back pain can be treated effectively even when the exact cause of the pain is unknown.Answering your caregiver's questions about your overall health and symptoms is one of the most accurate ways to make sure the cause of your pain is not dangerous. If your caregiver needs more information, he or she may order lab work or imaging tests (X-rays or MRIs).However, even if imaging tests show changes in your back, this usually does not require surgery. HOME CARE INSTRUCTIONS For many people, back pain returns.Since low back pain is rarely dangerous, it is often a condition that people can learn to manageon their own.   Remain active. It is stressful on the back to sit or stand in one place. Do not sit, drive, or stand in one place for more than 30 minutes at a time. Take short walks on level surfaces as soon as pain allows.Try to increase the length of time you walk each day.  Do not stay in bed.Resting more than 1 or 2 days can delay your recovery.  Do not avoid exercise or work.Your body is made to move.It is not dangerous to be active, even though your back may hurt.Your back will likely heal faster if you return to being active before your pain is gone.  Pay attention to your body when you bend and lift. Many people have less discomfortwhen lifting if they bend their knees, keep the load close to their bodies,and  avoid twisting. Often, the most comfortable positions are those that put less stress on your recovering back.  Find a comfortable position to sleep. Use a firm mattress and lie on your side with your knees slightly bent. If you lie on your back, put a pillow under your knees.  Only take over-the-counter or prescription medicines as directed by your caregiver. Over-the-counter medicines to reduce pain and inflammation are often the most helpful.Your caregiver may prescribe muscle relaxant drugs.These medicines help dull your pain so you can more quickly return to your normal activities and healthy exercise.  Put ice on the injured area.  Put ice in a plastic bag.  Place a towel between your skin and the bag.  Leave the ice on for 15-20 minutes, 03-04 times a day for the first 2 to 3 days. After that, ice and heat may be alternated to reduce pain and spasms.  Ask your caregiver about trying back exercises and gentle massage. This may be of some benefit.  Avoid feeling anxious or stressed.Stress increases muscle tension and can worsen back pain.It is important to recognize when you are anxious or stressed and learn ways to manage it.Exercise is a great option. SEEK MEDICAL CARE IF:  You have pain that is not relieved with rest or medicine.  You have pain that does not improve in 1 week.  You have new symptoms.  You are generally not feeling well. SEEK   IMMEDIATE MEDICAL CARE IF:   You have pain that radiates from your back into your legs.  You develop new bowel or bladder control problems.  You have unusual weakness or numbness in your arms or legs.  You develop nausea or vomiting.  You develop abdominal pain.  You feel faint. Document Released: 11/30/2005 Document Revised: 05/31/2012 Document Reviewed: 04/20/2011 Exit[employer] Patient Information 2014 [employer].  

## 2014-02-24 NOTE — Progress Notes (Signed)
[facility] [address] [place] [address] [phone]  Date:  02/24/2014   Name:  Paula Meyer   DOB:  08/25/1951   MRN:  [id number]  PCP:  Scarlette Calico, MD    Chief Complaint: Hip Pain and Back Pain   History of Present Illness:  Paula Meyer is a 63 y.o. very pleasant female patient who presents with the following:  Known to have DJD L3-4, 4-5, and 5-S1. Last imaged in 2011.  Has chronic sacral low back pain.  Seems stable.  Now has 3 week duration pain in left lateral thigh from the hip.  Worse when gets up from sitting or laying.  No history of injury.  No neuro symptoms or weakness.  No improvement with diclofenac.  No improvement with over the counter medications or other home remedies. Denies other complaint or health concern today.   Patient Active Problem List   Diagnosis Date Noted  . Plantar fasciitis of left foot 07/11/2013  . Other screening mammogram 07/10/2013  . B12 deficiency anemia 08/24/2012  . Other abnormal glucose 08/23/2012  . GERD (gastroesophageal reflux disease) 06/30/2012  . Eczema 05/04/2012  . DDD (degenerative disc disease), lumbar 04/02/2011  . Hyperlipidemia LDL goal < 130 10/20/2010  . DEPRESSION 10/20/2010  . Essential hypertension, benign 10/20/2010  . ALLERGIC RHINITIS 10/20/2010  . Mild persistent asthma 10/20/2010  . OSTEOARTHRITIS 10/20/2010    Past Medical History  Diagnosis Date  . Allergy   . Asthma   . Depression   . Hyperlipidemia   . Hypertension   . Hx of colonic polyps   . Osteoarthritis   . Anxiety     Past Surgical History  Procedure Laterality Date  . Tubal ligation      History  Substance Use Topics  . Smoking status: Never Smoker   . Smokeless tobacco: Never Used  . Alcohol Use: 1.8 oz/week    3 Glasses of wine per week     Comment: rarely    Family History  Problem Relation Age of Onset  . Arthritis Other   . Depression Other   . Hyperlipidemia Brother   .  Hypertension Brother     Allergies  Allergen Reactions  . Bupropion Hcl Other (See Comments)    palpitations  . Contrast Media [Iodinated Diagnostic Agents] Hives and Itching  . Tetracycline Nausea Only    Medication list has been reviewed and updated.  Current Outpatient Prescriptions on File Prior to Visit  Medication Sig Dispense Refill  . ALPRAZolam (XANAX) 0.25 MG tablet Take 0.25 mg by mouth at bedtime as needed for anxiety.      . Azelastine HCl (ASTEPRO) 0.15 % SOLN Place 1 Act into the nose 2 (two) times daily.  30 mL  11  . Diclofenac Sodium CR 100 MG 24 hr tablet Take 1 tablet (100 mg total) by mouth daily.  90 tablet  1  . estradiol-norethindrone (MIMVEY) 1-0.5 MG per tablet Take 1 tablet by mouth daily.      . hydrochlorothiazide (HYDRODIURIL) 25 MG tablet Take 25 mg by mouth daily.      [name] Kitchen PARoxetine (PAXIL) 20 MG tablet Take 1 tablet (20 mg total) by mouth every morning.  90 tablet  3  . potassium chloride SA (K-DUR,KLOR-CON) 20 MEQ tablet Take 10 mEq by mouth. As needed      . Senna-Psyllium (PERDIEM PO) Take 3 tablets by mouth once.      . traMADol Veatrice Bourbon)  50 MG tablet Take 50 mg by mouth every 6 (six) hours as needed for moderate pain.      . clindamycin (CLEOCIN) 150 MG capsule Take 2 capsules (300 mg total) by mouth 3 (three) times daily. May dispense as 150mg  capsules  42 capsule  0  . PARoxetine (PAXIL) 20 MG tablet Take 10 mg by mouth at bedtime.       No current facility-administered medications on file prior to visit.    Review of Systems:  As per HPI, otherwise negative.    Physical Examination: Filed Vitals:   02/24/14 1518  BP: 118/76  Pulse: 71  Temp: 98.5 F (36.9 C)  Resp: 18   Filed Vitals:   02/24/14 1518  Height: 5\' 5"  (1.651 m)  Weight: 216 lb (97.977 kg)   Body mass index is 35.94 kg/(m^2). Ideal Body Weight: Weight in (lb) to have BMI = 25: 149.9   GEN: WDWN, NAD, Non-toxic, Alert & Oriented x 3 HEENT: Atraumatic,  Normocephalic.  Ears and Nose: No external deformity. EXTR: No clubbing/cyanosis/edema NEURO: Normal gait.  PSYCH: Normally interactive. Conversant. Not depressed or anxious appearing.  Calm demeanor.  BACK:  Not tender.  Neuro and motor lowers intact  Assessment and Plan: DJD Anaprox Flexeril   Signed,  Ellison Carwin, MD

## 2014-04-17 ENCOUNTER — Other Ambulatory Visit: Payer: Self-pay | Admitting: Internal Medicine

## 2014-04-19 ENCOUNTER — Encounter: Payer: Self-pay | Admitting: Internal Medicine

## 2014-04-19 DIAGNOSIS — F329 Major depressive disorder, single episode, unspecified: Secondary | ICD-10-CM

## 2014-04-19 DIAGNOSIS — F3289 Other specified depressive episodes: Secondary | ICD-10-CM

## 2014-04-19 MED ORDER — PAROXETINE HCL 20 MG PO TABS: 20.0000 mg | ORAL_TABLET | ORAL | Status: DC

## 2014-04-19 NOTE — Telephone Encounter (Signed)
Patient called in regards to having enough of her PARoxetine (PAXIL) 20 MG tablet rx until her OV Monday, 04/23/14.

## 2014-04-23 ENCOUNTER — Other Ambulatory Visit (INDEPENDENT_AMBULATORY_CARE_PROVIDER_SITE_OTHER): Payer: BC Managed Care – PPO

## 2014-04-23 ENCOUNTER — Encounter: Payer: Self-pay | Admitting: Internal Medicine

## 2014-04-23 ENCOUNTER — Ambulatory Visit (INDEPENDENT_AMBULATORY_CARE_PROVIDER_SITE_OTHER): Payer: BC Managed Care – PPO | Admitting: Internal Medicine

## 2014-04-23 VITALS — BP 158/86 | HR 67 | Temp 97.1°F | Resp 16 | Ht 65.0 in | Wt 224.2 lb

## 2014-04-23 DIAGNOSIS — M5136 Other intervertebral disc degeneration, lumbar region: Secondary | ICD-10-CM

## 2014-04-23 DIAGNOSIS — D519 Vitamin B12 deficiency anemia, unspecified: Secondary | ICD-10-CM

## 2014-04-23 DIAGNOSIS — M545 Low back pain, unspecified: Secondary | ICD-10-CM

## 2014-04-23 DIAGNOSIS — D518 Other vitamin B12 deficiency anemias: Secondary | ICD-10-CM

## 2014-04-23 DIAGNOSIS — I10 Essential (primary) hypertension: Secondary | ICD-10-CM

## 2014-04-23 DIAGNOSIS — F329 Major depressive disorder, single episode, unspecified: Secondary | ICD-10-CM

## 2014-04-23 DIAGNOSIS — M5137 Other intervertebral disc degeneration, lumbosacral region: Secondary | ICD-10-CM

## 2014-04-23 DIAGNOSIS — M51379 Other intervertebral disc degeneration, lumbosacral region without mention of lumbar back pain or lower extremity pain: Secondary | ICD-10-CM

## 2014-04-23 DIAGNOSIS — F3289 Other specified depressive episodes: Secondary | ICD-10-CM

## 2014-04-23 LAB — BASIC METABOLIC PANEL
BUN: 15 mg/dL (ref 6–23)
CO2: 31 mEq/L (ref 19–32)
Calcium: 9.5 mg/dL (ref 8.4–10.5)
Chloride: 101 mEq/L (ref 96–112)
Creatinine, Ser: 0.7 mg/dL (ref 0.4–1.2)
GFR: 106 mL/min (ref >60.00)
Glucose, Bld: 78 mg/dL (ref 70–99)
POTASSIUM: 3.7 meq/L (ref 3.5–5.1)
SODIUM: 139 meq/L (ref 135–145)

## 2014-04-23 LAB — CBC WITH DIFFERENTIAL/PLATELET
Basophils Absolute: 0.1 10*3/uL (ref 0.0–0.1)
Basophils Relative: 1.8 % (ref 0.0–3.0)
EOS PCT: 1.8 % (ref 0.0–5.0)
Eosinophils Absolute: 0.1 10*3/uL (ref 0.0–0.7)
HCT: 35.9 % — ABNORMAL LOW (ref 36.0–46.0)
HEMOGLOBIN: 11.8 g/dL — AB (ref 12.0–15.0)
LYMPHS ABS: 1.9 10*3/uL (ref 0.7–4.0)
Lymphocytes Relative: 45.6 % (ref 12.0–46.0)
MCHC: 32.9 g/dL (ref 30.0–36.0)
MCV: 83 fl (ref 78.0–100.0)
Monocytes Absolute: 0.2 10*3/uL (ref 0.1–1.0)
Monocytes Relative: 4.4 % (ref 3.0–12.0)
Neutro Abs: 1.9 10*3/uL (ref 1.4–7.7)
Neutrophils Relative %: 46.4 % (ref 43.0–77.0)
PLATELETS: 229 10*3/uL (ref 150.0–400.0)
RBC: 4.33 Mil/uL (ref 3.87–5.11)
RDW: 13.7 % (ref 11.5–15.5)
WBC: 4.2 10*3/uL (ref 4.0–10.5)

## 2014-04-23 MED ORDER — TRAMADOL HCL 50 MG PO TABS: 50.0000 mg | ORAL_TABLET | Freq: Four times a day (QID) | ORAL | Status: DC | PRN

## 2014-04-23 MED ORDER — HYDROCHLOROTHIAZIDE 25 MG PO TABS: 25.0000 mg | ORAL_TABLET | Freq: Every day | ORAL | Status: DC

## 2014-04-23 MED ORDER — PAROXETINE HCL 20 MG PO TABS: 20.0000 mg | ORAL_TABLET | ORAL | Status: DC

## 2014-04-23 NOTE — Progress Notes (Signed)
Subjective:    Patient ID: Paula Meyer, female    DOB: [birth year], 63 y.o.   MRN: [id number]  Back Pain This is a recurrent problem. The current episode started 1 to 4 weeks ago. The problem occurs intermittently. The problem has been gradually improving since onset. The pain is present in the lumbar spine. The quality of the pain is described as aching. The pain does not radiate. The pain is at a severity of 2/10. The pain is mild. The symptoms are aggravated by bending and position. Pertinent negatives include no abdominal pain, bladder incontinence, bowel incontinence, chest pain, dysuria, fever, headaches, leg pain, numbness, paresis, paresthesias, pelvic pain, perianal numbness, tingling, weakness or weight loss. Risk factors include obesity and lack of exercise. She has tried analgesics and muscle relaxant for the symptoms. The treatment provided moderate relief.      Review of Systems  Constitutional: Negative.  Negative for fever, chills, weight loss, diaphoresis, appetite change and fatigue.  HENT: Negative.   Eyes: Negative.   Respiratory: Negative.  Negative for cough, choking, chest tightness, shortness of breath and stridor.   Cardiovascular: Negative.  Negative for chest pain, palpitations and leg swelling.  Gastrointestinal: Negative.  Negative for nausea, vomiting, abdominal pain, constipation, blood in stool and bowel incontinence.  Endocrine: Negative.   Genitourinary: Negative.  Negative for bladder incontinence, dysuria and pelvic pain.  Musculoskeletal: Positive for back pain. Negative for arthralgias, gait problem, joint swelling, myalgias, neck pain and neck stiffness.  Skin: Negative.   Allergic/Immunologic: Negative.   Neurological: Negative.  Negative for dizziness, tingling, weakness, numbness, headaches and paresthesias.  Hematological: Negative.  Negative for adenopathy. Does not bruise/bleed easily.  Psychiatric/Behavioral: Negative.        Objective:     Physical Exam  Vitals reviewed. Constitutional: She is oriented to person, place, and time. She appears well-developed and well-nourished. No distress.  HENT:  Head: Normocephalic and atraumatic.  Mouth/Throat: Oropharynx is clear and moist. No oropharyngeal exudate.  Eyes: Conjunctivae are normal. Right eye exhibits no discharge. Left eye exhibits no discharge. No scleral icterus.  Neck: Normal range of motion. Neck supple. No JVD present. No tracheal deviation present. No thyromegaly present.  Cardiovascular: Normal rate, regular rhythm, normal heart sounds and intact distal pulses.  Exam reveals no gallop and no friction rub.   No murmur heard. Pulmonary/Chest: Effort normal and breath sounds normal. No stridor. No respiratory distress. She has no wheezes. She has no rales. She exhibits no tenderness.  Abdominal: Soft. Bowel sounds are normal. She exhibits no distension and no mass. There is no tenderness. There is no rebound and no guarding.  Musculoskeletal: Normal range of motion. She exhibits no edema and no tenderness.       Lumbar back: Normal. She exhibits normal range of motion, no tenderness, no bony tenderness, no swelling, no edema, no deformity, no laceration, no pain, no spasm and normal pulse.  Lymphadenopathy:    She has no cervical adenopathy.  Neurological: She is alert and oriented to person, place, and time. She has normal strength. She displays no atrophy and no tremor. No cranial nerve deficit or sensory deficit. She exhibits normal muscle tone. She displays a negative Romberg sign. She displays no seizure activity. Coordination and gait normal.  Reflex Scores:      Tricep reflexes are 1+ on the right side.      Bicep reflexes are 1+ on the right side and 1+ on the left side.  Brachioradialis reflexes are 1+ on the right side and 1+ on the left side.      Patellar reflexes are 1+ on the right side and 1+ on the left side.      Achilles reflexes are 1+ on the right  side and 1+ on the left side. Neg SLR in BLE  Skin: Skin is warm and dry. No rash noted. She is not diaphoretic. No erythema. No pallor.  Psychiatric: She has a normal mood and affect. Her behavior is normal. Judgment and thought content normal.     Lab Results  Component Value Date   WBC 4.4* 07/10/2013   HGB 11.9* 07/10/2013   HCT 35.8* 07/10/2013   PLT 216.0 07/10/2013   GLUCOSE 89 07/10/2013   CHOL 251* 07/10/2013   TRIG 111.0 07/10/2013   HDL 43.10 07/10/2013   LDLDIRECT 188.6 07/10/2013   ALT 20 07/10/2013   AST 19 07/10/2013   NA 138 07/10/2013   K 3.5 07/10/2013   CL 102 07/10/2013   CREATININE 0.9 07/10/2013   BUN 16 07/10/2013   CO2 31 07/10/2013   TSH 3.49 07/10/2013   HGBA1C 6.0 07/10/2013       Assessment & Plan:

## 2014-04-23 NOTE — Progress Notes (Signed)
Pre visit review using our clinic review tool, if applicable. No additional management support is needed unless otherwise documented below in the visit note. 

## 2014-04-23 NOTE — Patient Instructions (Signed)

## 2014-04-24 ENCOUNTER — Telehealth: Payer: Self-pay | Admitting: Internal Medicine

## 2014-04-24 NOTE — Telephone Encounter (Signed)
Relevant patient education assigned to patient using Emmi. ° °

## 2014-04-24 NOTE — Assessment & Plan Note (Signed)
Will cont tramadol as needed for pain 

## 2014-04-24 NOTE — Assessment & Plan Note (Signed)
Improvement noted Will cont tramadol for pain

## 2014-04-24 NOTE — Assessment & Plan Note (Signed)
She has not taken the HCTZ in a few weeks and her BP is not well controlled She will restart the HCTZ Her lytes and renal function are stable

## 2014-08-06 ENCOUNTER — Other Ambulatory Visit: Payer: Self-pay | Admitting: Internal Medicine

## 2014-08-22 ENCOUNTER — Ambulatory Visit (INDEPENDENT_AMBULATORY_CARE_PROVIDER_SITE_OTHER): Payer: BC Managed Care – PPO | Admitting: Internal Medicine

## 2014-08-22 ENCOUNTER — Ambulatory Visit (INDEPENDENT_AMBULATORY_CARE_PROVIDER_SITE_OTHER)
Admission: RE | Admit: 2014-08-22 | Discharge: 2014-08-22 | Disposition: A | Discharge status: Home / Self Care | Payer: BC Managed Care – PPO | Source: Ambulatory Visit | Attending: Internal Medicine | Admitting: Internal Medicine

## 2014-08-22 ENCOUNTER — Encounter: Payer: Self-pay | Admitting: Internal Medicine

## 2014-08-22 VITALS — BP 140/76 | HR 68 | Temp 98.6°F | Resp 16 | Ht 65.0 in | Wt 223.1 lb

## 2014-08-22 DIAGNOSIS — M7630 Iliotibial band syndrome, unspecified leg: Secondary | ICD-10-CM | POA: Insufficient documentation

## 2014-08-22 DIAGNOSIS — M7632 Iliotibial band syndrome, left leg: Secondary | ICD-10-CM

## 2014-08-22 DIAGNOSIS — M629 Disorder of muscle, unspecified: Secondary | ICD-10-CM

## 2014-08-22 DIAGNOSIS — F418 Other specified anxiety disorders: Secondary | ICD-10-CM

## 2014-08-22 DIAGNOSIS — M25559 Pain in unspecified hip: Secondary | ICD-10-CM

## 2014-08-22 DIAGNOSIS — M25552 Pain in left hip: Secondary | ICD-10-CM

## 2014-08-22 DIAGNOSIS — I10 Essential (primary) hypertension: Secondary | ICD-10-CM

## 2014-08-22 DIAGNOSIS — M6740 Ganglion, unspecified site: Secondary | ICD-10-CM

## 2014-08-22 DIAGNOSIS — M67441 Ganglion, right hand: Secondary | ICD-10-CM

## 2014-08-22 DIAGNOSIS — F341 Dysthymic disorder: Secondary | ICD-10-CM

## 2014-08-22 MED ORDER — ALPRAZOLAM 0.25 MG PO TABS: 0.2500 mg | ORAL_TABLET | Freq: Every evening | ORAL | Status: DC | PRN

## 2014-08-22 MED ORDER — VILAZODONE HCL 10 & 20 & 40 MG PO KIT: 1.0000 | PACK | Freq: Every day | ORAL | Status: DC

## 2014-08-22 NOTE — Patient Instructions (Signed)
Iliotibial Band Syndrome  Iliotibial band syndrome is pain in the outer, lower thigh. The pain is caused by an inflammation of the iliotibial band. This is a band of thick fibrous tissue that runs down the outside of the thigh. The iliotibial band begins at the hip. It extends to the outer side of the shin bone (tibia) just below the knee joint. The band works with the thigh muscles. Together they provide stability to the outside of the knee joint.  Iliotibial band syndrome occurs when there is inflammation to this band of tissue. This is typically due to over use and not due to an injury. The irritation usually occurs over the outside of the knee joint, at the the end of the thigh bone (femur). The iliotibial band crosses bone and muscle at this point. Between these structures is a cushioning sac (bursa). The bursa should make possible a smooth gliding motion. However, when inflamed, the iliotibial band does not glide easily. When inflamed, there is pain with motion of the knee. Usually the pain worsens with continued movement and the pain goes away with rest.  This problem usually arises when there is a sudden increase in sports activities involving your legs. Running, and playing soccer or basketball are examples of activities causing this. Others who are prone to iliotibial band syndrome include individuals with mechanical problems such as leg length differences, abnormality of walking, bowed legs etc.  HOME CARE INSTRUCTIONS   · Apply ice to the injured area:  ¨ Put ice in a plastic bag.  ¨ Place a towel between your skin and the bag.  ¨ Leave the ice on for 20 minutes, 2-3 times a day.  · Limit excessive training or eliminate training until pain goes away.  · While pain is present, you may use gentle range of motion. Do not resume regular use until instructed by your health care provider. Begin use gradually. Do not increase activity to the point of pain. If pain does develop, decrease activity and continue  the above measures. Gradually increase activities that do not cause discomfort. Do this until you finally achieve normal use.  · Perform low-impact activities while pain is present. Wear proper footwear.  · Only take over-the-counter or prescription medicines for pain, discomfort, or fever as directed by your health care provider.  SEEK MEDICAL CARE IF:   · Your pain increases or pain is not controlled with medications.  · You develop new, unexplained symptoms, or an increase of the symptoms that brought you to your health care provider.  · Your pain and symptoms are not improving or are getting worse.  Document Released: 05/22/2002 Document Revised: 09/20/2013 Document Reviewed: 06/29/2013  ExitCare® Patient Information ©2015 [employer]. This information is not intended to replace advice given to you by your health care provider. Make sure you discuss any questions you have with your health care provider.

## 2014-08-22 NOTE — Progress Notes (Signed)
Pre visit review using our clinic review tool, if applicable. No additional management support is needed unless otherwise documented below in the visit note. 

## 2014-08-23 ENCOUNTER — Encounter: Payer: Self-pay | Admitting: Internal Medicine

## 2014-08-23 NOTE — Assessment & Plan Note (Signed)
She reports side effects from paxil and will not take it anymore I have asked her to start Viibryd, will cont xanax as needed

## 2014-08-23 NOTE — Assessment & Plan Note (Signed)
Cont meds for pain Start PT

## 2014-08-23 NOTE — Progress Notes (Signed)
Subjective:    Patient ID: Paula Meyer, female    DOB: 08/10/1951, 63 y.o.   MRN: [id number]  Hip Pain  The incident occurred more than 1 week ago. There was no injury mechanism. The pain is present in the left hip and left thigh. The quality of the pain is described as aching. The pain is at a severity of 3/10. The pain is mild. The pain has been worsening since onset. Pertinent negatives include no inability to bear weight, loss of motion, loss of sensation, muscle weakness, numbness or tingling. The symptoms are aggravated by movement. She has tried NSAIDs and acetaminophen for the symptoms. The treatment provided moderate relief.      Review of Systems  Constitutional: Negative.  Negative for fever, chills, diaphoresis, appetite change and fatigue.  HENT: Negative.   Eyes: Negative.   Respiratory: Negative.  Negative for apnea, cough, choking, chest tightness, shortness of breath, wheezing and stridor.   Cardiovascular: Negative.  Negative for chest pain, palpitations and leg swelling.  Gastrointestinal: Negative.  Negative for vomiting, abdominal pain, diarrhea, constipation and blood in stool.  Endocrine: Negative.   Genitourinary: Negative.   Musculoskeletal: Positive for arthralgias. Negative for back pain, gait problem, joint swelling, myalgias, neck pain and neck stiffness.       She has a painful cyst on her right middle finger. There is diffuse pain over the lateral aspect of the left thigh.  Skin: Negative.   Allergic/Immunologic: Negative.   Neurological: Negative.  Negative for tingling and numbness.  Hematological: Negative.  Negative for adenopathy. Does not bruise/bleed easily.  Psychiatric/Behavioral: Positive for dysphoric mood. Negative for suicidal ideas, hallucinations, behavioral problems, confusion, sleep disturbance, self-injury, decreased concentration and agitation. The patient is nervous/anxious. The patient is not hyperactive.        Objective:   Physical Exam  Vitals reviewed. Constitutional: She is oriented to person, place, and time. She appears well-developed and well-nourished. No distress.  HENT:  Head: Normocephalic and atraumatic.  Mouth/Throat: Oropharynx is clear and moist. No oropharyngeal exudate.  Eyes: Conjunctivae are normal. Right eye exhibits no discharge. Left eye exhibits no discharge. No scleral icterus.  Neck: Normal range of motion. Neck supple. No JVD present. No tracheal deviation present. No thyromegaly present.  Cardiovascular: Normal rate, regular rhythm, normal heart sounds and intact distal pulses.  Exam reveals no gallop and no friction rub.   No murmur heard. Pulmonary/Chest: Effort normal and breath sounds normal. No stridor. No respiratory distress. She has no wheezes. She has no rales. She exhibits no tenderness.  Abdominal: Soft. Bowel sounds are normal. She exhibits no distension and no mass. There is no tenderness. There is no rebound and no guarding.  Musculoskeletal: Normal range of motion. She exhibits no edema and no tenderness.       Left hip: Normal. She exhibits normal range of motion, normal strength, no tenderness, no bony tenderness, no swelling, no crepitus, no deformity and no laceration.       Right hand: She exhibits deformity. She exhibits normal range of motion, no tenderness, no bony tenderness, normal two-point discrimination, normal capillary refill, no laceration and no swelling. Normal sensation noted. Normal strength noted.       Hands: Lymphadenopathy:    She has no cervical adenopathy.  Neurological: She is oriented to person, place, and time.  Skin: Skin is warm and dry. No rash noted. She is not diaphoretic. No erythema. No pallor.  Psychiatric: She has a normal mood and affect.  Her behavior is normal. Judgment and thought content normal.          Assessment & Plan:

## 2014-08-23 NOTE — Assessment & Plan Note (Signed)
Her BP is well controlled 

## 2014-08-23 NOTE — Assessment & Plan Note (Signed)
The plain film shows moderate DJD Will cont meds and start PT

## 2014-08-23 NOTE — Assessment & Plan Note (Signed)
Refer to hand surgery for possible exicsion

## 2014-08-24 ENCOUNTER — Ambulatory Visit: Payer: BC Managed Care – PPO | Admitting: Internal Medicine

## 2014-08-24 NOTE — Addendum Note (Signed)
Addended by: [name] on: 08/24/2014 10:01 AM   Modules accepted: Orders

## 2014-09-21 ENCOUNTER — Encounter: Payer: Self-pay | Admitting: Internal Medicine

## 2014-09-24 ENCOUNTER — Encounter: Payer: Self-pay | Admitting: Internal Medicine

## 2014-09-24 ENCOUNTER — Other Ambulatory Visit: Payer: Self-pay | Admitting: Internal Medicine

## 2014-09-24 DIAGNOSIS — F418 Other specified anxiety disorders: Secondary | ICD-10-CM

## 2014-09-24 MED ORDER — DULOXETINE HCL 30 MG PO CPEP: 30.0000 mg | ORAL_CAPSULE | Freq: Every day | ORAL | Status: DC

## 2015-05-08 ENCOUNTER — Encounter: Payer: Self-pay | Admitting: Internal Medicine

## 2015-06-14 ENCOUNTER — Other Ambulatory Visit: Payer: Self-pay

## 2015-06-14 DIAGNOSIS — I10 Essential (primary) hypertension: Secondary | ICD-10-CM

## 2015-06-14 MED ORDER — HYDROCHLOROTHIAZIDE 25 MG PO TABS: 25.0000 mg | ORAL_TABLET | Freq: Every day | ORAL | Status: DC

## 2015-07-03 ENCOUNTER — Other Ambulatory Visit: Payer: Self-pay

## 2015-07-03 LAB — HM PAP SMEAR

## 2015-07-03 LAB — HM MAMMOGRAPHY

## 2015-07-04 LAB — CYTOLOGY - PAP

## 2015-09-30 ENCOUNTER — Other Ambulatory Visit: Payer: Self-pay

## 2015-09-30 DIAGNOSIS — F418 Other specified anxiety disorders: Secondary | ICD-10-CM

## 2015-09-30 MED ORDER — DULOXETINE HCL 30 MG PO CPEP: 30.0000 mg | ORAL_CAPSULE | Freq: Every day | ORAL | Status: DC

## 2015-10-24 ENCOUNTER — Encounter: Payer: Self-pay | Admitting: Family

## 2015-10-24 ENCOUNTER — Ambulatory Visit (INDEPENDENT_AMBULATORY_CARE_PROVIDER_SITE_OTHER): Payer: BC Managed Care – PPO | Admitting: Family

## 2015-10-24 VITALS — BP 122/90 | HR 76 | Temp 98.2°F | Resp 18 | Ht 65.0 in | Wt 201.8 lb

## 2015-10-24 DIAGNOSIS — R21 Rash and other nonspecific skin eruption: Secondary | ICD-10-CM | POA: Diagnosis not present

## 2015-10-24 DIAGNOSIS — F418 Other specified anxiety disorders: Secondary | ICD-10-CM

## 2015-10-24 MED ORDER — POTASSIUM CHLORIDE CRYS ER 20 MEQ PO TBCR: 10.0000 meq | EXTENDED_RELEASE_TABLET | Freq: Every day | ORAL | Status: DC

## 2015-10-24 MED ORDER — TRIAMCINOLONE ACETONIDE 0.1 % EX CREA: 1.0000 "application " | TOPICAL_CREAM | Freq: Two times a day (BID) | CUTANEOUS | Status: DC

## 2015-10-24 MED ORDER — AZELASTINE HCL 0.15 % NA SOLN: NASAL | Status: DC

## 2015-10-24 MED ORDER — DULOXETINE HCL 40 MG PO CPEP: 40.0000 mg | ORAL_CAPSULE | Freq: Every day | ORAL | Status: DC

## 2015-10-24 NOTE — Progress Notes (Signed)
Pre visit review using our clinic review tool, if applicable. No additional management support is needed unless otherwise documented below in the visit note. 

## 2015-10-24 NOTE — Assessment & Plan Note (Signed)
Rash appears eczematous in nature and unlikely to be related duloxetine. Start triamcinolone cream. Follow up if symptoms worsen or do not improve.

## 2015-10-24 NOTE — Assessment & Plan Note (Signed)
Anxiety is well controlled with alprazolam. Takes the medication as prescribed and denies adverse side effects. Depression is mildly worsen with no evidence of suicidal ideation. Discussed increasing duloxetine to 40 mg. Continue current dosage of alprazolam. Follow up in one month to determine effectiveness.

## 2015-10-24 NOTE — Patient Instructions (Addendum)
Thank you for choosing [facility].  Summary/Instructions:  Your prescription(s) have been submitted to your pharmacy or been printed and provided for you. Please take as directed and contact our office if you believe you are having problem(s) with the medication(s) or have any questions.  If your symptoms worsen or fail to improve, please contact our office for further instruction, or in case of emergency go directly to the emergency room at the closest medical facility.    Desvenlafaxine extended-release tablets What is this medicine? DESVENLAFAXINE (des VEN la [employer]) is used to treat depression. This medicine may be used for other purposes; ask your health care provider or pharmacist if you have questions. What should I tell my health care provider before I take this medicine? They need to know if you have any of these conditions: -glaucoma -high blood pressure -kidney disease -liver disease -mania or bipolar disorder -suicidal thoughts or a previous suicide attempt -an unusual reaction to desvenlafaxine, venlafaxine, other medicines, foods, dyes, or preservatives -pregnant or trying to get pregnant -breast-feeding How should I use this medicine? Take this medicine by mouth with a drink of water. Do not crush, cut or chew. Follow the directions on the prescription label. Take your doses at regular intervals. Do not take your medicine more often than directed. Do not stop taking this medicine suddenly except upon the advice of your doctor. Stopping this medicine too quickly may cause serious side effects or your condition may worsen. Contact your pediatrician or health care professional regarding the use of this medicine in children. Special care may be needed. A special MedGuide will be given to you by the pharmacist with each prescription and refill. Be sure to read this information carefully each time. Overdosage: If you think you have taken too much of this medicine contact  a poison control center or emergency room at once. NOTE: This medicine is only for you. Do not share this medicine with others. What if I miss a dose? If you miss a dose, take it as soon as you can. If it is almost time for your next dose, take only that dose. Do not take double or extra doses. What may interact with this medicine? Do not take this medicine with any of the following medications: -duloxetine -linezolid -MAOIs like Azilect, Carbex, Eldepryl, Marplan, Nardil, and Parnate -methylene blue (injected into a vein) -venlafaxine This medicine may also interact with the following medications: -alcohol -amphetamine -aspirin and aspirin-like medicines -certain migraine headache medicines (almotriptan, eletriptan, frovatriptan, naratriptan, rizatriptan, sumatriptan, zolmitriptan) -dexfenfluramine or fenfluramine -dextroamphetamine -furazolidone -isoniazid -lithium -medicines for heart rhythm or blood pressure -medicines that treat or prevent blood clots like warfarin, enoxaparin, and dalteparin -methylphenidate -metoclopramide -NSAIDS, medicines for pain and inflammation, like ibuprofen or naproxen -pentazocine -phentermine -procarbazine -protriptyline -sibutramine -St. John's wort, Hypericum perforatum -tramadol -tryptophan -zolpidem This list may not describe all possible interactions. Give your health care provider a list of all the medicines, herbs, non-prescription drugs, or dietary supplements you use. Also tell them if you smoke, drink alcohol, or use illegal drugs. Some items may interact with your medicine. What should I watch for while using this medicine? Tell your doctor if your symptoms do not get better or if they get worse. Visit your doctor or health care professional for regular checks on your progress. Because it may take several weeks to see the full effects of this medicine, it is important to continue your treatment as prescribed by your doctor. Patients  and their families should  watch out for new or worsening thoughts of suicide or depression. Also watch out for sudden changes in feelings such as feeling anxious, agitated, panicky, irritable, hostile, aggressive, impulsive, severely restless, overly excited and hyperactive, or not being able to sleep. If this happens, especially at the beginning of treatment or after a change in dose, call your health care professional. This medicine can cause an increase in blood pressure. Check with your doctor for instructions on monitoring your blood pressure while taking this medicine. You may get drowsy or dizzy. Do not drive, use machinery, or do anything that needs mental alertness until you know how this medicine affects you. Do not stand or sit up quickly, especially if you are an older patient. This reduces the risk of dizzy or fainting spells. Alcohol may interfere with the effect of this medicine. Avoid alcoholic drinks. Your mouth may get dry. Chewing sugarless gum, sucking hard candy and drinking plenty of water will help. Contact your doctor if the problem does not go away or is severe. What side effects may I notice from receiving this medicine? Side effects that you should report to your doctor or health care professional as soon as possible: -allergic reactions like skin rash, itching or hives, swelling of the face, lips, or tongue -hallucination, loss of contact with reality -mania (over-active behavior) -increase in blood pressure -redness, blistering, peeling or loosening of the skin, including inside the mouth -seizures -sexual difficulties (abnormal ejaculation or orgasm) -unusual bleeding or bruising -vomiting Side effects that usually do not require medical attention (report to your doctor or health care professional if they continue or are bothersome): -constipation -difficulty sleeping -dizziness, drowsiness -dry mouth -increased sweating -loss of appetite -nausea -tremor This  list may not describe all possible side effects. Call your doctor for medical advice about side effects. You may report side effects to FDA at 1-800-FDA-1088. Where should I keep my medicine? Keep out of the reach of children. Store at room temperature between 15 and 30 degrees C (59 and 86 degrees F). Throw away any unused medicine after the expiration date. NOTE: This sheet is a summary. It may not cover all possible information. If you have questions about this medicine, talk to your doctor, pharmacist, or health care provider.    2016, Elsevier/Gold Standard. (2014-04-02 10:39:35)  Venlafaxine extended-release capsules What is this medicine? VENLAFAXINE(VEN la fax een) is used to treat depression, anxiety and panic disorder. This medicine may be used for other purposes; ask your health care provider or pharmacist if you have questions. What should I tell my health care provider before I take this medicine? They need to know if you have any of these conditions: -bleeding disorders -glaucoma -heart disease -high blood pressure -high cholesterol -kidney disease -liver disease -low levels of sodium in the blood -mania or bipolar disorder -seizures -suicidal thoughts, plans, or attempt; a previous suicide attempt by you or a family -take medicines that treat or prevent blood clots -thyroid disease -an unusual or allergic reaction to venlafaxine, desvenlafaxine, other medicines, foods, dyes, or preservatives -pregnant or trying to get pregnant -breast-feeding How should I use this medicine? Take this medicine by mouth with a full glass of water. Follow the directions on the prescription label. Do not cut, crush, or chew this medicine. Take it with food. If needed, the capsule may be carefully opened and the entire contents sprinkled on a spoonful of cool applesauce. Swallow the applesauce/pellet mixture right away without chewing and follow with a glass of water  to ensure complete  swallowing of the pellets. Try to take your medicine at about the same time each day. Do not take your medicine more often than directed. Do not stop taking this medicine suddenly except upon the advice of your doctor. Stopping this medicine too quickly may cause serious side effects or your condition may worsen. A special MedGuide will be given to you by the pharmacist with each prescription and refill. Be sure to read this information carefully each time. Talk to your pediatrician regarding the use of this medicine in children. Special care may be needed. Overdosage: If you think you have taken too much of this medicine contact a poison control center or emergency room at once. NOTE: This medicine is only for you. Do not share this medicine with others. What if I miss a dose? If you miss a dose, take it as soon as you can. If it is almost time for your next dose, take only that dose. Do not take double or extra doses. What may interact with this medicine? Do not take this medicine with any of the following medications: -certain medicines for fungal infections like fluconazole, itraconazole, ketoconazole, posaconazole, voriconazole -cisapride -desvenlafaxine -dofetilide -dronedarone -duloxetine -levomilnacipran -linezolid -MAOIs like Carbex, Eldepryl, Marplan, Nardil, and Parnate -methylene blue (injected into a vein) -milnacipran -pimozide -thioridazine -ziprasidone This medicine may also interact with the following medications: -aspirin and aspirin-like medicines -certain medicines for depression, anxiety, or psychotic disturbances -certain medicines for migraine headaches like almotriptan, eletriptan, frovatriptan, naratriptan, rizatriptan, sumatriptan, zolmitriptan -certain medicines for sleep -certain medicines that treat or prevent blood clots like dalteparin, enoxaparin,  warfarin -cimetidine -clozapine -diuretics -fentanyl -furazolidone -indinavir -isoniazid -lithium -metoprolol -NSAIDS, medicines for pain and inflammation, like ibuprofen or naproxen -other medicines that prolong the QT interval (cause an abnormal heart rhythm) -procarbazine -rasagiline -supplements like St. John's wort, kava kava, valerian -tramadol -tryptophan This list may not describe all possible interactions. Give your health care provider a list of all the medicines, herbs, non-prescription drugs, or dietary supplements you use. Also tell them if you smoke, drink alcohol, or use illegal drugs. Some items may interact with your medicine. What should I watch for while using this medicine? Tell your doctor if your symptoms do not get better or if they get worse. Visit your doctor or health care professional for regular checks on your progress. Because it may take several weeks to see the full effects of this medicine, it is important to continue your treatment as prescribed by your doctor. Patients and their families should watch out for new or worsening thoughts of suicide or depression. Also watch out for sudden changes in feelings such as feeling anxious, agitated, panicky, irritable, hostile, aggressive, impulsive, severely restless, overly excited and hyperactive, or not being able to sleep. If this happens, especially at the beginning of treatment or after a change in dose, call your health care professional. This medicine can cause an increase in blood pressure. Check with your doctor for instructions on monitoring your blood pressure while taking this medicine. You may get drowsy or dizzy. Do not drive, use machinery, or do anything that needs mental alertness until you know how this medicine affects you. Do not stand or sit up quickly, especially if you are an older patient. This reduces the risk of dizzy or fainting spells. Alcohol may interfere with the effect of this medicine.  Avoid alcoholic drinks. Your mouth may get dry. Chewing sugarless gum, sucking hard candy and drinking plenty of water will help.  Contact your doctor if the problem does not go away or is severe. What side effects may I notice from receiving this medicine? Side effects that you should report to your doctor or health care professional as soon as possible: -allergic reactions like skin rash, itching or hives, swelling of the face, lips, or tongue -breathing problems -changes in vision -hallucination, loss of contact with reality -seizures -suicidal thoughts or other mood changes -trouble passing urine or change in the amount of urine -unusual bleeding or bruising Side effects that usually do not require medical attention (report to your doctor or health care professional if they continue or are bothersome): -change in sex drive or performance -constipation -increased sweating -loss of appetite -nausea -tremors -weight loss This list may not describe all possible side effects. Call your doctor for medical advice about side effects. You may report side effects to FDA at 1-800-FDA-1088. Where should I keep my medicine? Keep out of the reach of children. Store at a controlled temperature between 20 and 25 degrees C (68 degrees and 77 degrees F), in a dry place. Throw away any unused medicine after the expiration date. NOTE: This sheet is a summary. It may not cover all possible information. If you have questions about this medicine, talk to your doctor, pharmacist, or health care provider.    2016, Elsevier/Gold Standard. (2013-06-27 12:46:03)

## 2015-10-24 NOTE — Progress Notes (Signed)
Subjective:    Patient ID: Paula Meyer, female    DOB: 21-Jan-1951, 64 y.o.   MRN: [id number]  Chief Complaint  Patient presents with  . Medication Refill    rash on chest that she thinks is caused by cymbalta but states that she needs it bc it helps with her depression    HPI:  Paula Meyer is a 64 y.o. female who  has a past medical history of Allergy; Asthma; Depression; Hyperlipidemia; Hypertension; colonic polyps; Osteoarthritis; and Anxiety. and presents today for an office follow up.   1.) Depression/Rash  - Currently maintained on Xanax as needed and Cymbalta for her depression. Takes the medication as prescribed and questions a rash on her chest and if it is related to the Cymbalta. Rash is described as itchy and has been going on for about 2-3 months. Modifying factors include changing soaps which did not help. Reports the Cymbalta does help with her depression symptoms and denies sucidial ideation.    Allergies  Allergen Reactions  . Paxil [Paroxetine Hcl]     Makes her grind her teeth  . Bupropion Hcl Other (See Comments)    palpitations  . Contrast Media [Iodinated Diagnostic Agents] Hives and Itching  . Tetracycline Nausea Only    Current Outpatient Prescriptions on File Prior to Visit  Medication Sig Dispense Refill  . ALPRAZolam (XANAX) 0.25 MG tablet Take 1 tablet (0.25 mg total) by mouth at bedtime as needed for anxiety. 30 tablet 5  . cholecalciferol (VITAMIN D) 1000 UNITS tablet Take 1,000 Units by mouth daily.    [name] Kitchen estradiol-norethindrone (MIMVEY) 1-0.5 MG per tablet Take 1 tablet by mouth daily.    . hydrochlorothiazide (HYDRODIURIL) 25 MG tablet Take 1 tablet (25 mg total) by mouth daily. 90 tablet 2  . Senna-Psyllium (PERDIEM PO) Take 3 tablets by mouth once.    . traMADol (ULTRAM) 50 MG tablet Take 1 tablet (50 mg total) by mouth every 6 (six) hours as needed for moderate pain. 75 tablet 3   No current facility-administered medications on file  prior to visit.    Review of Systems  Constitutional: Negative for fever and chills.  Skin: Positive for rash.  Psychiatric/Behavioral: Negative for sleep disturbance and dysphoric mood.      Objective:    BP 122/90 mmHg  Pulse 76  Temp(Src) 98.2 F (36.8 C) (Oral)  Resp 18  Ht 5\' 5"  (1.651 m)  Wt 201 lb 12.8 oz (91.536 kg)  BMI 33.58 kg/m2  SpO2 98%  LMP 04/02/2011 Nursing note and vital signs reviewed.  Physical Exam  Constitutional: She is oriented to person, place, and time. She appears well-developed and well-nourished. No distress.  Cardiovascular: Normal rate, regular rhythm, normal heart sounds and intact distal pulses.   Pulmonary/Chest: Effort normal and breath sounds normal.  Neurological: She is alert and oriented to person, place, and time.  Skin: Skin is warm and dry. Rash (Slightly red patches with some papules located on her chest and lateral part of her left neck) noted.  Psychiatric: She has a normal mood and affect. Her behavior is normal. Judgment and thought content normal.       Assessment & Plan:   Problem List Items Addressed This Visit      Musculoskeletal and Integument   Rash and nonspecific skin eruption    Rash appears eczematous in nature and unlikely to be related duloxetine. Start triamcinolone cream. Follow up if symptoms worsen or do not improve.  Relevant Medications   triamcinolone cream (KENALOG) 0.1 %     Other   Depression with anxiety - Primary    Anxiety is well controlled with alprazolam. Takes the medication as prescribed and denies adverse side effects. Depression is mildly worsen with no evidence of suicidal ideation. Discussed increasing duloxetine to 40 mg. Continue current dosage of alprazolam. Follow up in one month to determine effectiveness.       Relevant Medications   DULoxetine HCl 40 MG CPEP

## 2016-02-13 ENCOUNTER — Encounter: Payer: Self-pay | Admitting: Internal Medicine

## 2016-02-13 ENCOUNTER — Ambulatory Visit (INDEPENDENT_AMBULATORY_CARE_PROVIDER_SITE_OTHER): Payer: BC Managed Care – PPO | Admitting: Internal Medicine

## 2016-02-13 ENCOUNTER — Other Ambulatory Visit (INDEPENDENT_AMBULATORY_CARE_PROVIDER_SITE_OTHER): Payer: BC Managed Care – PPO

## 2016-02-13 VITALS — BP 120/80 | HR 68 | Temp 97.9°F | Resp 16 | Wt 206.0 lb

## 2016-02-13 DIAGNOSIS — E785 Hyperlipidemia, unspecified: Secondary | ICD-10-CM | POA: Diagnosis not present

## 2016-02-13 DIAGNOSIS — R7309 Other abnormal glucose: Secondary | ICD-10-CM

## 2016-02-13 DIAGNOSIS — M791 Myalgia: Secondary | ICD-10-CM

## 2016-02-13 DIAGNOSIS — IMO0001 Reserved for inherently not codable concepts without codable children: Secondary | ICD-10-CM

## 2016-02-13 DIAGNOSIS — D519 Vitamin B12 deficiency anemia, unspecified: Secondary | ICD-10-CM

## 2016-02-13 DIAGNOSIS — I10 Essential (primary) hypertension: Secondary | ICD-10-CM

## 2016-02-13 DIAGNOSIS — M609 Myositis, unspecified: Secondary | ICD-10-CM

## 2016-02-13 LAB — CBC WITH DIFFERENTIAL/PLATELET
BASOS PCT: 0.7 % (ref 0.0–3.0)
Basophils Absolute: 0 10*3/uL (ref 0.0–0.1)
EOS ABS: 0.1 10*3/uL (ref 0.0–0.7)
EOS PCT: 2.7 % (ref 0.0–5.0)
HEMATOCRIT: 35.6 % — AB (ref 36.0–46.0)
Hemoglobin: 12 g/dL (ref 12.0–15.0)
LYMPHS PCT: 43.3 % (ref 12.0–46.0)
Lymphs Abs: 1.5 10*3/uL (ref 0.7–4.0)
MCHC: 33.6 g/dL (ref 30.0–36.0)
MCV: 80.8 fl (ref 78.0–100.0)
Monocytes Absolute: 0.3 10*3/uL (ref 0.1–1.0)
Monocytes Relative: 7.6 % (ref 3.0–12.0)
NEUTROS ABS: 1.6 10*3/uL (ref 1.4–7.7)
Neutrophils Relative %: 45.7 % (ref 43.0–77.0)
PLATELETS: 229 10*3/uL (ref 150.0–400.0)
RBC: 4.41 Mil/uL (ref 3.87–5.11)
RDW: 13.7 % (ref 11.5–15.5)
WBC: 3.5 10*3/uL — ABNORMAL LOW (ref 4.0–10.5)

## 2016-02-13 LAB — FERRITIN: FERRITIN: 114.9 ng/mL (ref 10.0–291.0)

## 2016-02-13 LAB — LIPID PANEL
CHOL/HDL RATIO: 6
Cholesterol: 297 mg/dL — ABNORMAL HIGH (ref 0–200)
HDL: 48.6 mg/dL (ref >39.00)
LDL Cholesterol: 227 mg/dL — ABNORMAL HIGH (ref 0–99)
NonHDL: 248.57
TRIGLYCERIDES: 106 mg/dL (ref 0.0–149.0)
VLDL: 21.2 mg/dL (ref 0.0–40.0)

## 2016-02-13 LAB — FOLATE: Folate: 12 ng/mL (ref >5.9)

## 2016-02-13 LAB — HEMOGLOBIN A1C: Hgb A1c MFr Bld: 5.7 % (ref 4.6–6.5)

## 2016-02-13 LAB — COMPREHENSIVE METABOLIC PANEL
ALT: 15 U/L (ref 0–35)
AST: 16 U/L (ref 0–37)
Albumin: 4.2 g/dL (ref 3.5–5.2)
Alkaline Phosphatase: 60 U/L (ref 39–117)
BUN: 13 mg/dL (ref 6–23)
CHLORIDE: 103 meq/L (ref 96–112)
CO2: 31 meq/L (ref 19–32)
Calcium: 9.7 mg/dL (ref 8.4–10.5)
Creatinine, Ser: 0.86 mg/dL (ref 0.40–1.20)
GFR: 85.83 mL/min (ref >60.00)
Glucose, Bld: 98 mg/dL (ref 70–99)
POTASSIUM: 3.7 meq/L (ref 3.5–5.1)
Sodium: 140 mEq/L (ref 135–145)
Total Bilirubin: 0.5 mg/dL (ref 0.2–1.2)
Total Protein: 7.5 g/dL (ref 6.0–8.3)

## 2016-02-13 LAB — MAGNESIUM: Magnesium: 2.1 mg/dL (ref 1.5–2.5)

## 2016-02-13 LAB — CK: CK TOTAL: 105 U/L (ref 7–177)

## 2016-02-13 LAB — VITAMIN B12: Vitamin B-12: 352 pg/mL (ref 211–911)

## 2016-02-13 LAB — TSH: TSH: 4.11 u[IU]/mL (ref 0.35–4.50)

## 2016-02-13 LAB — SEDIMENTATION RATE: SED RATE: 31 mm/h — AB (ref 0–22)

## 2016-02-13 MED ORDER — ATORVASTATIN CALCIUM 80 MG PO TABS: 80.0000 mg | ORAL_TABLET | Freq: Every day | ORAL | Status: DC

## 2016-02-13 NOTE — Patient Instructions (Signed)
Myofascial Pain Syndrome and Fibromyalgia  Myofascial pain syndrome and fibromyalgia are both pain disorders. This pain may be felt mainly in your muscles.   · Myofascial pain syndrome:    Always has trigger points or tender points in the muscle that will cause pain when pressed. The pain may come and go.    Usually affects your neck, upper back, and shoulder areas. The pain often radiates into your arms and hands.  · Fibromyalgia:    Has muscle pains and tenderness that come and go.    Is often associated with fatigue and sleep disturbances.    Has trigger points.    Tends to be long-lasting (chronic), but is not life-threatening.  Fibromyalgia and myofascial pain are not the same. However, they often occur together. If you have both conditions, each can make the other worse. Both are common and can cause enough pain and fatigue to make day-to-day activities difficult.   CAUSES   The exact causes of fibromyalgia and myofascial pain are not known. People with certain gene types may be more likely to develop fibromyalgia. Some factors can be triggers for both conditions, such as:   · Spine disorders.  · Arthritis.  · Severe injury (trauma) and other physical stressors.  · Being under a lot of stress.  · A medical illness.  SIGNS AND SYMPTOMS   Fibromyalgia  The main symptom of fibromyalgia is widespread pain and tenderness in your muscles. This can vary over time. Pain is sometimes described as stabbing, shooting, or burning. You may have tingling or numbness, too. You may also have sleep problems and fatigue. You may wake up feeling tired and groggy (fibro fog). Other symptoms may include:   · Bowel and bladder problems.  · Headaches.  · Visual problems.  · Problems with odors and noises.  · Depression or mood changes.  · Painful menstrual periods (dysmenorrhea).  · Dry skin or eyes.  Myofascial pain syndrome  Symptoms of myofascial pain syndrome include:   · Tight, ropy bands of muscle.    · Uncomfortable  sensations in muscular areas, such as:    Aching.    Cramping.    Burning.    Numbness.    Tingling.      Muscle weakness.  · Trouble moving certain muscles freely (range of motion).  DIAGNOSIS   There are no specific tests to diagnose fibromyalgia or myofascial pain syndrome. Both can be hard to diagnose because their symptoms are common in many other conditions. Your health care provider may suspect one or both of these conditions based on your symptoms and medical history. Your health care provider will also do a physical exam.   The key to diagnosing fibromyalgia is having pain, fatigue, and other symptoms for more than three months that cannot be explained by another condition.   The key to diagnosing myofascial pain syndrome is finding trigger points in muscles that are tender and cause pain elsewhere in your body (referred pain).  TREATMENT   Treating fibromyalgia and myofascial pain often requires a team of health care providers. This usually starts with your primary provider and a physical therapist. You may also find it helpful to work with alternative health care providers, such as massage therapists or acupuncturists.  Treatment for fibromyalgia may include medicines. This may include nonsteroidal anti-inflammatory drugs (NSAIDs), along with other medicines.   Treatment for myofascial pain may also include:  · NSAIDs.  · Cooling and stretching of muscles.  · Trigger point injections.  ·   Sound wave (ultrasound) treatments to stimulate muscles.  HOME CARE INSTRUCTIONS   · Take medicines only as directed by your health care provider.  · Exercise as directed by your health care provider or physical therapist.  · Try to avoid stressful situations.  · Practice relaxation techniques to control your stress. You may want to try:    Biofeedback.    Visual imagery.    Hypnosis.    Muscle relaxation.    Yoga.    Meditation.  · Talk to your health care provider about alternative treatments, such as acupuncture or  massage treatment.  · Maintain a healthy lifestyle. This includes eating a healthy diet and getting enough sleep.  · Consider joining a support group.  · Do not do activities that stress or strain your muscles. That includes repetitive motions and heavy lifting.  SEEK MEDICAL CARE IF:   · You have new symptoms.  · Your symptoms get worse.  · You have side effects from your medicines.  · You have trouble sleeping.  · Your condition is causing depression or anxiety.  FOR MORE INFORMATION   · National Fibromyalgia Association: http://www.fmaware.orgwww.fmaware.org  · Arthritis Foundation: http://www.arthritis.orgwww.arthritis.org  · American Chronic Pain Association: http://www.theacpa.org/condition/myofascial-painwww.theacpa.org/condition/myofascial-pain     This information is not intended to replace advice given to you by your health care provider. Make sure you discuss any questions you have with your health care provider.     Document Released: 11/30/2005 Document Revised: 12/21/2014 Document Reviewed: 09/05/2014  Elsevier Interactive Patient Education ©2016 [employer] Inc[employer]

## 2016-02-13 NOTE — Progress Notes (Signed)
Subjective:  Patient ID: Paula Meyer, female    DOB: 1951/02/01  Age: 65 y.o. MRN: [id number]  CC: Anemia and Hypertension   HPI ROSHENA BEILSTEIN presents for a follow-up on anemia and hypertension. Her main complaint today is pain in both lower extremities from the knees down. She describes it as a joint stiffness in her knees and sharp, stabbing pain in her calves. The symptoms occur at rest. She denies anything suspicious for claudication. She can relieve the pain with the application of heat and taking an anti-inflammatory like Aleve. She has had these symptoms off and on for about 2 months. She denies any trauma or injury. She says her joints never get swollen. She denies numbness, weakness, tingling in her lower extremities.  Outpatient Prescriptions Prior to Visit  Medication Sig Dispense Refill  . ALPRAZolam (XANAX) 0.25 MG tablet Take 1 tablet (0.25 mg total) by mouth at bedtime as needed for anxiety. 30 tablet 5  . Azelastine HCl (ASTEPRO) 0.15 % SOLN USE 1 SPRAY IN THE NOSE TWICE DAILY 30 mL 11  . cholecalciferol (VITAMIN D) 1000 UNITS tablet Take 1,000 Units by mouth daily.    . DULoxetine HCl 40 MG CPEP Take 40 mg by mouth daily. 30 capsule 1  . hydrochlorothiazide (HYDRODIURIL) 25 MG tablet Take 1 tablet (25 mg total) by mouth daily. 90 tablet 2  . potassium chloride SA (K-DUR,KLOR-CON) 20 MEQ tablet Take 0.5 tablets (10 mEq total) by mouth daily. As needed 90 tablet 3  . Senna-Psyllium (PERDIEM PO) Take 3 tablets by mouth once.    . triamcinolone cream (KENALOG) 0.1 % Apply 1 application topically 2 (two) times daily. 30 g 0  . estradiol-norethindrone (MIMVEY) 1-0.5 MG per tablet Take 1 tablet by mouth daily.    . traMADol (ULTRAM) 50 MG tablet Take 1 tablet (50 mg total) by mouth every 6 (six) hours as needed for moderate pain. 75 tablet 3   No facility-administered medications prior to visit.    ROS Review of Systems  Constitutional: Negative.  Negative for fever,  chills, diaphoresis, appetite change and fatigue.  HENT: Negative.  Negative for trouble swallowing.   Eyes: Negative.   Respiratory: Negative.  Negative for cough, choking, chest tightness, shortness of breath and stridor.   Cardiovascular: Negative.  Negative for chest pain, palpitations and leg swelling.  Gastrointestinal: Negative.  Negative for nausea, vomiting, abdominal pain, diarrhea, constipation and blood in stool.  Endocrine: Negative.   Genitourinary: Negative.   Musculoskeletal: Positive for myalgias and arthralgias. Negative for back pain, joint swelling and neck pain.  Skin: Negative.  Negative for color change and rash.  Allergic/Immunologic: Negative.   Neurological: Negative.  Negative for dizziness, syncope, speech difficulty, weakness, light-headedness, numbness and headaches.  Hematological: Negative.  Negative for adenopathy. Does not bruise/bleed easily.  Psychiatric/Behavioral: Negative.  Negative for suicidal ideas and dysphoric mood. The patient is not nervous/anxious.     Objective:  BP 120/80 mmHg  Pulse 68  Temp(Src) 97.9 F (36.6 C) (Oral)  Resp 16  Wt 206 lb (93.441 kg)  SpO2 96%  LMP 04/02/2011  BP Readings from Last 3 Encounters:  02/13/16 120/80  10/24/15 122/90  08/22/14 140/76    Wt Readings from Last 3 Encounters:  02/13/16 206 lb (93.441 kg)  10/24/15 201 lb 12.8 oz (91.536 kg)  08/22/14 223 lb 1.9 oz (101.207 kg)    Physical Exam  Constitutional: She is oriented to person, place, and time. She appears well-developed and well-nourished.  No distress.  HENT:  Mouth/Throat: Oropharynx is clear and moist. No oropharyngeal exudate.  Eyes: Conjunctivae are normal. Right eye exhibits no discharge. Left eye exhibits no discharge. No scleral icterus.  Neck: Normal range of motion. Neck supple. No JVD present. No tracheal deviation present. No thyromegaly present.  Cardiovascular: Normal rate, regular rhythm, normal heart sounds and intact  distal pulses.  Exam reveals no gallop and no friction rub.   No murmur heard. Pulses:      Carotid pulses are 1+ on the right side, and 1+ on the left side.      Radial pulses are 1+ on the right side, and 1+ on the left side.       Femoral pulses are 1+ on the right side, and 1+ on the left side.      Popliteal pulses are 1+ on the right side, and 1+ on the left side.       Dorsalis pedis pulses are 1+ on the right side, and 1+ on the left side.       Posterior tibial pulses are 1+ on the right side, and 1+ on the left side.  Pulmonary/Chest: Effort normal and breath sounds normal. No stridor. No respiratory distress. She has no wheezes. She has no rales. She exhibits no tenderness.  Abdominal: Soft. Bowel sounds are normal. She exhibits no distension and no mass. There is no tenderness. There is no rebound and no guarding.  Musculoskeletal: Normal range of motion. She exhibits no edema or tenderness.  Lymphadenopathy:    She has no cervical adenopathy.  Neurological: She is oriented to person, place, and time. She displays no atrophy, no tremor and normal reflexes. No cranial nerve deficit or sensory deficit. She exhibits normal muscle tone. She displays a negative Romberg sign. She displays no seizure activity. Coordination and gait normal.  Skin: Skin is warm and dry. No rash noted. She is not diaphoretic. No erythema. No pallor.  Psychiatric: She has a normal mood and affect. Her behavior is normal. Judgment and thought content normal.  Vitals reviewed.   Lab Results  Component Value Date   WBC 3.5* 02/13/2016   HGB 12.0 02/13/2016   HCT 35.6* 02/13/2016   PLT 229.0 02/13/2016   GLUCOSE 98 02/13/2016   CHOL 297* 02/13/2016   TRIG 106.0 02/13/2016   HDL 48.60 02/13/2016   LDLDIRECT 188.6 07/10/2013   LDLCALC 227* 02/13/2016   ALT 15 02/13/2016   AST 16 02/13/2016   NA 140 02/13/2016   K 3.7 02/13/2016   CL 103 02/13/2016   CREATININE 0.86 02/13/2016   BUN 13 02/13/2016    CO2 31 02/13/2016   TSH 4.11 02/13/2016   HGBA1C 5.7 02/13/2016    Dg Hip Complete Left  08/22/2014  CLINICAL DATA:  Pain radiating from her upper leg to left hip for 2 weeks. No known injury. EXAM: LEFT HIP - COMPLETE 2+ VIEW COMPARISON:  None. FINDINGS: There is no evidence of acute fracture or dislocation. There is mild bilateral hip joint space narrowing, left greater than right. Small calcifications in the pelvis likely represent phleboliths. IMPRESSION: No acute osseous abnormality identified. Mild bilateral hip osteoarthrosis. Electronically Signed   By: Logan Bores   On: 08/22/2014 16:46    Assessment & Plan:   Copelynn was seen today for anemia and hypertension.  Diagnoses and all orders for this visit:  Other abnormal glucose- she has prediabetes and agrees to work on her lifestyle modifications. -     Hemoglobin  A1c; Future  Essential hypertension, benign- her blood pressure is well-controlled, electrolytes and renal function are stable. -     CBC with Differential/Platelet; Future -     Comprehensive metabolic panel; Future  B12 deficiency anemia- improvement noted -     Vitamin B12; Future -     CBC with Differential/Platelet; Future -     Folate; Future -     Ferritin; Future  Hyperlipidemia with target LDL less than 130- her LDL is 227, I will start high-dose, high potency statin therapy to reduce her risk of heart attack and stroke. -     TSH; Future -     Lipid panel; Future -     atorvastatin (LIPITOR) 80 MG tablet; Take 1 tablet (80 mg total) by mouth daily.  Myalgia and myositis- Her electrolytes are normal, her CK and sedimentation rate are normal, she does not appear to have an inflammatory process or myopathy. Her symptoms are most consistent with degenerative joint disease and myofascial pain syndrome, she will continue applying heat and taking anti-inflammatories as needed. -     Comprehensive metabolic panel; Future -     CK; Future -     Sedimentation  rate; Future -     Magnesium; Future   I have discontinued Ms. Lie's estradiol-norethindrone and traMADol. I am also having her start on atorvastatin. Additionally, I am having her maintain her Senna-Psyllium (PERDIEM PO), cholecalciferol, ALPRAZolam, hydrochlorothiazide, Azelastine HCl, potassium chloride SA, DULoxetine HCl, and triamcinolone cream.  Meds ordered this encounter  Medications  . atorvastatin (LIPITOR) 80 MG tablet    Sig: Take 1 tablet (80 mg total) by mouth daily.    Dispense:  90 tablet    Refill:  1     Follow-up: Return in about 4 weeks (around 03/12/2016).  Scarlette Calico, MD

## 2016-02-13 NOTE — Progress Notes (Signed)
Pre visit review using our clinic review tool, if applicable. No additional management support is needed unless otherwise documented below in the visit note. 

## 2016-02-17 ENCOUNTER — Other Ambulatory Visit: Payer: Self-pay | Admitting: Internal Medicine

## 2016-03-23 ENCOUNTER — Other Ambulatory Visit: Payer: Self-pay | Admitting: Internal Medicine

## 2016-03-23 ENCOUNTER — Encounter: Payer: Self-pay | Admitting: Internal Medicine

## 2016-06-15 ENCOUNTER — Ambulatory Visit (INDEPENDENT_AMBULATORY_CARE_PROVIDER_SITE_OTHER): Payer: BC Managed Care – PPO | Admitting: Family Medicine

## 2016-06-15 ENCOUNTER — Encounter: Payer: Self-pay | Admitting: Family Medicine

## 2016-06-15 VITALS — BP 146/84 | HR 89 | Temp 98.9°F | Ht 65.0 in | Wt 211.3 lb

## 2016-06-15 DIAGNOSIS — R8290 Unspecified abnormal findings in urine: Secondary | ICD-10-CM | POA: Diagnosis not present

## 2016-06-15 DIAGNOSIS — I10 Essential (primary) hypertension: Secondary | ICD-10-CM | POA: Diagnosis not present

## 2016-06-15 DIAGNOSIS — N3090 Cystitis, unspecified without hematuria: Secondary | ICD-10-CM | POA: Diagnosis not present

## 2016-06-15 LAB — POC URINALSYSI DIPSTICK (AUTOMATED)
BILIRUBIN UA: NEGATIVE
GLUCOSE UA: NEGATIVE
NITRITE UA: NEGATIVE
Spec Grav, UA: 1.02
UROBILINOGEN UA: 0.2
pH, UA: 6.5

## 2016-06-15 MED ORDER — SULFAMETHOXAZOLE-TRIMETHOPRIM 800-160 MG PO TABS: 1.0000 | ORAL_TABLET | Freq: Two times a day (BID) | ORAL | Status: DC

## 2016-06-15 NOTE — Patient Instructions (Signed)
Please take antibiotic as directed and follow up if symptoms do not improve in 2 to 3 days, worsen, or you develop a fever >101. Also, please keep appointment with your gynecologist in 2 days. Resume taking your HCTZ today for BP control.  Urinary Tract Infection Urinary tract infections (UTIs) can develop anywhere along your urinary tract. Your urinary tract is your body's drainage system for removing wastes and extra water. Your urinary tract includes two kidneys, two ureters, a bladder, and a urethra. Your kidneys are a pair of bean-shaped organs. Each kidney is about the size of your fist. They are located below your ribs, one on each side of your spine. CAUSES Infections are caused by microbes, which are microscopic organisms, including fungi, viruses, and bacteria. These organisms are so small that they can only be seen through a microscope. Bacteria are the microbes that most commonly cause UTIs. SYMPTOMS  Symptoms of UTIs may vary by age and gender of the patient and by the location of the infection. Symptoms in young women typically include a frequent and intense urge to urinate and a painful, burning feeling in the bladder or urethra during urination. Older women and men are more likely to be tired, shaky, and weak and have muscle aches and abdominal pain. A fever may mean the infection is in your kidneys. Other symptoms of a kidney infection include pain in your back or sides below the ribs, nausea, and vomiting. DIAGNOSIS To diagnose a UTI, your caregiver will ask you about your symptoms. Your caregiver will also ask you to provide a urine sample. The urine sample will be tested for bacteria and white blood cells. White blood cells are made by your body to help fight infection. TREATMENT  Typically, UTIs can be treated with medication. Because most UTIs are caused by a bacterial infection, they usually can be treated with the use of antibiotics. The choice of antibiotic and length of  treatment depend on your symptoms and the type of bacteria causing your infection. HOME CARE INSTRUCTIONS  If you were prescribed antibiotics, take them exactly as your caregiver instructs you. Finish the medication even if you feel better after you have only taken some of the medication.  Drink enough water and fluids to keep your urine clear or pale yellow.  Avoid caffeine, tea, and carbonated beverages. They tend to irritate your bladder.  Empty your bladder often. Avoid holding urine for long periods of time.  Empty your bladder before and after sexual intercourse.  After a bowel movement, women should cleanse from front to back. Use each tissue only once. SEEK MEDICAL CARE IF:   You have back pain.  You develop a fever.  Your symptoms do not begin to resolve within 3 days. SEEK IMMEDIATE MEDICAL CARE IF:   You have severe back pain or lower abdominal pain.  You develop chills.  You have nausea or vomiting.  You have continued burning or discomfort with urination. MAKE SURE YOU:   Understand these instructions.  Will watch your condition.  Will get help right away if you are not doing well or get worse.   This information is not intended to replace advice given to you by your health care provider. Make sure you discuss any questions you have with your health care provider.   Document Released: 09/09/2005 Document Revised: 08/21/2015 Document Reviewed: 01/08/2012 Elsevier Interactive Patient Education [employer].

## 2016-06-15 NOTE — Progress Notes (Signed)
Pre visit review using our clinic review tool, if applicable. No additional management support is needed unless otherwise documented below in the visit note. 

## 2016-06-15 NOTE — Progress Notes (Signed)
Subjective:    Patient ID: Paula Meyer, female    DOB: Sep 30, 1951, 65 y.o.   MRN: [id number]  HPI  Ms. Zumpano is a 65 year old female who presents today with urinary frequency for approximately 1 month.  Associated symptoms of urinary burning and suprapubic pain which  have recently started within the past 2 days.  She denies fever, chills, sweats, hematuria, and flank pain.  No treatment at home at this time.  Denies history of recurrent UTIs, pyelonephritis, or stones.   Patient reports stopping her HCTZ for 3 days because she felt her symptoms may be related to this medication. She also reports using different herbal supplements but cannot recall what type. She denies chest pain, palpitations, SOB, dyspnea, claudication, edema, numbness, tingling, headaches, or nosebleed.   Review of Systems  Constitutional: Negative for fever and chills.  Respiratory: Negative for cough and wheezing.   Cardiovascular: Negative for chest pain and palpitations.  Genitourinary: Positive for urgency and frequency. Negative for hematuria, flank pain and vaginal pain.  Musculoskeletal: Negative for myalgias.  Skin: Negative for rash.  Neurological: Negative for numbness and headaches.   Past Medical History  Diagnosis Date  . Allergy   . Asthma   . Depression   . Hyperlipidemia   . Hypertension   . Hx of colonic polyps   . Osteoarthritis   . Anxiety      Social History   Social History  . Marital Status: Divorced    Spouse Name: N/A  . Number of Children: N/A  . Years of Education: N/A   Occupational History  . Not on file.   Social History Main Topics  . Smoking status: Never Smoker   . Smokeless tobacco: Never Used  . Alcohol Use: 1.8 oz/week    3 Glasses of wine per week     Comment: rarely  . Drug Use: No  . Sexual Activity: Not Currently   Other Topics Concern  . Not on file   Social History Narrative    Past Surgical History  Procedure Laterality Date  . Tubal  ligation      Family History  Problem Relation Age of Onset  . Arthritis Other   . Depression Other   . Hyperlipidemia Brother   . Hypertension Brother     Allergies  Allergen Reactions  . Paxil [Paroxetine Hcl]     Makes her grind her teeth  . Bupropion Hcl Other (See Comments)    palpitations  . Contrast Media [Iodinated Diagnostic Agents] Hives and Itching  . Tetracycline Nausea Only    Current Outpatient Prescriptions on File Prior to Visit  Medication Sig Dispense Refill  . ALPRAZolam (XANAX) 0.25 MG tablet Take 1 tablet (0.25 mg total) by mouth at bedtime as needed for anxiety. 30 tablet 5  . Azelastine HCl (ASTEPRO) 0.15 % SOLN USE 1 SPRAY IN THE NOSE TWICE DAILY 30 mL 11  . DULoxetine HCl 40 MG CPEP Take 40 mg by mouth daily. 30 capsule 1  . hydrochlorothiazide (HYDRODIURIL) 25 MG tablet TAKE 1 TABLET (25 MG TOTAL) BY MOUTH DAILY. 90 tablet 2  . naproxen sodium (ANAPROX) 550 MG tablet TAKE 1 TABLET (550 MG TOTAL) BY MOUTH 2 (TWO) TIMES DAILY WITH A MEAL. 60 tablet 1  . triamcinolone cream (KENALOG) 0.1 % Apply 1 application topically 2 (two) times daily. 30 g 0  . atorvastatin (LIPITOR) 80 MG tablet Take 1 tablet (80 mg total) by mouth daily. (Patient not taking: Reported  on 06/15/2016) 90 tablet 1  . cholecalciferol (VITAMIN D) 1000 UNITS tablet Take 1,000 Units by mouth daily. Reported on 06/15/2016    . potassium chloride SA (K-DUR,KLOR-CON) 20 MEQ tablet Take 0.5 tablets (10 mEq total) by mouth daily. As needed (Patient not taking: Reported on 06/15/2016) 90 tablet 3  . Senna-Psyllium (PERDIEM PO) Take 3 tablets by mouth once. Reported on 06/15/2016     No current facility-administered medications on file prior to visit.    BP 146/84 mmHg  Pulse 89  Temp(Src) 98.9 F (37.2 C) (Oral)  Ht 5\' 5"  (1.651 m)  Wt 211 lb 4.8 oz (95.845 kg)  BMI 35.16 kg/m2  SpO2 98%  LMP 04/02/2011        Objective:   Physical Exam  Constitutional: She is oriented to person, place,  and time. She appears well-developed and well-nourished.  Cardiovascular: Normal rate, regular rhythm and intact distal pulses.   Pulmonary/Chest: Effort normal and breath sounds normal. She has no wheezes. She has no rales.  Abdominal: Soft. Bowel sounds are normal. There is no tenderness. There is no CVA tenderness.  Mild suprapubic tenderness present  Neurological: She is alert and oriented to person, place, and time.  Skin: Skin is warm and dry. No rash noted.  Psychiatric: She has a normal mood and affect. Her behavior is normal. Judgment and thought content normal.          Assessment & Plan:  1. Cystitis No fever, flank pain, or history of recurrent UTIs or pyelonephritis. UA is positive for a trace of leukocytes and trace of blood. Suspect possible early developing UTI. Empirically treat and advise follow up if symptoms do not improve in 2 to 3 days, worsen, or she develops a fever >101. Advised patient to keep her appointment with her gynecologist as scheduled.   - sulfamethoxazole-trimethoprim (BACTRIM DS,SEPTRA DS) 800-160 MG tablet; Take 1 tablet by mouth 2 (two) times daily.  Dispense: 14 tablet; Refill: 0  2. Urine abnormality  - POCT Urinalysis Dipstick (Automated)  3. Essential hypertension Advised patient to resume her HCTZ as prescribed by her PCP. Discussed that her symptoms are not likely related to this medication as this was her reason for stopping this med.  Follow up if symptoms do not improve, worsen, or she develops a fever >101.  Delano Metz, FNP-C

## 2016-06-22 LAB — BASIC METABOLIC PANEL
BUN: 14 mg/dL (ref 4–21)
BUN: 14 mg/dL (ref 4–21)
Creatinine: 1.2 mg/dL — AB (ref 0.5–1.1)
Creatinine: 1.2 mg/dL — AB (ref 0.5–1.1)
GLUCOSE: 95 mg/dL
Glucose: 95 mg/dL
POTASSIUM: 3.9 mmol/L (ref 3.4–5.3)
POTASSIUM: 3.9 mmol/L (ref 3.4–5.3)
SODIUM: 138 mmol/L (ref 137–147)
SODIUM: 138 mmol/L (ref 137–147)

## 2016-06-22 LAB — HEPATIC FUNCTION PANEL
ALT: 15 U/L (ref 7–35)
ALT: 16 U/L (ref 7–35)
AST: 15 U/L (ref 13–35)
AST: 16 U/L (ref 13–35)
Alkaline Phosphatase: 70 U/L (ref 25–125)
Bilirubin, Total: 0.3 mg/dL

## 2016-06-22 LAB — CBC AND DIFFERENTIAL
HCT: 33 % — AB (ref 36–46)
HEMOGLOBIN: 11.6 g/dL — AB (ref 12.0–16.0)
Platelets: 203 10*3/uL (ref 150–399)
WBC: 3.2 10^3/mL

## 2016-06-26 ENCOUNTER — Ambulatory Visit: Payer: BC Managed Care – PPO | Admitting: Internal Medicine

## 2016-07-02 ENCOUNTER — Encounter: Payer: Self-pay | Admitting: Internal Medicine

## 2016-07-02 ENCOUNTER — Ambulatory Visit (INDEPENDENT_AMBULATORY_CARE_PROVIDER_SITE_OTHER): Payer: BC Managed Care – PPO | Admitting: Internal Medicine

## 2016-07-02 ENCOUNTER — Other Ambulatory Visit (INDEPENDENT_AMBULATORY_CARE_PROVIDER_SITE_OTHER): Payer: BC Managed Care – PPO

## 2016-07-02 VITALS — BP 128/80 | HR 78 | Temp 98.4°F | Resp 16 | Ht 65.0 in | Wt 209.0 lb

## 2016-07-02 DIAGNOSIS — M5136 Other intervertebral disc degeneration, lumbar region: Secondary | ICD-10-CM | POA: Diagnosis not present

## 2016-07-02 DIAGNOSIS — I10 Essential (primary) hypertension: Secondary | ICD-10-CM

## 2016-07-02 DIAGNOSIS — N390 Urinary tract infection, site not specified: Secondary | ICD-10-CM | POA: Diagnosis not present

## 2016-07-02 DIAGNOSIS — N183 Chronic kidney disease, stage 3 unspecified: Secondary | ICD-10-CM

## 2016-07-02 LAB — POC URINALSYSI DIPSTICK (AUTOMATED)
BILIRUBIN UA: NEGATIVE
GLUCOSE UA: NEGATIVE
Ketones, UA: NEGATIVE
Leukocytes, UA: NEGATIVE
NITRITE UA: NEGATIVE
Protein, UA: NEGATIVE
RBC UA: NEGATIVE
SPEC GRAV UA: 1.025
Urobilinogen, UA: 0.2
pH, UA: 6

## 2016-07-02 LAB — URINALYSIS, ROUTINE W REFLEX MICROSCOPIC
BILIRUBIN URINE: NEGATIVE
HGB URINE DIPSTICK: NEGATIVE
KETONES UR: NEGATIVE
LEUKOCYTES UA: NEGATIVE
NITRITE: NEGATIVE
RBC / HPF: NONE SEEN (ref 0–?)
Specific Gravity, Urine: 1.01 (ref 1.000–1.030)
Total Protein, Urine: NEGATIVE
URINE GLUCOSE: NEGATIVE
Urobilinogen, UA: 0.2 (ref 0.0–1.0)
pH: 6.5 (ref 5.0–8.0)

## 2016-07-02 MED ORDER — NEBIVOLOL HCL 10 MG PO TABS: 10.0000 mg | ORAL_TABLET | Freq: Every day | ORAL | Status: DC

## 2016-07-02 MED ORDER — TRAMADOL HCL 50 MG PO TABS: 50.0000 mg | ORAL_TABLET | Freq: Three times a day (TID) | ORAL | Status: DC | PRN

## 2016-07-02 MED ORDER — METHOCARBAMOL 500 MG PO TABS: 500.0000 mg | ORAL_TABLET | Freq: Three times a day (TID) | ORAL | Status: DC | PRN

## 2016-07-02 NOTE — Progress Notes (Signed)
Pre visit review using our clinic review tool, if applicable. No additional management support is needed unless otherwise documented below in the visit note. 

## 2016-07-02 NOTE — Progress Notes (Signed)
Subjective:  Patient ID: Paula Meyer, female    DOB: 30-Mar-1951  Age: 65 y.o. MRN: [id number]  CC: Hypertension and Back Pain   HPI Paula Meyer presents for concerns about a recent bladder infection as well as low back pain. She was seen a few weeks ago elsewhere with a concern that she may have had a bladder infection and took a course of antibiotics. Her symptoms have not improved. He urinalysis was rather unremarkable. Her complaint is actually chronic, intermittent low back pain. The pain is an intermittent ache in her lower back with no radiation and no paresthesias in her lower extremities. She's had this off and on for many years and several years ago she had a plain x-ray done that showed degenerative disc disease. She has been getting symptom relief with naproxen but she is concerned because she recently went a the bariatric clinic and they checked her blood work and shoulder she had some signs of renal insufficiency with a creatinine above baseline in a GFR down to 50. She tells me her urine is yellow and she denies dysuria, hematuria, urgency, or frequency.  Outpatient Prescriptions Prior to Visit  Medication Sig Dispense Refill  . ALPRAZolam (XANAX) 0.25 MG tablet Take 1 tablet (0.25 mg total) by mouth at bedtime as needed for anxiety. 30 tablet 5  . Azelastine HCl (ASTEPRO) 0.15 % SOLN USE 1 SPRAY IN THE NOSE TWICE DAILY 30 mL 11  . DULoxetine HCl 40 MG CPEP Take 40 mg by mouth daily. 30 capsule 1  . potassium chloride SA (K-DUR,KLOR-CON) 20 MEQ tablet Take 0.5 tablets (10 mEq total) by mouth daily. As needed 90 tablet 3  . Senna-Psyllium (PERDIEM PO) Take 3 tablets by mouth once. Reported on 06/15/2016    . triamcinolone cream (KENALOG) 0.1 % Apply 1 application topically 2 (two) times daily. 30 g 0  . hydrochlorothiazide (HYDRODIURIL) 25 MG tablet TAKE 1 TABLET (25 MG TOTAL) BY MOUTH DAILY. 90 tablet 2  . naproxen sodium (ANAPROX) 550 MG tablet TAKE 1 TABLET (550 MG TOTAL) BY  MOUTH 2 (TWO) TIMES DAILY WITH A MEAL. 60 tablet 1  . sulfamethoxazole-trimethoprim (BACTRIM DS,SEPTRA DS) 800-160 MG tablet Take 1 tablet by mouth 2 (two) times daily. 14 tablet 0  . atorvastatin (LIPITOR) 80 MG tablet Take 1 tablet (80 mg total) by mouth daily. (Patient not taking: Reported on 07/02/2016) 90 tablet 1  . cholecalciferol (VITAMIN D) 1000 UNITS tablet Take 1,000 Units by mouth daily. Reported on 07/02/2016     No facility-administered medications prior to visit.    ROS Review of Systems  Constitutional: Negative for activity change, appetite change, chills, fatigue, fever and unexpected weight change.  HENT: Negative.  Negative for trouble swallowing.   Eyes: Negative.  Negative for visual disturbance.  Respiratory: Negative.  Negative for choking, chest tightness, shortness of breath and stridor.   Cardiovascular: Negative.  Negative for chest pain, palpitations and leg swelling.  Gastrointestinal: Negative.  Negative for abdominal pain, diarrhea, nausea and vomiting.  Endocrine: Negative.   Genitourinary: Negative.  Negative for decreased urine volume, difficulty urinating, dysuria, flank pain, frequency, hematuria, pelvic pain and urgency.  Musculoskeletal: Positive for back pain. Negative for arthralgias, joint swelling, myalgias and neck pain.  Skin: Negative.   Allergic/Immunologic: Negative.   Neurological: Negative.  Negative for dizziness, weakness, light-headedness, numbness and headaches.  Hematological: Negative.  Negative for adenopathy. Does not bruise/bleed easily.  Psychiatric/Behavioral: Negative.     Objective:  BP 128/80  mmHg  Pulse 78  Temp(Src) 98.4 F (36.9 C) (Oral)  Resp 16  Ht 5\' 5"  (1.651 m)  Wt 209 lb (94.802 kg)  BMI 34.78 kg/m2  SpO2 98%  LMP 04/02/2011  BP Readings from Last 3 Encounters:  07/02/16 128/80  06/15/16 146/84  02/13/16 120/80    Wt Readings from Last 3 Encounters:  07/02/16 209 lb (94.802 kg)  06/15/16 211 lb 4.8  oz (95.845 kg)  02/13/16 206 lb (93.441 kg)    Physical Exam  Constitutional: She is oriented to person, place, and time. No distress.  HENT:  Head: Normocephalic and atraumatic.  Mouth/Throat: Oropharynx is clear and moist. No oropharyngeal exudate.  Eyes: Conjunctivae are normal. Right eye exhibits no discharge. Left eye exhibits no discharge. No scleral icterus.  Neck: Normal range of motion. Neck supple. No JVD present. No tracheal deviation present. No thyromegaly present.  Cardiovascular: Normal rate, regular rhythm, normal heart sounds and intact distal pulses.  Exam reveals no gallop and no friction rub.   No murmur heard. Pulmonary/Chest: Effort normal and breath sounds normal. No stridor. No respiratory distress. She has no wheezes. She has no rales. She exhibits no tenderness.  Abdominal: Soft. Bowel sounds are normal. She exhibits no distension and no mass. There is no tenderness. There is no rebound and no guarding.  Musculoskeletal: Normal range of motion. She exhibits no edema, tenderness or deformity.       Lumbar back: She exhibits spasm. She exhibits normal range of motion, no tenderness, no bony tenderness, no swelling, no edema, no deformity, no pain and normal pulse.  She has bilateral paravertebral spasms  Lymphadenopathy:    She has no cervical adenopathy.  Neurological: She is oriented to person, place, and time. She has normal strength. She displays no atrophy, no tremor and normal reflexes. No cranial nerve deficit or sensory deficit. She exhibits normal muscle tone. She displays a negative Romberg sign. She displays no seizure activity. Coordination and gait normal.  Reflex Scores:      Tricep reflexes are 1+ on the right side and 1+ on the left side.      Bicep reflexes are 1+ on the right side and 1+ on the left side.      Brachioradialis reflexes are 1+ on the right side and 1+ on the left side.      Patellar reflexes are 1+ on the right side and 1+ on the left  side.      Achilles reflexes are 1+ on the right side and 1+ on the left side. SLE neg in BLE  Skin: Skin is warm and dry. No rash noted. She is not diaphoretic. No erythema. No pallor.  Vitals reviewed.   Lab Results  Component Value Date   WBC 3.2 06/22/2016   HGB 11.6* 06/22/2016   HCT 33* 06/22/2016   PLT 203 06/22/2016   GLUCOSE 98 02/13/2016   CHOL 297* 02/13/2016   TRIG 106.0 02/13/2016   HDL 48.60 02/13/2016   LDLDIRECT 188.6 07/10/2013   LDLCALC 227* 02/13/2016   ALT 15 06/22/2016   ALT 16 06/22/2016   AST 16 06/22/2016   AST 15 06/22/2016   NA 138 06/22/2016   NA 138 06/22/2016   K 3.9 06/22/2016   K 3.9 06/22/2016   CL 103 02/13/2016   CREATININE 1.2* 06/22/2016   CREATININE 1.2* 06/22/2016   BUN 14 06/22/2016   BUN 14 06/22/2016   CO2 31 02/13/2016   TSH 4.11 02/13/2016   HGBA1C  5.7 02/13/2016    Dg Hip Complete Left  08/22/2014  CLINICAL DATA:  Pain radiating from her upper leg to left hip for 2 weeks. No known injury. EXAM: LEFT HIP - COMPLETE 2+ VIEW COMPARISON:  None. FINDINGS: There is no evidence of acute fracture or dislocation. There is mild bilateral hip joint space narrowing, left greater than right. Small calcifications in the pelvis likely represent phleboliths. IMPRESSION: No acute osseous abnormality identified. Mild bilateral hip osteoarthrosis. Electronically Signed   By: Logan Bores   On: 08/22/2014 16:46    Assessment & Plan:   Mckenzye was seen today for hypertension and back pain.  Diagnoses and all orders for this visit:  Essential hypertension, benign- due to her rising creatinine I've asked her to discontinue the thiazide diuretic and will control her blood pressure with nebivolol. -     nebivolol (BYSTOLIC) 10 MG tablet; Take 1 tablet (10 mg total) by mouth daily.  Acute UTI (urinary tract infection)- she has no suspicious symptoms and her urinalysis and urine culture today are normal, her symptoms are not related to her urinary  tract infection. -     Urinalysis, Routine w reflex microscopic (not at [facility]); Future -     CULTURE, URINE COMPREHENSIVE; Future -     POCT Urinalysis Dipstick (Automated)  DDD (degenerative disc disease), lumbar- due to her recent development of renal insufficiency have asked her to stop taking naproxen and will try to control her pain with tramadol and Robaxin as needed. -     traMADol (ULTRAM) 50 MG tablet; Take 1 tablet (50 mg total) by mouth every 8 (eight) hours as needed. -     methocarbamol (ROBAXIN) 500 MG tablet; Take 1 tablet (500 mg total) by mouth every 8 (eight) hours as needed for muscle spasms.  Kidney disease, chronic, stage III (GFR 30-59 ml/min)- I've asked her to discontinue the nephrotoxic agents (anti-inflammatories and thiazide diuretics).   I have discontinued Ms. Vaquerano's hydrochlorothiazide, naproxen sodium, and sulfamethoxazole-trimethoprim. I am also having her start on traMADol, methocarbamol, and nebivolol. Additionally, I am having her maintain her Senna-Psyllium (PERDIEM PO), cholecalciferol, ALPRAZolam, Azelastine HCl, potassium chloride SA, DULoxetine HCl, triamcinolone cream, atorvastatin, and MIMVEY.  Meds ordered this encounter  Medications  . MIMVEY 1-0.5 MG tablet    Sig: Take 1 tablet by mouth daily. prn    Refill:  3  . traMADol (ULTRAM) 50 MG tablet    Sig: Take 1 tablet (50 mg total) by mouth every 8 (eight) hours as needed.    Dispense:  75 tablet    Refill:  3  . methocarbamol (ROBAXIN) 500 MG tablet    Sig: Take 1 tablet (500 mg total) by mouth every 8 (eight) hours as needed for muscle spasms.    Dispense:  75 tablet    Refill:  3  . nebivolol (BYSTOLIC) 10 MG tablet    Sig: Take 1 tablet (10 mg total) by mouth daily.    Dispense:  30 tablet    Refill:  11     Follow-up: Return in about 2 months (around 09/02/2016).  Scarlette Calico, MD

## 2016-07-02 NOTE — Patient Instructions (Signed)

## 2016-07-05 LAB — CULTURE, URINE COMPREHENSIVE: Colony Count: 1000

## 2016-07-07 ENCOUNTER — Encounter: Payer: Self-pay | Admitting: Internal Medicine

## 2016-07-22 ENCOUNTER — Other Ambulatory Visit: Payer: Self-pay | Admitting: Internal Medicine

## 2016-07-22 DIAGNOSIS — F418 Other specified anxiety disorders: Secondary | ICD-10-CM

## 2016-07-23 NOTE — Telephone Encounter (Signed)
Faxed script to [facility].../lmb 

## 2016-09-02 ENCOUNTER — Ambulatory Visit (INDEPENDENT_AMBULATORY_CARE_PROVIDER_SITE_OTHER): Payer: BC Managed Care – PPO | Admitting: Physician Assistant

## 2016-09-02 ENCOUNTER — Ambulatory Visit (INDEPENDENT_AMBULATORY_CARE_PROVIDER_SITE_OTHER): Payer: BC Managed Care – PPO

## 2016-09-02 VITALS — BP 130/80 | HR 91 | Temp 98.7°F | Resp 17 | Ht 64.5 in | Wt 190.0 lb

## 2016-09-02 DIAGNOSIS — R05 Cough: Secondary | ICD-10-CM | POA: Diagnosis not present

## 2016-09-02 DIAGNOSIS — R0602 Shortness of breath: Secondary | ICD-10-CM | POA: Diagnosis not present

## 2016-09-02 DIAGNOSIS — R058 Other specified cough: Secondary | ICD-10-CM

## 2016-09-02 LAB — POCT CBC
Granulocyte percent: 49 %G (ref 37–80)
HEMATOCRIT: 34.9 % — AB (ref 37.7–47.9)
HEMOGLOBIN: 12 g/dL — AB (ref 12.2–16.2)
LYMPH, POC: 1.5 (ref 0.6–3.4)
MCH: 27.8 pg (ref 27–31.2)
MCHC: 34.5 g/dL (ref 31.8–35.4)
MCV: 80.5 fL (ref 80–97)
MID (CBC): 0.1 (ref 0–0.9)
MPV: 8.1 fL (ref 0–99.8)
POC GRANULOCYTE: 1.5 — AB (ref 2–6.9)
POC LYMPH PERCENT: 48.2 %L (ref 10–50)
POC MID %: 2.8 % (ref 0–12)
Platelet Count, POC: 176 10*3/uL (ref 142–424)
RBC: 4.33 M/uL (ref 4.04–5.48)
RDW, POC: 14.6 %
WBC: 3.1 10*3/uL — AB (ref 4.6–10.2)

## 2016-09-02 MED ORDER — ALBUTEROL SULFATE HFA 108 (90 BASE) MCG/ACT IN AERS: 2.0000 | INHALATION_SPRAY | RESPIRATORY_TRACT | 1 refills | Status: DC | PRN

## 2016-09-02 NOTE — Progress Notes (Signed)
Patient ID: Paula Meyer, female   DOB: June 22, 1951, 65 y.o.   MRN: [id number] [facility] [address] [place] [address] [phone]  By signing my name below I, Tereasa Coop, attest that this documentation has been prepared under the direction and in the presence of Ivar Drape PA. Electonically Signed. Tereasa Coop, Scribe 09/02/2016 at 12:55 PM   Date:  09/02/2016   Name:  Paula Meyer   DOB:  [birth year]   MRN:  [id number]  PCP:  Scarlette Calico, MD   Chief Complaint  Patient presents with   Shortness of Breath   Back Pain     History of Present Illness:  Paula Meyer is a 65 y.o. female patient who presents to [facility] c/o SOB for the past 2-3 weeks. SOB has been intermittent and worse when she gets off work. Pt states it occurs with exertion and sometimes without exertion. Pt gets SOB from talking. Pt also reports a mid back pain and a CP that is worsened with deep breaths. Back pain started prior to CP and pt reports that her back pain "climbed up to [her] chest" about 2 days ago. Pt states that the CP and back pain do not prevent her from breathing and are separate from her SOB. Pt has also been having mildly productive cough. Pt unsure if phlegm has any color to it. Pt denies any diaphoresis, numbness, tingling, dizziness, or lightheadedness. Pt has history of asthma and states she has had similar symptoms when she had an asthma flare up.   Pt reports mild itch to her ear canals bilat. Pt also reports having dry skin overall and states that she does not use lotion or moisturize her skin regularly. Pt takes regular hot showers. Pt denies any scalp itching.   Pt states she stopped taking her cholesterol medication because she has been "dieting for the past 2 months and eating healthy", with the exception of eating more salt than recommended. Pt also reports that she does not like taking her bystolic because she does not like the way it makes her  feel. Pt was on HCTZ until her PCP took her off it due to elevated creatinine levels in July 2017. Pt requesting clonidine to control her CP.   Pt was seen by her PCP Dr Ronnald Ramp on 07/02/16 for her back pain which was a flare up of her chronic lumbar DDD and was given 3 refills of 75 50mg  tramadol.   Pt has a follow up appointment with her PCP in 3 weeks.    Patient Active Problem List   Diagnosis Date Noted   Acute UTI (urinary tract infection) 07/02/2016   Kidney disease, chronic, stage III (GFR 30-59 ml/min) 07/02/2016   Myalgia and myositis 02/13/2016   Ganglion cyst of flexor tendon sheath of finger of right hand 08/22/2014   Other screening mammogram 07/10/2013   B12 deficiency anemia 08/24/2012   Other abnormal glucose 08/23/2012   GERD (gastroesophageal reflux disease) 06/30/2012   Eczema 05/04/2012   DDD (degenerative disc disease), lumbar 04/02/2011   Hyperlipidemia with target LDL less than 130 10/20/2010   Depression with anxiety 10/20/2010   Essential hypertension, benign 10/20/2010   ALLERGIC RHINITIS 10/20/2010   Mild persistent asthma 10/20/2010   OSTEOARTHRITIS 10/20/2010  2  Past Medical History:  Diagnosis Date   Allergy    Anxiety    Asthma    Depression    Hx of colonic polyps    Hyperlipidemia  Hypertension    Osteoarthritis     Past Surgical History:  Procedure Laterality Date   TUBAL LIGATION      Social History  Substance Use Topics   Smoking status: Never Smoker   Smokeless tobacco: Never Used   Alcohol use 1.8 oz/week    3 Glasses of wine per week     Comment: rarely    Family History  Problem Relation Age of Onset   Hyperlipidemia Brother    Hypertension Brother    Arthritis Other    Depression Other     Allergies  Allergen Reactions   Paxil [Paroxetine Hcl]     Makes her grind her teeth   Bupropion Hcl Other (See Comments)    palpitations   Contrast Media [Iodinated Diagnostic Agents]  Hives and Itching   Tetracycline Nausea Only    Medication list has been reviewed and updated.  Current Outpatient Prescriptions on File Prior to Visit  Medication Sig Dispense Refill   ALPRAZolam (XANAX) 0.25 MG tablet TAKE 1 TABLET BY MOUTH AT BEDTIME AS NEEDED FOR ANXIETY 30 tablet 5   Azelastine HCl (ASTEPRO) 0.15 % SOLN USE 1 SPRAY IN THE NOSE TWICE DAILY 30 mL 11   DULoxetine HCl 40 MG CPEP Take 40 mg by mouth daily. 30 capsule 1   methocarbamol (ROBAXIN) 500 MG tablet Take 1 tablet (500 mg total) by mouth every 8 (eight) hours as needed for muscle spasms. 75 tablet 3   MIMVEY 1-0.5 MG tablet Take 1 tablet by mouth daily. prn  3   potassium chloride SA (K-DUR,KLOR-CON) 20 MEQ tablet Take 0.5 tablets (10 mEq total) by mouth daily. As needed 90 tablet 3   Senna-Psyllium (PERDIEM PO) Take 3 tablets by mouth once. Reported on 06/15/2016     traMADol (ULTRAM) 50 MG tablet Take 1 tablet (50 mg total) by mouth every 8 (eight) hours as needed. 75 tablet 3   triamcinolone cream (KENALOG) 0.1 % Apply 1 application topically 2 (two) times daily. 30 g 0   atorvastatin (LIPITOR) 80 MG tablet Take 1 tablet (80 mg total) by mouth daily. (Patient not taking: Reported on 09/02/2016) 90 tablet 1   cholecalciferol (VITAMIN D) 1000 UNITS tablet Take 1,000 Units by mouth daily. Reported on 07/02/2016     nebivolol (BYSTOLIC) 10 MG tablet Take 1 tablet (10 mg total) by mouth daily. (Patient not taking: Reported on 09/02/2016) 30 tablet 11   No current facility-administered medications on file prior to visit.     ROS ROS unremarkable unless otherwise specified.  Physical Examination: BP 130/80 (BP Location: Right Arm, Patient Position: Sitting, Cuff Size: Normal)    Pulse 91    Temp 98.7 F (37.1 C) (Oral)    Resp 17    Ht 5' 4.5" (1.638 m)    Wt 190 lb (86.2 kg)    LMP 04/02/2011    SpO2 98%    BMI 32.11 kg/m    Physical Exam  Constitutional: She is oriented to person, place, and time. She  appears well-developed and well-nourished. No distress.  HENT:  Head: Normocephalic and atraumatic.  Right Ear: Tympanic membrane and external ear normal. Tympanic membrane is not perforated, not erythematous, not retracted and not bulging.  Left Ear: Tympanic membrane and external ear normal. Tympanic membrane is not perforated, not erythematous, not retracted and not bulging.  Mouth/Throat: Oropharynx is clear and moist and mucous membranes are normal. No oropharyngeal exudate or posterior oropharyngeal erythema.  Eyes: Conjunctivae and EOM are normal.  Pupils are equal, round, and reactive to light.  Neck: No thyromegaly present.  Cardiovascular: Normal rate, regular rhythm and normal heart sounds.  Exam reveals no gallop and no friction rub.   No murmur heard. Pt has normal Cap refill   Pulmonary/Chest: Effort normal. No accessory muscle usage. No respiratory distress. She has no decreased breath sounds. She has no wheezes. She has no rhonchi. She has no rales.  Lymphadenopathy:       Head (right side): No submandibular adenopathy present.       Head (left side): No submandibular adenopathy present.    She has no cervical adenopathy.  Neurological: She is alert and oriented to person, place, and time.  Skin: She is not diaphoretic.  Psychiatric: She has a normal mood and affect. Her behavior is normal.   Results for orders placed or performed in visit on 09/02/16  POCT CBC  Result Value Ref Range   WBC 3.1 (A) 4.6 - 10.2 K/uL   Lymph, poc 1.5 0.6 - 3.4   POC LYMPH PERCENT 48.2 10 - 50 %L   MID (cbc) 0.1 0 - 0.9   POC MID % 2.8 0 - 12 %M   POC Granulocyte 1.5 (A) 2 - 6.9   Granulocyte percent 49.0 37 - 80 %G   RBC 4.33 4.04 - 5.48 M/uL   Hemoglobin 12.0 (A) 12.2 - 16.2 g/dL   HCT, POC 34.9 (A) 37.7 - 47.9 %   MCV 80.5 80 - 97 fL   MCH, POC 27.8 27 - 31.2 pg   MCHC 34.5 31.8 - 35.4 g/dL   RDW, POC 14.6 %   Platelet Count, POC 176 142 - 424 K/uL   MPV 8.1 0 - 99.8 fL   Peak  flow reading is 350, about 81 % of predicted.  Dg Chest 2 View  Result Date: 09/02/2016 CLINICAL DATA:  Shortness of breath with back pain. Worsened with inspiration. EXAM: CHEST  2 VIEW COMPARISON:  10/20/2010 FINDINGS: Heart size is normal. Mediastinal shadows are normal. The lungs are clear. No bronchial thickening. No infiltrate, mass, effusion or collapse. Pulmonary vascularity is normal. Ordinary degenerative changes affect the spine. IMPRESSION: No active cardiopulmonary disease. Electronically Signed   By: Nelson Chimes M.D.   On: 09/02/2016 12:03   EKG viewed and Interpreted by Collet[facility] [place] English EKG shows sinus rhythm and no acute changes. Pt has inverted T-wave in precordial leads that are unchanged from EKG done in 2007.   Assessment and Plan: Paula Meyer is a 65 y.o. female who is here today for cc of shortness of breath. Asthma diagnosis should be reassessed. I'm referring her to pulmonology. She was given albuterol inhaler today. There is no acute process at this time. Follow-up as needed here or with her primary care Dr. Ronnald Ramp. I have also advised her that she needs to return to taking her medication or she needs to follow-up immediately with Dr. Ronnald Ramp. She is currently not taking any of her medications including her antihypertensives and cholesterol medication. She voiced understanding. I do not think her lack of medication is SOB (shortness of breath) - Plan: EKG 12-Lead, Ambulatory referral to Pulmonology  Productive cough - Plan: POCT CBC, DG Chest 2 View, EKG 12-Lead, Ambulatory referral to Pulmonology  Ivar Drape, PA-C [facility] 9/21/20178:36 AM I personally performed the services described in this documentation, which was scribed in my presence. The recorded information has been reviewed and is accurate.

## 2016-09-02 NOTE — Patient Instructions (Addendum)
I am referring you to pulmonology at this time.  You should make sure that you are following up with your internal medicine for this acute visit.  Also if you would like to switch your medications, you should also set up appointments, before you discontinue medications on your own.  Please use the inhaler as needed.  If you find your sob getting worse, please return.     IF you received an x-ray today, you will receive an invoice from [facility]. Please contact [facility] at [phone] with questions or concerns regarding your invoice.   IF you received labwork today, you will receive an invoice from [employer]. Please contact [facility] at [phone] with questions or concerns regarding your invoice.   Our billing staff will not be able to assist you with questions regarding bills from these companies.  You will be contacted with the lab results as soon as they are available. The fastest way to get your results is to activate your My Chart account. Instructions are located on the last page of this paperwork. If you have not heard from [place] regarding the results in 2 weeks, please contact this office.

## 2016-11-09 ENCOUNTER — Ambulatory Visit (INDEPENDENT_AMBULATORY_CARE_PROVIDER_SITE_OTHER): Payer: BC Managed Care – PPO | Admitting: Internal Medicine

## 2016-11-09 ENCOUNTER — Encounter: Payer: Self-pay | Admitting: Internal Medicine

## 2016-11-09 ENCOUNTER — Ambulatory Visit (INDEPENDENT_AMBULATORY_CARE_PROVIDER_SITE_OTHER)
Admission: RE | Admit: 2016-11-09 | Discharge: 2016-11-09 | Disposition: A | Discharge status: Home / Self Care | Payer: BC Managed Care – PPO | Source: Ambulatory Visit | Attending: Internal Medicine | Admitting: Internal Medicine

## 2016-11-09 ENCOUNTER — Other Ambulatory Visit (INDEPENDENT_AMBULATORY_CARE_PROVIDER_SITE_OTHER): Payer: BC Managed Care – PPO

## 2016-11-09 VITALS — BP 140/80 | HR 68 | Temp 98.0°F | Resp 16 | Ht 64.5 in | Wt 187.2 lb

## 2016-11-09 DIAGNOSIS — K921 Melena: Secondary | ICD-10-CM

## 2016-11-09 DIAGNOSIS — R10817 Generalized abdominal tenderness: Secondary | ICD-10-CM

## 2016-11-09 DIAGNOSIS — I10 Essential (primary) hypertension: Secondary | ICD-10-CM | POA: Diagnosis not present

## 2016-11-09 DIAGNOSIS — D539 Nutritional anemia, unspecified: Secondary | ICD-10-CM

## 2016-11-09 DIAGNOSIS — N183 Chronic kidney disease, stage 3 unspecified: Secondary | ICD-10-CM

## 2016-11-09 DIAGNOSIS — D518 Other vitamin B12 deficiency anemias: Secondary | ICD-10-CM | POA: Diagnosis not present

## 2016-11-09 DIAGNOSIS — R300 Dysuria: Secondary | ICD-10-CM | POA: Diagnosis not present

## 2016-11-09 LAB — LIPASE: LIPASE: 8 U/L — AB (ref 11.0–59.0)

## 2016-11-09 LAB — BASIC METABOLIC PANEL WITH GFR
BUN: 10 mg/dL (ref 6–23)
CO2: 30 meq/L (ref 19–32)
Calcium: 9.6 mg/dL (ref 8.4–10.5)
Chloride: 104 meq/L (ref 96–112)
Creatinine, Ser: 0.81 mg/dL (ref 0.40–1.20)
GFR: 91.76 mL/min
Glucose, Bld: 79 mg/dL (ref 70–99)
Potassium: 3.9 meq/L (ref 3.5–5.1)
Sodium: 140 meq/L (ref 135–145)

## 2016-11-09 LAB — HEPATIC FUNCTION PANEL
ALK PHOS: 70 U/L (ref 39–117)
ALT: 11 U/L (ref 0–35)
AST: 13 U/L (ref 0–37)
Albumin: 4.3 g/dL (ref 3.5–5.2)
BILIRUBIN DIRECT: 0.1 mg/dL (ref 0.0–0.3)
Total Bilirubin: 0.5 mg/dL (ref 0.2–1.2)
Total Protein: 7.6 g/dL (ref 6.0–8.3)

## 2016-11-09 LAB — CBC WITH DIFFERENTIAL/PLATELET
Basophils Absolute: 0 K/uL (ref 0.0–0.1)
Basophils Relative: 0.5 % (ref 0.0–3.0)
Eosinophils Absolute: 0.1 K/uL (ref 0.0–0.7)
Eosinophils Relative: 1.8 % (ref 0.0–5.0)
HCT: 35.7 % — ABNORMAL LOW (ref 36.0–46.0)
Hemoglobin: 11.9 g/dL — ABNORMAL LOW (ref 12.0–15.0)
Lymphocytes Relative: 42.1 % (ref 12.0–46.0)
Lymphs Abs: 1.5 K/uL (ref 0.7–4.0)
MCHC: 33.2 g/dL (ref 30.0–36.0)
MCV: 82.1 fl (ref 78.0–100.0)
Monocytes Absolute: 0.3 K/uL (ref 0.1–1.0)
Monocytes Relative: 7.7 % (ref 3.0–12.0)
Neutro Abs: 1.7 K/uL (ref 1.4–7.7)
Neutrophils Relative %: 47.9 % (ref 43.0–77.0)
Platelets: 211 K/uL (ref 150.0–400.0)
RBC: 4.35 Mil/uL (ref 3.87–5.11)
RDW: 14.4 % (ref 11.5–15.5)
WBC: 3.6 K/uL — ABNORMAL LOW (ref 4.0–10.5)

## 2016-11-09 LAB — IBC PANEL
Iron: 86 ug/dL (ref 42–145)
Saturation Ratios: 24.7 % (ref 20.0–50.0)
Transferrin: 249 mg/dL (ref 212.0–360.0)

## 2016-11-09 LAB — VITAMIN B12: Vitamin B-12: 407 pg/mL (ref 211–911)

## 2016-11-09 LAB — POC URINALSYSI DIPSTICK (AUTOMATED)
Bilirubin, UA: NEGATIVE
Glucose, UA: NEGATIVE
KETONES UA: NEGATIVE
Nitrite, UA: NEGATIVE
PH UA: 7.5
PROTEIN UA: NEGATIVE
RBC UA: NEGATIVE
SPEC GRAV UA: 1.025
UROBILINOGEN UA: 0.2

## 2016-11-09 LAB — AMYLASE: AMYLASE: 24 U/L — AB (ref 27–131)

## 2016-11-09 LAB — C-REACTIVE PROTEIN: CRP: 0.2 mg/dL — ABNORMAL LOW (ref 0.5–20.0)

## 2016-11-09 LAB — FOLATE: Folate: 7.6 ng/mL (ref >5.9)

## 2016-11-09 LAB — FERRITIN: Ferritin: 152 ng/mL (ref 10.0–291.0)

## 2016-11-09 NOTE — Progress Notes (Signed)
Pre visit review using our clinic review tool, if applicable. No additional management support is needed unless otherwise documented below in the visit note. 

## 2016-11-09 NOTE — Patient Instructions (Signed)

## 2016-11-09 NOTE — Progress Notes (Signed)
Subjective:  Patient ID: Paula Meyer, female    DOB: Apr 19, 1951  Age: 65 y.o. MRN: [id number]  CC: Hypertension; Anemia; and Urinary Tract Infection   HPI SHENIECE ATNIP presents for a 10 day history of recurrent episodes of pain around her bladder and dysuria. She had a UTI several months ago and says the symptoms feel similar. She also noticed an episode of blood in her stools about 2 weeks ago. She complains of bilateral, intermittent, lower quadrant abdominal pain. She denies nausea, vomiting, fever, chills, hematuria, flank pain, urgency, or nocturia.  Outpatient Medications Prior to Visit  Medication Sig Dispense Refill  . albuterol (PROVENTIL HFA;VENTOLIN HFA) 108 (90 Base) MCG/ACT inhaler Inhale 2 puffs into the lungs every 4 (four) hours as needed for wheezing or shortness of breath (cough, shortness of breath or wheezing.). 1 Inhaler 1  . ALPRAZolam (XANAX) 0.25 MG tablet TAKE 1 TABLET BY MOUTH AT BEDTIME AS NEEDED FOR ANXIETY 30 tablet 5  . Azelastine HCl (ASTEPRO) 0.15 % SOLN USE 1 SPRAY IN THE NOSE TWICE DAILY 30 mL 11  . methocarbamol (ROBAXIN) 500 MG tablet Take 1 tablet (500 mg total) by mouth every 8 (eight) hours as needed for muscle spasms. 75 tablet 3  . MIMVEY 1-0.5 MG tablet Take 1 tablet by mouth daily. prn  3  . potassium chloride SA (K-DUR,KLOR-CON) 20 MEQ tablet Take 0.5 tablets (10 mEq total) by mouth daily. As needed 90 tablet 3  . traMADol (ULTRAM) 50 MG tablet Take 1 tablet (50 mg total) by mouth every 8 (eight) hours as needed. 75 tablet 3  . triamcinolone cream (KENALOG) 0.1 % Apply 1 application topically 2 (two) times daily. 30 g 0  . Senna-Psyllium (PERDIEM PO) Take 3 tablets by mouth once. Reported on 06/15/2016    . atorvastatin (LIPITOR) 80 MG tablet Take 1 tablet (80 mg total) by mouth daily. (Patient not taking: Reported on 11/09/2016) 90 tablet 1  . cholecalciferol (VITAMIN D) 1000 UNITS tablet Take 1,000 Units by mouth daily. Reported on 07/02/2016     . DULoxetine HCl 40 MG CPEP Take 40 mg by mouth daily. (Patient not taking: Reported on 11/09/2016) 30 capsule 1  . nebivolol (BYSTOLIC) 10 MG tablet Take 1 tablet (10 mg total) by mouth daily. (Patient not taking: Reported on 11/09/2016) 30 tablet 11   No facility-administered medications prior to visit.     ROS Review of Systems  Constitutional: Negative for activity change, appetite change, chills, diaphoresis, fatigue and fever.  HENT: Negative.   Eyes: Negative.  Negative for visual disturbance.  Respiratory: Negative for cough, choking, chest tightness, shortness of breath and stridor.   Cardiovascular: Negative for chest pain, palpitations and leg swelling.  Gastrointestinal: Positive for abdominal pain and blood in stool. Negative for diarrhea, nausea and vomiting.  Endocrine: Negative.   Genitourinary: Positive for dysuria. Negative for decreased urine volume, difficulty urinating, flank pain, frequency, hematuria and urgency.  Musculoskeletal: Negative for arthralgias, back pain, myalgias and neck pain.  Skin: Negative.  Negative for color change, pallor and rash.  Allergic/Immunologic: Negative.   Neurological: Negative.   Hematological: Negative.  Negative for adenopathy. Does not bruise/bleed easily.  Psychiatric/Behavioral: Negative.     Objective:  BP 140/80 (BP Location: Left Arm, Patient Position: Sitting, Cuff Size: Normal)   Pulse 68   Temp 98 F (36.7 C) (Oral)   Resp 16   Ht 5' 4.5" (1.638 m)   Wt 187 lb 4 oz (84.9 kg)  LMP 04/02/2011   SpO2 98%   BMI 31.65 kg/m   BP Readings from Last 3 Encounters:  11/09/16 140/80  09/02/16 130/80  07/02/16 128/80    Wt Readings from Last 3 Encounters:  11/09/16 187 lb 4 oz (84.9 kg)  09/02/16 190 lb (86.2 kg)  07/02/16 209 lb (94.8 kg)    Physical Exam  Constitutional: She is oriented to person, place, and time. No distress.  HENT:  Mouth/Throat: Oropharynx is clear and moist. No oropharyngeal exudate.    Eyes: Conjunctivae are normal. Right eye exhibits no discharge. Left eye exhibits no discharge. No scleral icterus.  Neck: Normal range of motion. Neck supple. No JVD present. No tracheal deviation present. No thyromegaly present.  Cardiovascular: Normal rate, regular rhythm, normal heart sounds and intact distal pulses.  Exam reveals no gallop and no friction rub.   No murmur heard. Pulmonary/Chest: Effort normal and breath sounds normal. No stridor. No respiratory distress. She has no wheezes. She has no rales. She exhibits no tenderness.  Abdominal: Soft. Bowel sounds are normal. She exhibits no distension and no mass. There is no hepatosplenomegaly. There is tenderness in the right lower quadrant and left upper quadrant. There is no rigidity, no rebound, no guarding, no CVA tenderness, no tenderness at McBurney's point and negative Murphy's sign.  Musculoskeletal: Normal range of motion. She exhibits no edema, tenderness or deformity.  Lymphadenopathy:    She has no cervical adenopathy.  Neurological: She is oriented to person, place, and time.  Skin: Skin is warm and dry. No rash noted. She is not diaphoretic. No erythema. No pallor.  Vitals reviewed.   Lab Results  Component Value Date   WBC 3.6 (L) 11/09/2016   HGB 11.9 (L) 11/09/2016   HCT 35.7 (L) 11/09/2016   PLT 211.0 11/09/2016   GLUCOSE 79 11/09/2016   CHOL 297 (H) 02/13/2016   TRIG 106.0 02/13/2016   HDL 48.60 02/13/2016   LDLDIRECT 188.6 07/10/2013   LDLCALC 227 (H) 02/13/2016   ALT 11 11/09/2016   AST 13 11/09/2016   NA 140 11/09/2016   K 3.9 11/09/2016   CL 104 11/09/2016   CREATININE 0.81 11/09/2016   BUN 10 11/09/2016   CO2 30 11/09/2016   TSH 4.11 02/13/2016   HGBA1C 5.7 02/13/2016    Dg Hip Complete Left  Result Date: 08/22/2014 CLINICAL DATA:  Pain radiating from her upper leg to left hip for 2 weeks. No known injury. EXAM: LEFT HIP - COMPLETE 2+ VIEW COMPARISON:  None. FINDINGS: There is no evidence of  acute fracture or dislocation. There is mild bilateral hip joint space narrowing, left greater than right. Small calcifications in the pelvis likely represent phleboliths. IMPRESSION: No acute osseous abnormality identified. Mild bilateral hip osteoarthrosis. Electronically Signed   By: Logan Bores   On: 08/22/2014 16:46    Assessment & Plan:   Otylia was seen today for hypertension, anemia and urinary tract infection.  Diagnoses and all orders for this visit:  Dysuria- her dipstick UA is positive only for some white blood cells, I will await the results of urine culture before prescribing antibiotics. -     POCT Urinalysis Dipstick (Automated) -     CULTURE, URINE COMPREHENSIVE; Future  Essential hypertension, benign- her blood pressures adequately well-controlled, electrolytes and renal function are normal. -     Basic metabolic panel; Future  Kidney disease, chronic, stage III (GFR 30-59 ml/min)- improvement noted in her renal function, she continues to avoid nephrotoxic agents. -  Basic metabolic panel; Future  Other vitamin B12 deficiency anemia- her B12 and folate levels are normal now, she continues to be anemic so we will pursue a GI workup. -     CBC with Differential/Platelet; Future -     Folate; Future -     Vitamin B12; Future  Deficiency anemia- B12 and iron levels are normal now -     CBC with Differential/Platelet; Future -     IBC panel; Future -     Ferritin; Future -     Folate; Future -     Vitamin B12; Future  Blood in stool- I've asked her to see GI to consider having upper and lower endoscopy to screen for GI sources of blood loss. -     Ambulatory referral to Gastroenterology  Generalized abdominal tenderness without rebound tenderness- she has mild tenderness on examination, all of her labs are normal, her plain films are normal, if her pain does not resolve soon then I will consider ordering a CT scan to screen for mass. -     Lipase; Future -      Amylase; Future -     Hepatic function panel; Future -     DG Abd Acute W/Chest; Future -     C-reactive protein; Future   I have discontinued Ms. Maryland's Senna-Psyllium (PERDIEM PO), DULoxetine HCl, and nebivolol. I am also having her maintain her cholecalciferol, Azelastine HCl, potassium chloride SA, triamcinolone cream, atorvastatin, MIMVEY, traMADol, methocarbamol, ALPRAZolam, and albuterol.  No orders of the defined types were placed in this encounter.    Follow-up: Return in about 4 weeks (around 12/07/2016).  Scarlette Calico, MD

## 2016-11-11 ENCOUNTER — Encounter: Payer: Self-pay | Admitting: Internal Medicine

## 2016-11-11 ENCOUNTER — Ambulatory Visit: Payer: BC Managed Care – PPO

## 2016-11-11 LAB — CULTURE, URINE COMPREHENSIVE: Organism ID, Bacteria: NO GROWTH

## 2016-12-14 HISTORY — PX: COLONOSCOPY: SHX174

## 2016-12-15 DIAGNOSIS — N390 Urinary tract infection, site not specified: Secondary | ICD-10-CM | POA: Insufficient documentation

## 2016-12-21 ENCOUNTER — Encounter: Payer: Self-pay | Admitting: Internal Medicine

## 2017-01-13 ENCOUNTER — Other Ambulatory Visit: Payer: Self-pay | Admitting: Internal Medicine

## 2017-01-18 ENCOUNTER — Encounter: Payer: Self-pay | Admitting: Internal Medicine

## 2017-01-18 ENCOUNTER — Encounter: Payer: Self-pay | Admitting: Gastroenterology

## 2017-02-23 ENCOUNTER — Ambulatory Visit: Payer: BC Managed Care – PPO | Admitting: Gastroenterology

## 2017-03-12 ENCOUNTER — Ambulatory Visit: Payer: BC Managed Care – PPO

## 2017-03-13 ENCOUNTER — Ambulatory Visit (INDEPENDENT_AMBULATORY_CARE_PROVIDER_SITE_OTHER): Payer: BC Managed Care – PPO | Admitting: Physician Assistant

## 2017-03-13 VITALS — BP 152/79 | HR 67 | Temp 98.8°F | Resp 18 | Ht 64.25 in | Wt 191.5 lb

## 2017-03-13 DIAGNOSIS — M4726 Other spondylosis with radiculopathy, lumbar region: Secondary | ICD-10-CM | POA: Diagnosis not present

## 2017-03-13 MED ORDER — METHYLPREDNISOLONE ACETATE 80 MG/ML IJ SUSP: 80.0000 mg | Freq: Once | INTRAMUSCULAR | Status: DC

## 2017-03-13 NOTE — Progress Notes (Signed)
03/13/2017 12:38 PM   DOB: [birth year] / MRN: [id number]  SUBJECTIVE:  Paula Meyer is a 66 y.o. female presenting for right sided low back pain that started about 2 weeks ago and is worsening. She has tried tramadol and muscle relaxer. The tramadol helps but is not helping her "function adequately."  She denies a history of diabetes.  She has a history of "severe radiographic DJD with anterolisthesis about the lumbar spine.   She had a similar problem with the left side in 2015. She had some problems with her creatinine in the past and this was thought to be 2/2 NSAIDS.   She is allergic to paxil [paroxetine hcl]; bupropion hcl; contrast media [iodinated diagnostic agents]; and tetracycline.   She  has a past medical history of Allergy; Anxiety; Asthma; Depression; colonic polyps; Hyperlipidemia; Hypertension; and Osteoarthritis.    She  reports that she has never smoked. She has never used smokeless tobacco. She reports that she drinks about 1.8 oz of alcohol per week . She reports that she does not use drugs. She  reports that she does not currently engage in sexual activity. The patient  has a past surgical history that includes Tubal ligation.  Her family history includes Arthritis in her other; Depression in her other; Hyperlipidemia in her brother; Hypertension in her brother.  Review of Systems  Cardiovascular: Negative for chest pain.  Gastrointestinal: Negative for heartburn.  Musculoskeletal: Positive for back pain and myalgias.  Neurological: Negative for dizziness.    The problem list and medications were reviewed and updated by myself where necessary and exist elsewhere in the encounter.   OBJECTIVE:  BP (!) 152/79   Pulse 67   Temp 98.8 F (37.1 C) (Oral)   Resp 18   Ht 5' 4.25" (1.632 m)   Wt 191 lb 8 oz (86.9 kg)   LMP 04/02/2011   SpO2 98%   BMI 32.62 kg/m   Physical Exam  Cardiovascular: Normal rate, regular rhythm, S1 normal and S2 normal.  Exam reveals no  decreased pulses.   Musculoskeletal: She exhibits no edema.       Lumbar back: She exhibits tenderness and pain. She exhibits normal range of motion, no bony tenderness, no swelling, no edema and no deformity.  Negative SLR.     Lab Results  Component Value Date   CREATININE 0.81 11/09/2016   BUN 10 11/09/2016   NA 140 11/09/2016   K 3.9 11/09/2016   CL 104 11/09/2016   CO2 30 11/09/2016   Lab Results  Component Value Date   ALT 11 11/09/2016   AST 13 11/09/2016   ALKPHOS 70 11/09/2016   BILITOT 0.5 11/09/2016   Lab Results  Component Value Date   HGBA1C 5.7 02/13/2016   BP Readings from Last 3 Encounters:  03/13/17 (!) 152/79  11/09/16 140/80  09/02/16 130/80     No results found for this or any previous visit (from the past 72 hour(s)).  No results found.  ASSESSMENT AND PLAN:  [name] was seen today for back pain and depression.  Diagnoses and all orders for this visit:  Osteoarthritis of spine with radiculopathy, lumbar region: She will continue tramadol and muscle relaxer at night.  She will not mix these.  Back in three weeks if not improved. BP appears to be secondary to pain today as her previous measures are more reasonable.  -     methylPREDNISolone acetate (DEPO-MEDROL) injection 80 mg; Inject 1 mL (80 mg total) into the  muscle once.    The patient is advised to call or return to clinic if she does not see an improvement in symptoms, or to seek the care of the closest emergency department if she worsens with the above plan.   Philis Fendt, MHS, PA-C [facility] 03/13/2017 12:38 PM

## 2017-03-13 NOTE — Patient Instructions (Addendum)
Take tramadol in the morning and muscle relaxer at night.  Do not mix these together.  I hope the steroid shot helps.  If not, come back in so we can consider our next option.     IF you received an x-ray today, you will receive an invoice from [facility]. Please contact [facility] at [phone] with questions or concerns regarding your invoice.   IF you received labwork today, you will receive an invoice from [facility]. Please contact [facility] at [phone] with questions or concerns regarding your invoice.   Our billing staff will not be able to assist you with questions regarding bills from these companies.  You will be contacted with the lab results as soon as they are available. The fastest way to get your results is to activate your My Chart account. Instructions are located on the last page of this paperwork. If you have not heard from [place] regarding the results in 2 weeks, please contact this office.

## 2017-03-30 ENCOUNTER — Encounter: Payer: Self-pay | Admitting: Gastroenterology

## 2017-05-11 ENCOUNTER — Other Ambulatory Visit: Payer: Self-pay | Admitting: Family

## 2017-06-18 LAB — HM PAP SMEAR

## 2017-09-17 ENCOUNTER — Ambulatory Visit (INDEPENDENT_AMBULATORY_CARE_PROVIDER_SITE_OTHER): Payer: BC Managed Care – PPO | Admitting: Physician Assistant

## 2017-09-17 ENCOUNTER — Encounter: Payer: Self-pay | Admitting: Physician Assistant

## 2017-09-17 VITALS — BP 138/78 | HR 73 | Temp 98.7°F | Resp 16 | Ht 64.25 in | Wt 193.0 lb

## 2017-09-17 DIAGNOSIS — R1900 Intra-abdominal and pelvic swelling, mass and lump, unspecified site: Secondary | ICD-10-CM

## 2017-09-17 LAB — POC MICROSCOPIC URINALYSIS (UMFC): MUCUS RE: ABSENT

## 2017-09-17 LAB — POCT URINALYSIS DIP (MANUAL ENTRY)
Bilirubin, UA: NEGATIVE
Glucose, UA: NEGATIVE mg/dL
Ketones, POC UA: NEGATIVE mg/dL
NITRITE UA: NEGATIVE
PH UA: 6.5 (ref 5.0–8.0)
PROTEIN UA: NEGATIVE mg/dL
Spec Grav, UA: 1.02 (ref 1.010–1.025)
UROBILINOGEN UA: 0.2 U/dL

## 2017-09-17 MED ORDER — CEPHALEXIN 750 MG PO CAPS: 750.0000 mg | ORAL_CAPSULE | Freq: Four times a day (QID) | ORAL | 0 refills | Status: DC

## 2017-09-17 NOTE — Patient Instructions (Addendum)
For constipation   Make sure you are drinking enough water daily. Make sure you are getting enough fiber in your diet - this will make you regular - you can eat high fiber foods or use metamucil as a supplement - it is really important to drink enough water when using fiber supplements.  If your stools are hard or are formed balls or you have to strain a stool softener will help - use colace 2-3 capsule a day  For gentle treatment of constipation Use Miralax 1-2 capfuls a day until your stools are soft and regular and then decrease the usage - you can use this daily  For more aggressive treatment of constipation Use 4 capfuls of Colace and 6 doses of Miralax and drink it in 2 hours - this should result in several watery stools - if it does not repeat the next day and then go to daily miralax for a week to make sure your bowels are clean and retrained to work properly  For the most aggressive treatment of constipation Use 14 capfuls of Miralax in 1 gallon of fluid (gatoraid or water work well or a combination of the two) and drink over 12h - it is ok to eat during this time and then use Miralax 1 capful daily for about 2 weeks to prevent the constipation from returning  For back pain Tylenol 1000 every 8 hours as needed for pain relief. If that is not working then continue the tylenol and add Aleve or    IF you received an x-ray today, you will receive an invoice from [facility]. Please contact [facility] at [phone] with questions or concerns regarding your invoice.   IF you received labwork today, you will receive an invoice from [facility]. Please contact [facility] at [phone] with questions or concerns regarding your invoice.   Our billing staff will not be able to assist you with questions regarding bills from these companies.  You will be contacted with the lab results as soon as they are available. The fastest way to get your results is to activate your My  Chart account. Instructions are located on the last page of this paperwork. If you have not heard from [place] regarding the results in 2 weeks, please contact this office.

## 2017-09-17 NOTE — Progress Notes (Signed)
09/18/2017 9:43 AM   DOB: [birth year] / MRN: [id number]  SUBJECTIVE:  Paula Meyer is a 66 y.o. female presenting for dysuria. This started a few days ago and into last week.  She does feel the bladder pain is getting worse.  Plans to travel next week and wants to be sure that this is not an infection that is going untreated.  Has tried drinking more water. Last bowel movement was yesterday.  She needs to take laxatives in order to have a BM.  She denies any vaginal bleeding today. Tells me that she had a right sided oophorectomy w   Takes tramadol, xanax and potassium.    She is allergic to paxil [paroxetine hcl]; bupropion hcl; contrast media [iodinated diagnostic agents]; and tetracycline.   She  has a past medical history of Allergy; Anxiety; Asthma; Depression; colonic polyps; Hyperlipidemia; Hypertension; and Osteoarthritis.    She  reports that she has never smoked. She has never used smokeless tobacco. She reports that she drinks about 1.8 oz of alcohol per week . She reports that she does not use drugs. She  reports that she does not currently engage in sexual activity. The patient  has a past surgical history that includes Tubal ligation.  Her family history includes Arthritis in her other; Depression in her other; Hyperlipidemia in her brother; Hypertension in her brother.  Review of Systems  Constitutional: Negative for chills, diaphoresis and fever.  Respiratory: Negative for cough.   Cardiovascular: Negative for chest pain.  Gastrointestinal: Negative for blood in stool, constipation and nausea.  Genitourinary: Negative for dysuria, flank pain, frequency, hematuria and urgency.  Skin: Negative for itching and rash.  Neurological: Negative for dizziness.    The problem list and medications were reviewed and updated by myself where necessary and exist elsewhere in the encounter.   OBJECTIVE:  BP 138/78 (BP Location: Right Arm, Patient Position: Sitting, Cuff Size:  Normal)   Pulse 73   Temp 98.7 F (37.1 C) (Oral)   Resp 16   Ht 5' 4.25" (1.632 m)   Wt 193 lb (87.5 kg)   LMP 04/02/2011   SpO2 99%   BMI 32.87 kg/m   Wt Readings from Last 3 Encounters:  09/17/17 193 lb (87.5 kg)  03/13/17 191 lb 8 oz (86.9 kg)  11/09/16 187 lb 4 oz (84.9 kg)   Lab Results  Component Value Date   CHOL 297 (H) 02/13/2016   HDL 48.60 02/13/2016   LDLCALC 227 (H) 02/13/2016   LDLDIRECT 188.6 07/10/2013   TRIG 106.0 02/13/2016   CHOLHDL 6 02/13/2016   Lab Results  Component Value Date   CREATININE 0.81 11/09/2016   BUN 10 11/09/2016   NA 140 11/09/2016   K 3.9 11/09/2016   CL 104 11/09/2016   CO2 30 11/09/2016       Physical Exam  Constitutional: She is active.  Non-toxic appearance.  Cardiovascular: Normal rate, regular rhythm and normal heart sounds.  Exam reveals no gallop and no friction rub.   No murmur heard. Pulmonary/Chest: Effort normal and breath sounds normal. No tachypnea.  Abdominal: Soft. Bowel sounds are normal. She exhibits no distension and no mass. There is no tenderness. There is no rebound and no guarding.  Neurological: She is alert. She displays normal reflexes. No cranial nerve deficit. She exhibits normal muscle tone. Coordination normal.  Skin: Skin is warm and dry. She is not diaphoretic. No pallor.  Psychiatric: She has a normal mood and affect.  Results for orders placed or performed in visit on 09/17/17 (from the past 72 hour(s))  POCT urinalysis dipstick     Status: Abnormal   Collection Time: 09/17/17  4:48 PM  Result Value Ref Range   Color, UA yellow yellow   Clarity, UA cloudy (A) clear   Glucose, UA negative negative mg/dL   Bilirubin, UA negative negative   Ketones, POC UA negative negative mg/dL   Spec Grav, UA 1.020 1.010 - 1.025   Blood, UA trace-intact (A) negative   pH, UA 6.5 5.0 - 8.0   Protein Ur, POC negative negative mg/dL   Urobilinogen, UA 0.2 0.2 or 1.0 E.U./dL   Nitrite, UA Negative  Negative   Leukocytes, UA Trace (A) Negative  POCT Microscopic Urinalysis ([facility])     Status: Abnormal   Collection Time: 09/17/17  5:01 PM  Result Value Ref Range   WBC,UR,HPF,POC Few (A) None WBC/hpf   RBC,UR,HPF,POC None None RBC/hpf   Bacteria Few (A) None, Too numerous to count   Mucus Absent Absent   Epithelial Cells, UR Per Microscopy Many (A) None, Too numerous to count cells/hpf    No results found.  ASSESSMENT AND PLAN:  [name] was seen today for dysuria.  Diagnoses and all orders for this visit:  Pelvic fullness: Some leuks on dip.  Will go ahead and treat for a UTI as she has a history of this.  She denies vaginal discharge. She has GYN follow up and PCP follow up in the next two weeks.  Will follow urine culture.  -     POCT urinalysis dipstick -     Urine Culture -     POCT Microscopic Urinalysis ([facility])    The patient is advised to call or return to clinic if she does not see an improvement in symptoms, or to seek the care of the closest emergency department if she worsens with the above plan.   Philis Fendt, MHS, PA-C [facility] 09/18/2017 9:43 AM

## 2017-09-19 LAB — URINE CULTURE

## 2017-09-23 ENCOUNTER — Ambulatory Visit: Payer: Self-pay | Admitting: Family

## 2017-09-30 ENCOUNTER — Ambulatory Visit (INDEPENDENT_AMBULATORY_CARE_PROVIDER_SITE_OTHER): Payer: BC Managed Care – PPO | Admitting: Nurse Practitioner

## 2017-09-30 ENCOUNTER — Encounter: Payer: Self-pay | Admitting: Gastroenterology

## 2017-09-30 ENCOUNTER — Encounter: Payer: Self-pay | Admitting: Nurse Practitioner

## 2017-09-30 VITALS — BP 128/72 | HR 64 | Ht 65.0 in | Wt 193.0 lb

## 2017-09-30 DIAGNOSIS — Z1211 Encounter for screening for malignant neoplasm of colon: Secondary | ICD-10-CM

## 2017-09-30 DIAGNOSIS — R196 Halitosis: Secondary | ICD-10-CM | POA: Diagnosis not present

## 2017-09-30 MED ORDER — NA SULFATE-K SULFATE-MG SULF 17.5-3.13-1.6 GM/177ML PO SOLN: ORAL | 0 refills | Status: DC

## 2017-09-30 NOTE — Progress Notes (Signed)
Reviewed and agree with initial management plan.  Avir Deruiter T. Almeter Westhoff, MD FACG 

## 2017-09-30 NOTE — Progress Notes (Signed)
HPI:  [name]. She has a history constipation, diverticulosis. She had a colonoscopy in 2008, she was due for another colon cancer screening in April of this year and we sent her a letter regarding this. In November she had an episode of bleeding with a bowel movement but no bleeding since then. Recently started Keto diet and bowels now a little "sluggish".    Paula Meyer is concerned about that she has halitosis. She planes are dry mouth. She also has very bad sinus problems around this time of year and thinks that could be a contributing factor. No GERD sx.   Over the last year patient has been having problems eating beef. Whether it is steak, broiled beef or hamburger meat patient feels "full" for the following 24 hours. Feels like beef just sits in her stomach. No dysphagia and never has this problem with anything else she eats. Initially mentioned that pizza caused the same sx but then realizes that she always has beef on her pizza and has no problems with bread, cheese or tomato sauce. The "full"sensation that ensues after eating beef is not associated with nausea or increased belching.   Past Medical History:  Diagnosis Date  . Allergy   . Anxiety   . Asthma   . Depression   . Hx of colonic polyps   . Hyperlipidemia   . Hypertension   . Osteoarthritis     Past Surgical History:  Procedure Laterality Date  . TUBAL LIGATION     Family History  Problem Relation Age of Onset  . Hyperlipidemia Brother   . Hypertension Brother   . Arthritis Other   . Depression Other   . Colon cancer Neg Hx   . Stomach cancer Neg Hx    Social History  Substance Use Topics  . Smoking status: Former Research scientist (life sciences)  . Smokeless tobacco: Never Used  . Alcohol use 1.8 oz/week    3 Glasses of wine per week     Comment: rarely   Current Outpatient Prescriptions  Medication Sig Dispense Refill  . albuterol (PROVENTIL HFA;VENTOLIN HFA) 108 (90 Base) MCG/ACT  inhaler Inhale 2 puffs into the lungs every 4 (four) hours as needed for wheezing or shortness of breath (cough, shortness of breath or wheezing.). 1 Inhaler 1  . ALPRAZolam (XANAX) 0.25 MG tablet TAKE 1 TABLET BY MOUTH AT BEDTIME AS NEEDED FOR ANXIETY 30 tablet 5  . Azelastine HCl 0.15 % SOLN USE 1 SPRAY IN THE NOSE TWICE DAILY 30 mL 11  . cholecalciferol (VITAMIN D) 1000 UNITS tablet Take 1,000 Units by mouth daily. Reported on 07/02/2016    . methocarbamol (ROBAXIN) 500 MG tablet Take 1 tablet (500 mg total) by mouth every 8 (eight) hours as needed for muscle spasms. 75 tablet 3  . MIMVEY 1-0.5 MG tablet Take 1 tablet by mouth daily. prn  3  . potassium chloride SA (K-DUR,KLOR-CON) 20 MEQ tablet Take 0.5 tablets (10 mEq total) by mouth daily. As needed 90 tablet 3  . traMADol (ULTRAM) 50 MG tablet Take 1 tablet (50 mg total) by mouth every 8 (eight) hours as needed. 75 tablet 3   No current facility-administered medications for this visit.    Allergies  Allergen Reactions  . Paxil [Paroxetine Hcl]     Makes her grind her teeth  . Bupropion Hcl Other (See Comments)    palpitations  . Contrast Media [Iodinated Diagnostic Agents] Hives and Itching  .  Tetracycline Nausea Only     Review of Systems: Ostomy for allergy, sinus trouble, anxiety, arthritis, back pain, blood in urine, depression, fatigue, hearing problems and cramps. All other systems reviewed and negative except where noted in HPI.    Physical Exam: BP 128/72   Pulse 64   Ht 5\' 5"  (1.651 m)   Wt 193 lb (87.5 kg)   LMP 04/02/2011   BMI 32.12 kg/m  Constitutional:  Well-developed, black female in no acute distress. Psychiatric: Pleasant, normal mood and affect. Behavior is normal. EENT: Pupils normal.  Conjunctivae are normal. No scleral icterus. Neck supple.  Cardiovascular: Normal rate, regular rhythm. No edema Pulmonary/chest: Effort normal and breath sounds normal. No wheezing, rales or rhonchi. Abdominal: Soft,  nondistended. Nontender. Bowel sounds active throughout. There are no masses palpable. No hepatomegaly. Lymphadenopathy: No cervical adenopathy noted. Neurological: Alert and oriented to person place and time. Skin: Skin is warm and dry. No rashes noted.   ASSESSMENT AND PLAN:  17. 66 year old female for colon cancer screening. Last colonoscopy in 2008. One episode of painless rectal bleeding with BM in November which she thinks was hemorrhoidal. Mildly anemic in November, hgb 11.9 with normal ferritin.  -Patient will be scheduled for a colonoscopy with possible polypectomy.  The risks and benefits of the procedure were discussed and the patient agrees to proceed.   2. Perceived halitosis. She complains of sinus congestion, typical for her this time of year. She also complains of a dry mouth. Both of these things could be contributing. No GERD symptoms but still to be considered as potential cause -Recommended biotene to increase moisture in mouth -she will call PCP about a decongestant to help with sinus congestion.  -if no improvement then we may try short course of an acid-blocker.    3. Intolerance to beef over the last year. Consumption leads to feeling of fullness for 24 hours. Doesn't lead to vomiting and no pain and only happens with beef. She does not describe dysphagia to beef. Etiology unclear but not overly concerning at this point.    [name], NP  09/30/2017, 9:09 AM   [name], MD

## 2017-09-30 NOTE — Patient Instructions (Signed)
If you are age 64 or older, your body mass index should be between 23-30. Your Body mass index is 32.12 kg/m. If this is out of the aforementioned range listed, please consider follow up with your Primary Care Provider.  If you are age 9 or younger, your body mass index should be between 19-25. Your Body mass index is 32.12 kg/m. If this is out of the aformentioned range listed, please consider follow up with your Primary Care Provider.   You have been scheduled for a colonoscopy. Please follow written instructions given to you at your visit today.  Please pick up your prep supplies at the pharmacy within the next 1-3 days. If you use inhalers (even only as needed), please bring them with you on the day of your procedure. Your physician has requested that you go to www.startemmi.com and enter the access code given to you at your visit today. This web site gives a general overview about your procedure. However, you should still follow specific instructions given to you by our office regarding your preparation for the procedure.  Purchase Insoluble fiber (over-the-counter) use daily.  Thank you for choosing me and [facility].   [name], NP

## 2017-10-06 LAB — HM MAMMOGRAPHY

## 2017-10-14 ENCOUNTER — Encounter: Payer: Self-pay | Admitting: Gastroenterology

## 2017-10-14 ENCOUNTER — Ambulatory Visit (AMBULATORY_SURGERY_CENTER): Payer: BC Managed Care – PPO | Admitting: Gastroenterology

## 2017-10-14 VITALS — BP 111/73 | HR 65 | Temp 97.3°F | Resp 14 | Ht 65.0 in | Wt 193.0 lb

## 2017-10-14 DIAGNOSIS — Z1211 Encounter for screening for malignant neoplasm of colon: Secondary | ICD-10-CM

## 2017-10-14 DIAGNOSIS — D122 Benign neoplasm of ascending colon: Secondary | ICD-10-CM

## 2017-10-14 DIAGNOSIS — D124 Benign neoplasm of descending colon: Secondary | ICD-10-CM

## 2017-10-14 DIAGNOSIS — Z1212 Encounter for screening for malignant neoplasm of rectum: Secondary | ICD-10-CM | POA: Diagnosis not present

## 2017-10-14 LAB — HM COLONOSCOPY

## 2017-10-14 MED ORDER — SODIUM CHLORIDE 0.9 % IV SOLN: 500.0000 mL | INTRAVENOUS | Status: DC

## 2017-10-14 NOTE — Patient Instructions (Signed)
Impression/Recommendations:  Polyp handout given to patient.  Repeat colonoscopy in 5-10 years based on pathology report.  Resume previous diet. Continue present medications.  YOU HAD AN ENDOSCOPIC PROCEDURE TODAY AT [facility]:   Refer to the procedure report that was given to you for any specific questions about what was found during the examination.  If the procedure report does not answer your questions, please call your gastroenterologist to clarify.  If you requested that your care partner not be given the details of your procedure findings, then the procedure report has been included in a sealed envelope for you to review at your convenience later.  YOU SHOULD EXPECT: Some feelings of bloating in the abdomen. Passage of more gas than usual.  Walking can help get rid of the air that was put into your GI tract during the procedure and reduce the bloating. If you had a lower endoscopy (such as a colonoscopy or flexible sigmoidoscopy) you may notice spotting of blood in your stool or on the toilet paper. If you underwent a bowel prep for your procedure, you may not have a normal bowel movement for a few days.  Please Note:  You might notice some irritation and congestion in your nose or some drainage.  This is from the oxygen used during your procedure.  There is no need for concern and it should clear up in a day or so.  SYMPTOMS TO REPORT IMMEDIATELY:   Following lower endoscopy (colonoscopy or flexible sigmoidoscopy):  Excessive amounts of blood in the stool  Significant tenderness or worsening of abdominal pains  Swelling of the abdomen that is new, acute  Fever of 100F or higher  For urgent or emergent issues, a gastroenterologist can be reached at any hour by calling [phone].   DIET:  We do recommend a small meal at first, but then you may proceed to your regular diet.  Drink plenty of fluids but you should avoid alcoholic beverages for 24  hours.  ACTIVITY:  You should plan to take it easy for the rest of today and you should NOT DRIVE or use heavy machinery until tomorrow (because of the sedation medicines used during the test).    FOLLOW UP: Our staff will call the number listed on your records the next business day following your procedure to check on you and address any questions or concerns that you may have regarding the information given to you following your procedure. If we do not reach you, we will leave a message.  However, if you are feeling well and you are not experiencing any problems, there is no need to return our call.  We will assume that you have returned to your regular daily activities without incident.  If any biopsies were taken you will be contacted by phone or by letter within the next 1-3 weeks.  Please call us at [phone] if you have not heard about the biopsies in 3 weeks.    SIGNATURES/CONFIDENTIALITY: You and/or your care partner have signed paperwork which will be entered into your electronic medical record.  These signatures attest to the fact that that the information above on your After Visit Summary has been reviewed and is understood.  Full responsibility of the confidentiality of this discharge information lies with you and/or your care-partner.

## 2017-10-14 NOTE — Progress Notes (Signed)
Called to room to assist during endoscopic procedure.  Patient ID and intended procedure confirmed with present staff. Received instructions for my participation in the procedure from the performing physician.  

## 2017-10-14 NOTE — Progress Notes (Signed)
Report given to PACU, vss 

## 2017-10-14 NOTE — Op Note (Signed)
[facility] Patient Name: Paula Meyer Procedure Date: 10/14/2017 9:50 AM MRN: [id number] Endoscopist: Ladene Artist , MD Age: 66 Referring MD:  Date of Birth: 1951-11-20 Gender: Female Account #: [id number] Procedure:                Colonoscopy Indications:              Screening for colorectal malignant neoplasm Medicines:                Monitored Anesthesia Care Procedure:                Pre-Anesthesia Assessment:                           - Prior to the procedure, a History and Physical                            was performed, and patient medications and                            allergies were reviewed. The patient's tolerance of                            previous anesthesia was also reviewed. The risks                            and benefits of the procedure and the sedation                            options and risks were discussed with the patient.                            All questions were answered, and informed consent                            was obtained. Prior Anticoagulants: The patient has                            taken no previous anticoagulant or antiplatelet                            agents. ASA Grade Assessment: II - A patient with                            mild systemic disease. After reviewing the risks                            and benefits, the patient was deemed in                            satisfactory condition to undergo the procedure.                           After obtaining informed consent, the colonoscope  was passed under direct vision. Throughout the                            procedure, the patient's blood pressure, pulse, and                            oxygen saturations were monitored continuously. The                            Colonoscope was introduced through the anus and                            advanced to the the cecum, identified by                            appendiceal orifice and  ileocecal valve. The                            ileocecal valve, appendiceal orifice, and rectum                            were photographed. The quality of the bowel                            preparation was good. The colonoscopy was performed                            without difficulty. The patient tolerated the                            procedure well. Scope In: 9:54:46 AM Scope Out: 10:09:20 AM Scope Withdrawal Time: 0 hours 12 minutes 17 seconds  Total Procedure Duration: 0 hours 14 minutes 34 seconds  Findings:                 The perianal and digital rectal examinations were                            normal.                           Two sessile polyps were found in the descending                            colon and ascending colon. The polyps were 5 to 6                            mm in size. These polyps were removed with a cold                            biopsy forceps. Resection and retrieval were                            complete.  A diffuse area of severe melanosis was found in the                            entire colon.                           The exam was otherwise without abnormality on                            direct and retroflexion views. Complications:            No immediate complications. Estimated blood loss:                            None. Estimated Blood Loss:     Estimated blood loss: none. Impression:               - Two 5 to 6 mm polyps in the descending colon and                            in the ascending colon, removed with a cold biopsy                            forceps. Resected and retrieved.                           - Melanosis in the colon.                           - The examination was otherwise normal on direct                            and retroflexion views. Recommendation:           - Repeat colonoscopy in 5 years for surveillance if                            polyp(s) are precancerous, otherwise 10  years.                           - Patient has a contact number available for                            emergencies. The signs and symptoms of potential                            delayed complications were discussed with the                            patient. Return to normal activities tomorrow.                            Written discharge instructions were provided to the                            patient.                           -  Resume previous diet.                           - Continue present medications.                           - Await pathology results. Ladene Artist, MD 10/14/2017 10:13:57 AM This report has been signed electronically.

## 2017-10-15 ENCOUNTER — Telehealth: Payer: Self-pay | Admitting: *Deleted

## 2017-10-15 NOTE — Telephone Encounter (Signed)
No answer, left message to call if questions or concerns. 

## 2017-10-15 NOTE — Telephone Encounter (Signed)
lbgi No answer, left message to call if questions or concerns.

## 2017-10-15 NOTE — Telephone Encounter (Signed)
No answer second call.  Left message to call if questions or concerns. 

## 2017-10-21 ENCOUNTER — Telehealth: Payer: Self-pay

## 2017-10-21 NOTE — Telephone Encounter (Signed)
The pt wanted to know what melanosis is, I explained it is usually a discoloring from laxative use.  She had no further questions.

## 2017-10-22 ENCOUNTER — Encounter: Payer: Self-pay | Admitting: Gastroenterology

## 2017-10-26 LAB — HM COLONOSCOPY

## 2017-11-03 ENCOUNTER — Encounter: Payer: Self-pay | Admitting: Internal Medicine

## 2017-11-03 ENCOUNTER — Ambulatory Visit: Payer: BC Managed Care – PPO | Admitting: Internal Medicine

## 2017-11-03 DIAGNOSIS — M150 Primary generalized (osteo)arthritis: Secondary | ICD-10-CM

## 2017-11-03 DIAGNOSIS — F411 Generalized anxiety disorder: Secondary | ICD-10-CM | POA: Insufficient documentation

## 2017-11-03 DIAGNOSIS — F338 Other recurrent depressive disorders: Secondary | ICD-10-CM

## 2017-11-03 DIAGNOSIS — M5136 Other intervertebral disc degeneration, lumbar region: Secondary | ICD-10-CM

## 2017-11-03 DIAGNOSIS — F419 Anxiety disorder, unspecified: Secondary | ICD-10-CM | POA: Diagnosis not present

## 2017-11-03 DIAGNOSIS — M159 Polyosteoarthritis, unspecified: Secondary | ICD-10-CM

## 2017-11-03 MED ORDER — TRAMADOL HCL 50 MG PO TABS: 50.0000 mg | ORAL_TABLET | Freq: Two times a day (BID) | ORAL | 0 refills | Status: DC | PRN

## 2017-11-03 MED ORDER — BUSPIRONE HCL 5 MG PO TABS: 5.0000 mg | ORAL_TABLET | Freq: Two times a day (BID) | ORAL | 0 refills | Status: DC

## 2017-11-03 MED ORDER — DULOXETINE HCL 30 MG PO CPEP: 30.0000 mg | ORAL_CAPSULE | Freq: Every day | ORAL | 2 refills | Status: DC

## 2017-11-03 NOTE — Assessment & Plan Note (Signed)
She has been using tylenol successfully most of the time. Rx for small amount of tramadol and educated to use tylenol first then 1/2 tramadol if needed. Follow up with PCP for management.

## 2017-11-03 NOTE — Progress Notes (Signed)
   Subjective:    Patient ID: Paula Meyer, female    DOB: November 09, 1951, 66 y.o.   MRN: [id number]  HPI The patient is a 66 YO female coming in for several concerns including depression (previously took cymbalta, happens to her in the winter months most years, was taking 40 mg cymbalta previously, this worked well for her without side effects, sleeping problems, no motivation to do anything, denies feeling normal pleasure or drive to do activities), anxiety (going on more with stress at work, used to have xanax but ran out of this, is looking for a refill, denies SI/HI, having problems with panic at work, denies panic attacks), and arthritis (a lot of her joints hurt and worse this time of year, previously was taking tramadol regularly but ran out so started using tylenol instead, this is helping some, denies injury or overuse, has not seen orthopedics or sports medicine about them).  Review of Systems  Constitutional: Positive for activity change and appetite change.  HENT: Negative.   Eyes: Negative.   Respiratory: Negative for cough, chest tightness and shortness of breath.   Cardiovascular: Negative for chest pain, palpitations and leg swelling.  Gastrointestinal: Negative for abdominal distention, abdominal pain, constipation, diarrhea, nausea and vomiting.  Musculoskeletal: Positive for arthralgias. Negative for gait problem, joint swelling and myalgias.  Skin: Negative.   Neurological: Negative.   Psychiatric/Behavioral: Positive for decreased concentration, dysphoric mood and sleep disturbance. Negative for self-injury and suicidal ideas. The patient is nervous/anxious.       Objective:   Physical Exam  Constitutional: She is oriented to person, place, and time. She appears well-developed and well-nourished.  HENT:  Head: Normocephalic and atraumatic.  Eyes: EOM are normal.  Neck: Normal range of motion.  Cardiovascular: Normal rate and regular rhythm.  Pulmonary/Chest: Effort  normal and breath sounds normal. No respiratory distress. She has no wheezes. She has no rales.  Abdominal: Soft. Bowel sounds are normal. She exhibits no distension. There is no tenderness. There is no rebound.  Musculoskeletal: She exhibits tenderness. She exhibits no edema.  diffuse  Neurological: She is alert and oriented to person, place, and time. Coordination normal.  Skin: Skin is warm and dry.  Psychiatric:  Flat affect   Vitals:   11/03/17 1108  BP: 140/88  Pulse: 74  Temp: 98.7 F (37.1 C)  TempSrc: Oral  SpO2: 99%  Weight: 196 lb (88.9 kg)  Height: 5\' 5"  (1.651 m)      Assessment & Plan:

## 2017-11-03 NOTE — Assessment & Plan Note (Signed)
Counseled about the concerns with xanax usage concurrent with tramadol as well as safety concerns with falls and memory problems as well as dependence and addiction. She elects to try buspar instead due to better safety.

## 2017-11-03 NOTE — Patient Instructions (Signed)
We have sent in the cymbalta to take 1 pill daily. After 2 weeks if you are feeling bad still go up to 2 pills daily. Send [place] a message to let us know how you are doing after about 4-5 weeks.   We have sent in buspar for anxiety to try if needed.   We have refilled the tramadol for a small amount but you should be taking tylenol mostly as this is much safer for arthritis.   Stress and Stress Management Stress is a normal reaction to life events. It is what you feel when life demands more than you are used to or more than you can handle. Some stress can be useful. For example, the stress reaction can help you catch the last bus of the day, study for a test, or meet a deadline at work. But stress that occurs too often or for too long can cause problems. It can affect your emotional health and interfere with relationships and normal daily activities. Too much stress can weaken your immune system and increase your risk for physical illness. If you already have a medical problem, stress can make it worse. What are the causes? All sorts of life events may cause stress. An event that causes stress for one person may not be stressful for another person. Major life events commonly cause stress. These may be positive or negative. Examples include losing your job, moving into a new home, getting married, having a baby, or losing a loved one. Less obvious life events may also cause stress, especially if they occur day after day or in combination. Examples include working long hours, driving in traffic, caring for children, being in debt, or being in a difficult relationship. What are the signs or symptoms? Stress may cause emotional symptoms including, the following:  Anxiety. This is feeling worried, afraid, on edge, overwhelmed, or out of control.  Anger. This is feeling irritated or impatient.  Depression. This is feeling sad, down, helpless, or guilty.  Difficulty focusing, remembering, or making  decisions.  Stress may cause physical symptoms, including the following:  Aches and pains. These may affect your head, neck, back, stomach, or other areas of your body.  Tight muscles or clenched jaw.  Low energy or trouble sleeping.  Stress may cause unhealthy behaviors, including the following:  Eating to feel better (overeating) or skipping meals.  Sleeping too little, too much, or both.  Working too much or putting off tasks (procrastination).  Smoking, drinking alcohol, or using drugs to feel better.  How is this diagnosed? Stress is diagnosed through an assessment by your health care provider. Your health care provider will ask questions about your symptoms and any stressful life events.Your health care provider will also ask about your medical history and may order blood tests or other tests. Certain medical conditions and medicine can cause physical symptoms similar to stress. Mental illness can cause emotional symptoms and unhealthy behaviors similar to stress. Your health care provider may refer you to a mental health professional for further evaluation. How is this treated? Stress management is the recommended treatment for stress.The goals of stress management are reducing stressful life events and coping with stress in healthy ways. Techniques for reducing stressful life events include the following:  Stress identification. Self-monitor for stress and identify what causes stress for you. These skills may help you to avoid some stressful events.  Time management. Set your priorities, keep a calendar of events, and learn to say "no." These tools can  help you avoid making too many commitments.  Techniques for coping with stress include the following:  Rethinking the problem. Try to think realistically about stressful events rather than ignoring them or overreacting. Try to find the positives in a stressful situation rather than focusing on the negatives.  Exercise.  Physical exercise can release both physical and emotional tension. The key is to find a form of exercise you enjoy and do it regularly.  Relaxation techniques. These relax the body and mind. Examples include yoga, meditation, tai chi, biofeedback, deep breathing, progressive muscle relaxation, listening to music, being out in nature, journaling, and other hobbies. Again, the key is to find one or more that you enjoy and can do regularly.  Healthy lifestyle. Eat a balanced diet, get plenty of sleep, and do not smoke. Avoid using alcohol or drugs to relax.  Strong support network. Spend time with family, friends, or other people you enjoy being around.Express your feelings and talk things over with someone you trust.  Counseling or talktherapy with a mental health professional may be helpful if you are having difficulty managing stress on your own. Medicine is typically not recommended for the treatment of stress.Talk to your health care provider if you think you need medicine for symptoms of stress. Follow these instructions at home:  Keep all follow-up visits as directed by your health care provider.  Take all medicines as directed by your health care provider. Contact a health care provider if:  Your symptoms get worse or you start having new symptoms.  You feel overwhelmed by your problems and can no longer manage them on your own. Get help right away if:  You feel like hurting yourself or someone else. This information is not intended to replace advice given to you by your health care provider. Make sure you discuss any questions you have with your health care provider. Document Released: 05/26/2001 Document Revised: 05/07/2016 Document Reviewed: 07/25/2013 Elsevier Interactive Patient Education  2017 [employer].

## 2017-11-03 NOTE — Assessment & Plan Note (Signed)
Rx for cymbalta 30 mg daily and can increase to 60 mg daily after 2 weeks if no side effects. Call back around 4 weeks for status and return in visit 1-2 months with pcp. She should take seasonally yearly due to recurrence off therapy.

## 2017-11-09 ENCOUNTER — Telehealth: Payer: Self-pay | Admitting: Internal Medicine

## 2017-11-09 NOTE — Telephone Encounter (Signed)
Copied from [place] [id number]. Topic: Quick Communication - See Telephone Encounter >> Nov 09, 2017  2:55 PM Conception Chancy, NT wrote: CRM for notification. See Telephone encounter for:  11/09/17.   Pharmacy is stating the insurance needs pre authorization for tramdal and the pharmacy told her the office could do that. They need they done before they can refill medicine.

## 2017-11-09 NOTE — Telephone Encounter (Signed)
See message below °

## 2017-11-11 NOTE — Telephone Encounter (Signed)
Key: [id number]

## 2018-01-12 ENCOUNTER — Ambulatory Visit (INDEPENDENT_AMBULATORY_CARE_PROVIDER_SITE_OTHER): Payer: BC Managed Care – PPO

## 2018-01-12 ENCOUNTER — Ambulatory Visit: Payer: BC Managed Care – PPO | Admitting: Physician Assistant

## 2018-01-12 ENCOUNTER — Encounter: Payer: Self-pay | Admitting: Physician Assistant

## 2018-01-12 ENCOUNTER — Ambulatory Visit: Payer: Self-pay

## 2018-01-12 ENCOUNTER — Other Ambulatory Visit: Payer: Self-pay

## 2018-01-12 VITALS — BP 147/92 | HR 78 | Resp 16 | Ht 65.0 in | Wt 203.4 lb

## 2018-01-12 DIAGNOSIS — M25552 Pain in left hip: Secondary | ICD-10-CM

## 2018-01-12 DIAGNOSIS — M25561 Pain in right knee: Secondary | ICD-10-CM

## 2018-01-12 DIAGNOSIS — M25562 Pain in left knee: Secondary | ICD-10-CM | POA: Diagnosis not present

## 2018-01-12 MED ORDER — TRAMADOL HCL 50 MG PO TABS: 50.0000 mg | ORAL_TABLET | Freq: Four times a day (QID) | ORAL | 0 refills | Status: DC | PRN

## 2018-01-12 MED ORDER — PREDNISONE 20 MG PO TABS: 40.0000 mg | ORAL_TABLET | Freq: Every day | ORAL | 0 refills | Status: DC

## 2018-01-12 MED ORDER — DICLOFENAC SODIUM 1 % TD GEL: 4.0000 g | Freq: Four times a day (QID) | TRANSDERMAL | 0 refills | Status: DC

## 2018-01-12 NOTE — Progress Notes (Signed)
01/12/2018 11:35 AM   DOB: [birth year] / MRN: [id number]  SUBJECTIVE:  Paula Meyer is a 67 y.o. female presenting for bilateral knee pain and left hip pain.  She has had problems in these joints before.  Tells me that the pain over the last 2 weeks has been worse.  She has started an exercise program that consists mostly of walking.  She tells me that she is a "busy body "around the house and that she is having difficulty with doing her normal chores due to the pain.  She denies fever.  She is allergic to paxil [paroxetine hcl]; bupropion hcl; contrast media [iodinated diagnostic agents]; and tetracycline.   She  has a past medical history of Allergy, Anxiety, Asthma, Depression, colonic polyps, Hyperlipidemia, Hypertension, and Osteoarthritis.    She  reports that she has quit smoking. she has never used smokeless tobacco. She reports that she drinks about 1.8 oz of alcohol per week. She reports that she does not use drugs. She  reports that she does not currently engage in sexual activity. The patient  has a past surgical history that includes Tubal ligation.  Her family history includes Arthritis in her other; Depression in her other; Hyperlipidemia in her brother; Hypertension in her brother.  Review of Systems  Constitutional: Negative for chills, diaphoresis and fever.  Eyes: Negative.   Respiratory: Negative for cough, hemoptysis, sputum production, shortness of breath and wheezing.   Cardiovascular: Negative for chest pain, orthopnea and leg swelling.  Gastrointestinal: Negative for nausea.  Skin: Negative for rash.  Neurological: Negative for dizziness, sensory change, speech change, focal weakness and headaches.    The problem list and medications were reviewed and updated by myself where necessary and exist elsewhere in the encounter.   OBJECTIVE:  BP (!) 147/92 (BP Location: Left Arm, Patient Position: Sitting, Cuff Size: Large)   Pulse 78   Resp 16   Ht 5\' 5"  (1.651  m)   Wt 203 lb 6.4 oz (92.3 kg)   LMP 04/02/2011   SpO2 98%   BMI 33.85 kg/m    Physical Exam  Constitutional: She is active.  Non-toxic appearance.  Cardiovascular: Normal rate, regular rhythm, S1 normal, S2 normal, normal heart sounds and intact distal pulses. Exam reveals no gallop, no friction rub and no decreased pulses.  No murmur heard. Pulmonary/Chest: Effort normal. No stridor. No tachypnea. No respiratory distress. She has no wheezes. She has no rales.  Abdominal: She exhibits no distension.  Musculoskeletal: She exhibits no edema.  Neurological: She is alert.  Skin: Skin is warm and dry. She is not diaphoretic. No pallor.    No results found for this or any previous visit (from the past 72 hour(s)).  Dg Knee Complete 4 Views Left  Result Date: 01/12/2018 CLINICAL DATA:  Pain in both knees, unspecified chronicity. EXAM: LEFT KNEE - COMPLETE 4+ VIEW COMPARISON:  None. FINDINGS: The mineralization and alignment are normal. There is no evidence of acute fracture or dislocation. Advanced patellofemoral joint space narrowing. Mild medial compartment joint space narrowing. Possible central ossified loose body versus fragmented spurring. No significant joint effusion. IMPRESSION: Degenerative changes, most advanced in the patellofemoral compartment. Possible central ossified loose body. Electronically Signed   By: Richardean Sale M.D.   On: 01/12/2018 10:57   Dg Knee Complete 4 Views Right  Result Date: 01/12/2018 CLINICAL DATA:  Pain in both knees, unspecified chronicity. EXAM: RIGHT KNEE - COMPLETE 4+ VIEW COMPARISON:  None. FINDINGS: The mineralization and  alignment are normal. There is no evidence of acute fracture or dislocation. Advanced patellofemoral degenerative changes with osteophytes. Mild medial compartment joint space narrowing and osteophytes. No significant knee joint effusion. IMPRESSION: Degenerative changes as described, most advanced in the patellofemoral compartment.  No acute osseous findings. Electronically Signed   By: Richardean Sale M.D.   On: 01/12/2018 10:58   Dg Hip Unilat With Pelvis 2-3 Views Left  Result Date: 01/12/2018 CLINICAL DATA:  Left hip pain. EXAM: DG HIP (WITH OR WITHOUT PELVIS) 2-3V LEFT COMPARISON:  None. FINDINGS: The mineralization and alignment are normal. There is no evidence of acute fracture or dislocation. No evidence of femoral head avascular necrosis. There are mild degenerative changes of both hips, worse on the left. Sacroiliac degenerative changes are also slightly worse on the left. No erosive changes are seen. Small pelvic calcifications are likely phleboliths. IMPRESSION: No acute osseous findings. Degenerative changes of the hips and sacroiliac joints, worse on the left. Electronically Signed   By: Richardean Sale M.D.   On: 01/12/2018 11:00   Lab Results  Component Value Date   WBC 3.6 (L) 11/09/2016   HGB 11.9 (L) 11/09/2016   HCT 35.7 (L) 11/09/2016   MCV 82.1 11/09/2016   PLT 211.0 11/09/2016    Lab Results  Component Value Date   CREATININE 0.81 11/09/2016   BUN 10 11/09/2016   NA 140 11/09/2016   K 3.9 11/09/2016   CL 104 11/09/2016   CO2 30 11/09/2016    Lab Results  Component Value Date   ALT 11 11/09/2016   AST 13 11/09/2016   ALKPHOS 70 11/09/2016   BILITOT 0.5 11/09/2016    Lab Results  Component Value Date   TSH 4.11 02/13/2016    Lab Results  Component Value Date   HGBA1C 5.7 02/13/2016    Lab Results  Component Value Date   CHOL 297 (H) 02/13/2016   HDL 48.60 02/13/2016   LDLCALC 227 (H) 02/13/2016   LDLDIRECT 188.6 07/10/2013   TRIG 106.0 02/13/2016   CHOLHDL 6 02/13/2016   Lab Results  Component Value Date   CREATININE 0.81 11/09/2016   CREATININE 1.2 (A) 06/22/2016   CREATININE 1.2 (A) 06/22/2016    ASSESSMENT AND PLAN:  Paula Meyer was seen today for knee pain and hip pain.  Diagnoses and all orders for this visit:  Pain in both knees, unspecified chronicity: DJD.   We have had a long discussion today about the spectrum of arthritis and its treatment.  She would like this explore her options with medication, physical therapy and orthopedic referral.  I have advised that she try to take Tylenol for minor aches and pains.  Diclofenac should also be helpful on her knees as well.  Water aerobics would also be helpful and she will explore this.  Referral to Ortho and physical therapy are out -     DG Knee Complete 4 Views Left -     DG Knee Complete 4 Views Right  Hip pain, left -     DG HIP UNILAT WITH PELVIS 2-3 VIEWS LEFT  Other orders -     Ambulatory referral to Physical Therapy -     predniSONE (DELTASONE) 20 MG tablet; Take 2 tablets (40 mg total) by mouth daily with breakfast. -     AMB referral to orthopedics    The patient is advised to call or return to clinic if she does not see an improvement in symptoms, or to seek the care of  the closest emergency department if she worsens with the above plan.   Philis Fendt, MHS, PA-C [facility] 01/12/2018 11:35 AM

## 2018-01-12 NOTE — Patient Instructions (Addendum)
Your x-rays show arthritis.  Please start by taking Tylenol 1000 mg every 8 hours.  I would like for you to consider this as base pain control.  Once this fails or starts to fail we can consider adding more medication.  Diclofenac is probably the safest next option to add to Tylenol.  This is the gel.  If the Tylenol in the gel or not working the try tramadol.  Given you seem to be having a flare of the arthritis right now we will start you on a short course of steroids.  It is okay to use the gel, Tylenol and tramadol with prednisone.  Continue to avoid NSAIDs for now.  We will schedule him with the physical therapist and I have made that referral today.  You should be hearing something in the next few days regarding your first appointment.  I think water aerobics is also an excellent idea.  I have scheduled you to see an orthopedist in about 6 weeks or so.  If you are getting better and we can manage the pain I think is best to cancel the orthopedic appointment.  Come back in about 6 weeks so we can talk about how you are doing.  Dg Knee Complete 4 Views Left  Result Date: 01/12/2018 CLINICAL DATA:  Pain in both knees, unspecified chronicity. EXAM: LEFT KNEE - COMPLETE 4+ VIEW COMPARISON:  None. FINDINGS: The mineralization and alignment are normal. There is no evidence of acute fracture or dislocation. Advanced patellofemoral joint space narrowing. Mild medial compartment joint space narrowing. Possible central ossified loose body versus fragmented spurring. No significant joint effusion. IMPRESSION: Degenerative changes, most advanced in the patellofemoral compartment. Possible central ossified loose body. Electronically Signed   By: Richardean Sale M.D.   On: 01/12/2018 10:57   Dg Knee Complete 4 Views Right  Result Date: 01/12/2018 CLINICAL DATA:  Pain in both knees, unspecified chronicity. EXAM: RIGHT KNEE - COMPLETE 4+ VIEW COMPARISON:  None. FINDINGS: The mineralization and alignment are  normal. There is no evidence of acute fracture or dislocation. Advanced patellofemoral degenerative changes with osteophytes. Mild medial compartment joint space narrowing and osteophytes. No significant knee joint effusion. IMPRESSION: Degenerative changes as described, most advanced in the patellofemoral compartment. No acute osseous findings. Electronically Signed   By: Richardean Sale M.D.   On: 01/12/2018 10:58   Dg Hip Unilat With Pelvis 2-3 Views Left  Result Date: 01/12/2018 CLINICAL DATA:  Left hip pain. EXAM: DG HIP (WITH OR WITHOUT PELVIS) 2-3V LEFT COMPARISON:  None. FINDINGS: The mineralization and alignment are normal. There is no evidence of acute fracture or dislocation. No evidence of femoral head avascular necrosis. There are mild degenerative changes of both hips, worse on the left. Sacroiliac degenerative changes are also slightly worse on the left. No erosive changes are seen. Small pelvic calcifications are likely phleboliths. IMPRESSION: No acute osseous findings. Degenerative changes of the hips and sacroiliac joints, worse on the left. Electronically Signed   By: Richardean Sale M.D.   On: 01/12/2018 11:00      IF you received an x-ray today, you will receive an invoice from [facility]. Please contact [facility] at [phone] with questions or concerns regarding your invoice.   IF you received labwork today, you will receive an invoice from [facility]. Please contact [facility] at [phone] with questions or concerns regarding your invoice.   Our billing staff will not be able to assist you with questions regarding bills from these companies.  You  will be contacted with the lab results as soon as they are available. The fastest way to get your results is to activate your My Chart account. Instructions are located on the last page of this paperwork. If you have not heard from [place] regarding the results in 2 weeks, please contact this office.

## 2018-02-23 ENCOUNTER — Encounter: Payer: Self-pay | Admitting: Physician Assistant

## 2018-02-23 ENCOUNTER — Ambulatory Visit: Payer: BC Managed Care – PPO | Admitting: Physician Assistant

## 2018-02-23 ENCOUNTER — Other Ambulatory Visit: Payer: Self-pay

## 2018-02-23 VITALS — BP 154/90 | HR 70 | Temp 98.0°F | Resp 18 | Ht 64.37 in | Wt 208.2 lb

## 2018-02-23 DIAGNOSIS — M25552 Pain in left hip: Secondary | ICD-10-CM

## 2018-02-23 DIAGNOSIS — M25561 Pain in right knee: Secondary | ICD-10-CM | POA: Diagnosis not present

## 2018-02-23 DIAGNOSIS — M549 Dorsalgia, unspecified: Secondary | ICD-10-CM | POA: Diagnosis not present

## 2018-02-23 DIAGNOSIS — M25562 Pain in left knee: Secondary | ICD-10-CM | POA: Diagnosis not present

## 2018-02-23 DIAGNOSIS — M722 Plantar fascial fibromatosis: Secondary | ICD-10-CM | POA: Diagnosis not present

## 2018-02-23 DIAGNOSIS — G8929 Other chronic pain: Secondary | ICD-10-CM

## 2018-02-23 MED ORDER — TRAMADOL HCL 50 MG PO TABS: 50.0000 mg | ORAL_TABLET | Freq: Four times a day (QID) | ORAL | 0 refills | Status: DC | PRN

## 2018-02-23 MED ORDER — DICLOFENAC SODIUM 1 % TD GEL: 4.0000 g | Freq: Four times a day (QID) | TRANSDERMAL | 0 refills | Status: DC

## 2018-02-23 NOTE — Progress Notes (Signed)
02/23/2018 1:25 PM   DOB: 1951-07-27 / MRN: [id number]  SUBJECTIVE:  Paula Meyer is a 67 y.o. female presenting for recheck of knee pain.  Tells me this improved with tylenol, diclofenac topical, and prednisone.     Complains of longstanding back pain.  Would like PT for this.  Has not tried water aerobics yet but knows where to go.    Complains of new pain about the right heal.  Worse with getting up in the morning and taking the first few steps.    She is allergic to paxil [paroxetine hcl]; bupropion hcl; contrast media [iodinated diagnostic agents]; and tetracycline.   She  has a past medical history of Allergy, Anxiety, Asthma, Depression, colonic polyps, Hyperlipidemia, Hypertension, and Osteoarthritis.    She  reports that she has quit smoking. she has never used smokeless tobacco. She reports that she drinks about 1.8 oz of alcohol per week. She reports that she does not use drugs. She  reports that she does not currently engage in sexual activity. The patient  has a past surgical history that includes Tubal ligation.  Her family history includes Arthritis in her other; Depression in her other; Hyperlipidemia in her brother; Hypertension in her brother.  Review of Systems  Constitutional: Negative for chills, diaphoresis and fever.  Eyes: Negative.   Respiratory: Negative for shortness of breath.   Cardiovascular: Negative for chest pain, orthopnea and leg swelling.  Gastrointestinal: Negative for abdominal pain, blood in stool, constipation, diarrhea, heartburn, melena, nausea and vomiting.  Genitourinary: Negative for flank pain.  Skin: Negative for rash.  Neurological: Negative for dizziness, sensory change, speech change, focal weakness and headaches.    The problem list and medications were reviewed and updated by myself where necessary and exist elsewhere in the encounter.   OBJECTIVE:  BP (!) 154/90   Pulse 70   Temp 98 F (36.7 C) (Oral)   Resp 18   Ht 5'  4.37" (1.635 m)   Wt 208 lb 3.2 oz (94.4 kg)   LMP 04/02/2011   SpO2 98%   BMI 35.33 kg/m   Physical Exam  Constitutional: She is active.  Non-toxic appearance.  Cardiovascular: Normal rate, regular rhythm, S1 normal, S2 normal, normal heart sounds and intact distal pulses. Exam reveals no gallop, no friction rub and no decreased pulses.  No murmur heard. Pulmonary/Chest: Effort normal. No stridor. No tachypnea. No respiratory distress. She has no wheezes. She has no rales.  Abdominal: She exhibits no distension.  Musculoskeletal: She exhibits no edema.  Neurological: She is alert.  Skin: Skin is warm and dry. She is not diaphoretic. No pallor.    No results found for this or any previous visit (from the past 72 hour(s)).  No results found.  ASSESSMENT AND PLAN:  Paula Meyer was seen today for joint swelling and foot pain.  Diagnoses and all orders for this visit:  Chronic back pain greater than 3 months duration: [facility] If [facility] do she will have the department send me a referral form.   Plantar fasciitis -     Ambulatory referral to Podiatry  Pain in both knees, unspecified chronicity -     diclofenac sodium (VOLTAREN) 1 % GEL; Apply 4 g topically 4 (four) times daily. -     traMADol (ULTRAM) 50 MG tablet; Take 1 tablet (50 mg total) by mouth every 6 (six) hours as needed for severe pain. COntinue to  use sparingly.  Hip pain, left -     traMADol (ULTRAM) 50 MG tablet; Take 1 tablet (50 mg total) by mouth every 6 (six) hours as needed for severe pain. COntinue to use sparingly.    The patient is advised to call or return to clinic if she does not see an improvement in symptoms, or to seek the care of the closest emergency department if she worsens with the above plan.   Philis Fendt, MHS, PA-C [facility] at [facility] 02/23/2018 1:25 PM

## 2018-02-23 NOTE — Patient Instructions (Signed)
     IF you received an x-ray today, you will receive an invoice from [facility]. Please contact [facility] at [phone] with questions or concerns regarding your invoice.   IF you received labwork today, you will receive an invoice from [facility]. Please contact [facility] at [phone] with questions or concerns regarding your invoice.   Our billing staff will not be able to assist you with questions regarding bills from these companies.  You will be contacted with the lab results as soon as they are available. The fastest way to get your results is to activate your My Chart account. Instructions are located on the last page of this paperwork. If you have not heard from us regarding the results in 2 weeks, please contact this office.     

## 2018-03-17 ENCOUNTER — Telehealth: Payer: Self-pay | Admitting: Podiatry

## 2018-03-17 ENCOUNTER — Ambulatory Visit (INDEPENDENT_AMBULATORY_CARE_PROVIDER_SITE_OTHER): Payer: BC Managed Care – PPO

## 2018-03-17 ENCOUNTER — Ambulatory Visit: Payer: BC Managed Care – PPO | Admitting: Podiatry

## 2018-03-17 ENCOUNTER — Encounter: Payer: Self-pay | Admitting: Podiatry

## 2018-03-17 DIAGNOSIS — F329 Major depressive disorder, single episode, unspecified: Secondary | ICD-10-CM | POA: Insufficient documentation

## 2018-03-17 DIAGNOSIS — M722 Plantar fascial fibromatosis: Secondary | ICD-10-CM

## 2018-03-17 DIAGNOSIS — F32A Depression, unspecified: Secondary | ICD-10-CM | POA: Insufficient documentation

## 2018-03-17 MED ORDER — METHYLPREDNISOLONE 4 MG PO TBPK: ORAL_TABLET | ORAL | 0 refills | Status: DC

## 2018-03-17 NOTE — Telephone Encounter (Signed)
I had an appointment with Dr. Milinda Pointer at 9:30 am this morning. I believe I left the blue boot there that he gave me that he wanted me to sleep in. Could you please check into that? I will head back there but please call me back at [phone]. Thank you.

## 2018-03-17 NOTE — Progress Notes (Signed)
Subjective:  Patient ID: Paula Meyer, female    DOB: 05/10/1951,  MRN: [id number] HPI Chief Complaint  Patient presents with  . Foot Pain    Plantar heel right - aching x few months, AM pain    67 y.o. female presents with the above complaint.  ROS: Denies fever chills nausea vomiting muscle aches and pains.  Calf pain chest pain shortness of breath headache.  Past Medical History:  Diagnosis Date  . Allergy   . Anxiety   . Asthma   . Depression   . Hx of colonic polyps   . Hyperlipidemia   . Hypertension   . Osteoarthritis    Past Surgical History:  Procedure Laterality Date  . TUBAL LIGATION      Current Outpatient Medications:  .  albuterol (PROVENTIL HFA;VENTOLIN HFA) 108 (90 Base) MCG/ACT inhaler, Inhale 2 puffs into the lungs every 4 (four) hours as needed for wheezing or shortness of breath (cough, shortness of breath or wheezing.)., Disp: 1 Inhaler, Rfl: 1 .  Azelastine HCl 0.15 % SOLN, USE 1 SPRAY IN THE NOSE TWICE DAILY, Disp: 30 mL, Rfl: 11 .  busPIRone (BUSPAR) 5 MG tablet, Take 1 tablet (5 mg total) by mouth 2 (two) times daily. (Patient not taking: Reported on 01/12/2018), Disp: 30 tablet, Rfl: 0 .  cholecalciferol (VITAMIN D) 1000 UNITS tablet, Take 1,000 Units by mouth daily. Reported on 07/02/2016, Disp: , Rfl:  .  diclofenac sodium (VOLTAREN) 1 % GEL, Apply 4 g topically 4 (four) times daily., Disp: 100 g, Rfl: 0 .  DULoxetine (CYMBALTA) 30 MG capsule, Take 1-2 capsules (30-60 mg total) by mouth daily., Disp: 60 capsule, Rfl: 2 .  MIMVEY 1-0.5 MG tablet, Take 1 tablet by mouth daily. prn, Disp: , Rfl: 3 .  potassium chloride SA (K-DUR,KLOR-CON) 20 MEQ tablet, Take 0.5 tablets (10 mEq total) by mouth daily. As needed (Patient not taking: Reported on 02/23/2018), Disp: 90 tablet, Rfl: 3 .  predniSONE (DELTASONE) 20 MG tablet, Take 2 tablets (40 mg total) by mouth daily with breakfast., Disp: 10 tablet, Rfl: 0 .  traMADol (ULTRAM) 50 MG tablet, Take 1 tablet  (50 mg total) by mouth every 6 (six) hours as needed for severe pain. COntinue to use sparingly., Disp: 90 tablet, Rfl: 0  Allergies  Allergen Reactions  . Paxil [Paroxetine Hcl]     Makes her grind her teeth  . Bupropion Hcl Other (See Comments)    palpitations  . Contrast Media [Iodinated Diagnostic Agents] Hives and Itching  . Tetracycline Nausea Only   Review of Systems Objective:  There were no vitals filed for this visit.  General: Well developed, nourished, in no acute distress, alert and oriented x3   Dermatological: Skin is warm, dry and supple bilateral. Nails x 10 are well maintained; remaining integument appears unremarkable at this time. There are no open sores, no preulcerative lesions, no rash or signs of infection present.  Vascular: Dorsalis Pedis artery and Posterior Tibial artery pedal pulses are 2/4 bilateral with immedate capillary fill time. Pedal hair growth present. No varicosities and no lower extremity edema present bilateral.   Neruologic: Grossly intact via light touch bilateral. Vibratory intact via tuning fork bilateral. Protective threshold with Semmes Wienstein monofilament intact to all pedal sites bilateral. Patellar and Achilles deep tendon reflexes 2+ bilateral. No Babinski or clonus noted bilateral.   Musculoskeletal: No gross boney pedal deformities bilateral. No pain, crepitus, or limitation noted with foot and ankle range of motion bilateral.  Muscular strength 5/5 in all groups tested bilateral.  Pain on palpation medial aspect of the right heel.  No pain on medial and lateral compression of the calcaneus.  Gait: Unassisted, Nonantalgic.    Radiographs:  Demonstrates an osseously mature individual with no acute findings.  Soft tissue increase in density at the plantar fascial calcaneal insertion site of the right heel indicative of plantar fasciitis.  Assessment & Plan:   Assessment: Plantar fasciitis right.  Plan: Discussed etiology  pathology conservative versus surgical therapies.  Injected 20 mg of Kenalog 5 mg Marcaine after sterile Betadine skin prep to the medial aspect of the right foot.  She tolerated procedure well.  Posterior and a plantar fascial brace to be followed by a night splint.  Discussed appropriate shoe gear stretching exercises ice therapy shoe gear modifications.  She was provided with both oral and written home-going instructions for stretching.  Also provided her with a prescription for a Medrol Dosepak.  I will follow-up with her in 1 month.     Max T. [place], [place]

## 2018-03-17 NOTE — Patient Instructions (Signed)

## 2018-05-05 ENCOUNTER — Encounter

## 2018-05-05 ENCOUNTER — Ambulatory Visit: Payer: BC Managed Care – PPO | Admitting: Podiatry

## 2018-05-05 DIAGNOSIS — M722 Plantar fascial fibromatosis: Secondary | ICD-10-CM | POA: Diagnosis not present

## 2018-05-05 NOTE — Progress Notes (Signed)
  Subjective:  Patient ID: Paula Meyer, female    DOB: 04/08/1951,  MRN: [id number]  Chief Complaint  Patient presents with  . Plantar Fasciitis    right -got better for a while, but needs another injection - she is traveling this weekend   67 y.o. female presents for f/u R heel pain. States the injection did help. Is traveling this weekend requesting repeat injection  Objective:  There were no vitals filed for this visit. General AA&O x3. Normal mood and affect.  Vascular Pedal pulses palpable.  Neurologic Epicritic sensation grossly intact.  Dermatologic No open lesions. Skin normal texture and turgor.  Orthopedic: Pain to palpation medial calcaneal tuber right   Assessment & Plan:  Patient was evaluated and treated and all questions answered.  Plantar Fasciitis, right - XR reviewed as above.  - Educated on icing and stretching. Instructions given.  - Injection delivered to the plantar fascia as below. - Night splint dispensed.  Procedure: Injection Tendon/Ligament Location: Right plantar fascia at the glabrous junction; medial approach. Skin Prep: Alcohol. Injectate: 1 cc 0.5% marcaine plain, 1 cc dexamethasone phosphate, 0.5 cc kenalog 10. Disposition: Patient tolerated procedure well. Injection site dressed with a band-aid.  Return in about 3 weeks (around 05/26/2018) for Plantar fasciitis.

## 2018-05-17 ENCOUNTER — Ambulatory Visit: Payer: BC Managed Care – PPO | Admitting: Podiatry

## 2018-05-20 ENCOUNTER — Ambulatory Visit: Payer: BC Managed Care – PPO | Admitting: Podiatry

## 2018-05-20 DIAGNOSIS — M722 Plantar fascial fibromatosis: Secondary | ICD-10-CM

## 2018-05-20 MED ORDER — MELOXICAM 15 MG PO TABS
15.0000 mg | ORAL_TABLET | Freq: Every day | ORAL | 0 refills | Status: DC
Start: 2018-05-20 — End: 2018-06-16

## 2018-05-20 NOTE — Progress Notes (Signed)
  Subjective:  Patient ID: Paula Meyer, female    DOB: [birth year],  MRN: [id number]  Chief Complaint  Patient presents with  . Plantar Fasciitis    right foot still having heel pain and achiness   67 y.o. female returns for the above complaint. States the pain is still there. Doing stretches. Injeciton helped. Pain is improving.  Objective:  There were no vitals filed for this visit. General AA&O x3. Normal mood and affect.  Vascular Pedal pulses palpable.  Neurologic Epicritic sensation grossly intact.  Dermatologic No open lesions. Skin normal texture and turgor.  Orthopedic: POP R medial calc tuber   Assessment & Plan:  Patient was evaluated and treated and all questions answered.  Plantar Fasciitis -Injection #2 as below. -Appt for orthotics. -Rx Meloxicam  Procedure: Injection Tendon/Ligament Consent: Verbal consent obtained. Location: Right plantar fascia at the glabrous junction; medial approach. Skin Prep: Alcohol. Injectate: 1 cc 0.5% marcaine plain, 1 cc dexamethasone phosphate, 0.5 cc kenalog 10. Disposition: Patient tolerated procedure well. Injection site dressed with a band-aid.    Return in about 1 month (around 06/17/2018) for Plantar fasciitis.

## 2018-05-23 ENCOUNTER — Ambulatory Visit (INDEPENDENT_AMBULATORY_CARE_PROVIDER_SITE_OTHER): Payer: BC Managed Care – PPO | Admitting: Orthotics

## 2018-05-23 DIAGNOSIS — M722 Plantar fascial fibromatosis: Secondary | ICD-10-CM | POA: Diagnosis not present

## 2018-05-23 NOTE — Progress Notes (Signed)

## 2018-06-13 ENCOUNTER — Ambulatory Visit: Payer: BLUE CROSS/BLUE SHIELD | Admitting: Orthotics

## 2018-06-13 DIAGNOSIS — M722 Plantar fascial fibromatosis: Secondary | ICD-10-CM

## 2018-06-13 NOTE — Progress Notes (Signed)
Patient came in today to pick up custom made foot orthotics.  The goals were accomplished and the patient reported no dissatisfaction with said orthotics.  Patient was advised of breakin period and how to report any issues. 

## 2018-06-16 ENCOUNTER — Other Ambulatory Visit: Payer: Self-pay | Admitting: Podiatry

## 2018-06-27 ENCOUNTER — Other Ambulatory Visit (INDEPENDENT_AMBULATORY_CARE_PROVIDER_SITE_OTHER): Payer: BC Managed Care – PPO

## 2018-06-27 ENCOUNTER — Ambulatory Visit: Payer: BC Managed Care – PPO | Admitting: Internal Medicine

## 2018-06-27 ENCOUNTER — Other Ambulatory Visit: Payer: Self-pay | Admitting: Internal Medicine

## 2018-06-27 ENCOUNTER — Encounter: Payer: Self-pay | Admitting: Internal Medicine

## 2018-06-27 VITALS — BP 140/90 | HR 79 | Temp 98.8°F | Ht 64.37 in | Wt 206.0 lb

## 2018-06-27 DIAGNOSIS — R5383 Other fatigue: Secondary | ICD-10-CM

## 2018-06-27 DIAGNOSIS — F418 Other specified anxiety disorders: Secondary | ICD-10-CM

## 2018-06-27 LAB — COMPREHENSIVE METABOLIC PANEL
ALK PHOS: 70 U/L (ref 39–117)
ALT: 21 U/L (ref 0–35)
AST: 16 U/L (ref 0–37)
Albumin: 4.2 g/dL (ref 3.5–5.2)
BILIRUBIN TOTAL: 0.4 mg/dL (ref 0.2–1.2)
BUN: 10 mg/dL (ref 6–23)
CO2: 27 mEq/L (ref 19–32)
Calcium: 9.4 mg/dL (ref 8.4–10.5)
Chloride: 107 mEq/L (ref 96–112)
Creatinine, Ser: 0.89 mg/dL (ref 0.40–1.20)
GFR: 81.88 mL/min (ref >60.00)
Glucose, Bld: 94 mg/dL (ref 70–99)
Potassium: 3.8 mEq/L (ref 3.5–5.1)
Sodium: 142 mEq/L (ref 135–145)
TOTAL PROTEIN: 7.3 g/dL (ref 6.0–8.3)

## 2018-06-27 LAB — TSH: TSH: 2.1 u[IU]/mL (ref 0.35–4.50)

## 2018-06-27 LAB — CBC
HCT: 37.1 % (ref 36.0–46.0)
HEMOGLOBIN: 12.2 g/dL (ref 12.0–15.0)
MCHC: 32.8 g/dL (ref 30.0–36.0)
MCV: 83.8 fl (ref 78.0–100.0)
Platelets: 187 10*3/uL (ref 150.0–400.0)
RBC: 4.43 Mil/uL (ref 3.87–5.11)
RDW: 14.2 % (ref 11.5–15.5)
WBC: 3.4 10*3/uL — AB (ref 4.0–10.5)

## 2018-06-27 LAB — FERRITIN: Ferritin: 105 ng/mL (ref 10.0–291.0)

## 2018-06-27 LAB — VITAMIN B12: Vitamin B-12: 514 pg/mL (ref 211–911)

## 2018-06-27 LAB — HEMOGLOBIN A1C: HEMOGLOBIN A1C: 5.7 % (ref 4.6–6.5)

## 2018-06-27 LAB — VITAMIN D 25 HYDROXY (VIT D DEFICIENCY, FRACTURES)

## 2018-06-27 LAB — T4, FREE: Free T4: 0.7 ng/dL (ref 0.60–1.60)

## 2018-06-27 MED ORDER — VITAMIN D (ERGOCALCIFEROL) 1.25 MG (50000 UNIT) PO CAPS: 50000.0000 [IU] | ORAL_CAPSULE | ORAL | 0 refills | Status: DC

## 2018-06-27 MED ORDER — DULOXETINE HCL 30 MG PO CPEP: 30.0000 mg | ORAL_CAPSULE | Freq: Every day | ORAL | 0 refills | Status: DC

## 2018-06-27 NOTE — Assessment & Plan Note (Signed)
Checking labs today, suspect some of this is related to lack of purpose and depression since retiring. Given 1 month cymbalta and asked to follow up with pcp.

## 2018-06-27 NOTE — Patient Instructions (Signed)
We have sent in a refill of the cymbalta to take 1 pill daily for 1 month. See your doctor within a month to evaluate whether this medicine is working for you.

## 2018-06-27 NOTE — Assessment & Plan Note (Signed)
Declined request for xanax as she did not return to see pcp after our last visit and sought controlled substances at another facility. Rx for 1 month supply cymbalta and she will have to return to pcp within that month to continue therapy.

## 2018-06-27 NOTE — Progress Notes (Signed)
   Subjective:    Patient ID: Paula Meyer, female    DOB: July 09, 1951, 67 y.o.   MRN: [id number]  HPI The patient is a 67 YO female coming in for concerns about her energy level. Has gotten tramadol #90 from another provider at different practice twice since last time at our office. She is having no energy since retirement about 1-2 years ago. Denies weight change. Denies SOB or chest pains. Denies diarrhea or constipation. She is also asking about xanax. She started taking cymbalta after our last visit (about 8 months ago) and stopped taking it for some reason. She does not recall side effects. She thinks she did feel better. She has not seen PCP since that time and relates that it is hard to get it with pcp.   Review of Systems  Constitutional: Positive for fatigue.  HENT: Negative.   Eyes: Negative.   Respiratory: Negative for cough, chest tightness and shortness of breath.   Cardiovascular: Negative for chest pain, palpitations and leg swelling.  Gastrointestinal: Negative for abdominal distention, abdominal pain, constipation, diarrhea, nausea and vomiting.  Musculoskeletal: Negative.   Skin: Negative.   Neurological: Negative.   Psychiatric/Behavioral: Positive for decreased concentration and dysphoric mood. The patient is nervous/anxious.       Objective:   Physical Exam  Constitutional: She is oriented to person, place, and time. She appears well-developed and well-nourished.  HENT:  Head: Normocephalic and atraumatic.  Eyes: EOM are normal.  Neck: Normal range of motion.  Cardiovascular: Normal rate and regular rhythm.  Pulmonary/Chest: Effort normal and breath sounds normal. No respiratory distress. She has no wheezes. She has no rales.  Abdominal: Soft. Bowel sounds are normal. She exhibits no distension. There is no tenderness. There is no rebound.  Musculoskeletal: She exhibits no edema.  Neurological: She is alert and oriented to person, place, and time. Coordination  normal.  Skin: Skin is warm and dry.   Vitals:   06/27/18 1335  BP: 140/90  Pulse: 79  Temp: 98.8 F (37.1 C)  TempSrc: Oral  SpO2: 98%  Weight: 206 lb (93.4 kg)  Height: 5' 4.37" (1.635 m)      Assessment & Plan:

## 2018-07-01 ENCOUNTER — Ambulatory Visit: Payer: BC Managed Care – PPO | Admitting: Podiatry

## 2018-07-24 ENCOUNTER — Other Ambulatory Visit: Payer: Self-pay | Admitting: Internal Medicine

## 2018-08-04 ENCOUNTER — Encounter: Payer: Self-pay | Admitting: Nurse Practitioner

## 2018-08-04 ENCOUNTER — Ambulatory Visit (INDEPENDENT_AMBULATORY_CARE_PROVIDER_SITE_OTHER): Payer: Medicare Other | Admitting: Nurse Practitioner

## 2018-08-04 VITALS — BP 130/90 | HR 75 | Temp 98.7°F | Ht 64.37 in | Wt 208.0 lb

## 2018-08-04 DIAGNOSIS — R110 Nausea: Secondary | ICD-10-CM

## 2018-08-04 DIAGNOSIS — K219 Gastro-esophageal reflux disease without esophagitis: Secondary | ICD-10-CM

## 2018-08-04 MED ORDER — OMEPRAZOLE 40 MG PO CPDR: 40.0000 mg | DELAYED_RELEASE_CAPSULE | Freq: Every day | ORAL | 1 refills | Status: DC

## 2018-08-04 NOTE — Patient Instructions (Signed)
Please start omeprazole 40 once daily for acid reflux.  Please return in about 2-3 weeks so we can see how your symptoms are responding to the medication   Gastroesophageal Reflux Disease, Adult Normally, food travels down the esophagus and stays in the stomach to be digested. If a person has gastroesophageal reflux disease (GERD), food and stomach acid move back up into the esophagus. When this happens, the esophagus becomes sore and swollen (inflamed). Over time, GERD can make small holes (ulcers) in the lining of the esophagus. Follow these instructions at home: Diet  Follow a diet as told by your doctor. You may need to avoid foods and drinks such as: ? Coffee and tea (with or without caffeine). ? Drinks that contain alcohol. ? Energy drinks and sports drinks. ? Carbonated drinks or sodas. ? Chocolate and cocoa. ? Peppermint and mint flavorings. ? Garlic and onions. ? Horseradish. ? Spicy and acidic foods, such as peppers, chili powder, curry powder, vinegar, hot sauces, and BBQ sauce. ? Citrus fruit juices and citrus fruits, such as oranges, lemons, and limes. ? Tomato-based foods, such as red sauce, chili, salsa, and pizza with red sauce. ? Fried and fatty foods, such as donuts, french fries, potato chips, and high-fat dressings. ? High-fat meats, such as hot dogs, rib eye steak, sausage, ham, and bacon. ? High-fat dairy items, such as whole milk, butter, and cream cheese.  Eat small meals often. Avoid eating large meals.  Avoid drinking large amounts of liquid with your meals.  Avoid eating meals during the 2-3 hours before bedtime.  Avoid lying down right after you eat.  Do not exercise right after you eat. General instructions  Pay attention to any changes in your symptoms.  Take over-the-counter and prescription medicines only as told by your doctor. Do not take aspirin, ibuprofen, or other NSAIDs unless your doctor says it is okay.  Do not use any tobacco products,  including cigarettes, chewing tobacco, and e-cigarettes. If you need help quitting, ask your doctor.  Wear loose clothes. Do not wear anything tight around your waist.  Raise (elevate) the head of your bed about 6 inches (15 cm).  Try to lower your stress. If you need help doing this, ask your doctor.  If you are overweight, lose an amount of weight that is healthy for you. Ask your doctor about a safe weight loss goal.  Keep all follow-up visits as told by your doctor. This is important. Contact a doctor if:  You have new symptoms.  You lose weight and you do not know why it is happening.  You have trouble swallowing, or it hurts to swallow.  You have wheezing or a cough that keeps happening.  Your symptoms do not get better with treatment.  You have a hoarse voice. Get help right away if:  You have pain in your arms, neck, jaw, teeth, or back.  You feel sweaty, dizzy, or light-headed.  You have chest pain or shortness of breath.  You throw up (vomit) and your throw up looks like blood or coffee grounds.  You pass out (faint).  Your poop (stool) is bloody or black.  You cannot swallow, drink, or eat. This information is not intended to replace advice given to you by your health care provider. Make sure you discuss any questions you have with your health care provider. Document Released: 05/18/2008 Document Revised: 05/07/2016 Document Reviewed: 03/27/2015 Elsevier Interactive Patient Education  [employer].

## 2018-08-04 NOTE — Progress Notes (Signed)
Name: Paula Meyer   MRN: [id number]    DOB: May 21, 1951   Date:08/04/2018       Progress Note  Subjective  Chief Complaint  Nausea  HPI  Paula Meyer is here today for evaluation of nausea, which began about 1 month ago She noticed the nausea began after starting an herbalife diet in June, which she has since stopped but the nausea did not improve She also reports she has also tried to stop her mobic and cymbalta with no improvement in her nausea The nausea is constant throughout the day, seems worse when waking in the am or on an empty stomach She reports feeling sweaty at times and notes frequent belching and gas, has also noted feeling mildly SOB at times. She denies dizziness, weakness, syncope, vomiting, chest pain, cough, abdominal pain, constipation, diarrhea, rectal bleeding. Her screening colonoscopy is  up to date She took OTC antacid pill last week which seemed to temporarily relieve the nausea some but symptoms returned the next day  Patient Active Problem List   Diagnosis Date Noted  . Depressive disorder 03/17/2018  . Obesity 03/17/2018  . Seasonal affective disorder ([place]) 11/03/2017  . Anxiety 11/03/2017  . Recurrent urinary tract infection 12/15/2016  . Blood in stool 11/09/2016  . Kidney disease, chronic, stage III (GFR 30-59 ml/min) ([place]) 07/02/2016  . Myalgia and myositis 02/13/2016  . Other screening mammogram 07/10/2013  . B12 deficiency anemia 08/24/2012  . Deficiency anemia 08/23/2012  . Other abnormal glucose 08/23/2012  . GERD (gastroesophageal reflux disease) 06/30/2012  . Eczema 05/04/2012  . DDD (degenerative disc disease), lumbar 04/02/2011  . Other fatigue 04/02/2011  . Hyperlipidemia with target LDL less than 130 10/20/2010  . Depression with anxiety 10/20/2010  . Essential hypertension, benign 10/20/2010  . ALLERGIC RHINITIS 10/20/2010  . Mild persistent asthma 10/20/2010  . Osteoarthritis 10/20/2010    Past Surgical History:  Procedure  Laterality Date  . TUBAL LIGATION      Family History  Problem Relation Age of Onset  . Hyperlipidemia Brother   . Hypertension Brother   . Arthritis Other   . Depression Other   . Colon cancer Neg Hx   . Stomach cancer Neg Hx     Social History   Socioeconomic History  . Marital status: Divorced    Spouse name: Not on file  . Number of children: Not on file  . Years of education: Not on file  . Highest education level: Not on file  Occupational History  . Not on file  Social Needs  . Financial resource strain: Not on file  . Food insecurity:    Worry: Not on file    Inability: Not on file  . Transportation needs:    Medical: Not on file    Non-medical: Not on file  Tobacco Use  . Smoking status: Former Research scientist (life sciences)  . Smokeless tobacco: Never Used  Substance and Sexual Activity  . Alcohol use: Yes    Alcohol/week: 3.0 standard drinks    Types: 3 Glasses of wine per week    Comment: rarely  . Drug use: No  . Sexual activity: Not Currently  Lifestyle  . Physical activity:    Days per week: Not on file    Minutes per session: Not on file  . Stress: Not on file  Relationships  . Social connections:    Talks on phone: Not on file    Gets together: Not on file    Attends religious service: Not  on file    Active member of club or organization: Not on file    Attends meetings of clubs or organizations: Not on file    Relationship status: Not on file  . Intimate partner violence:    Fear of current or ex partner: Not on file    Emotionally abused: Not on file    Physically abused: Not on file    Forced sexual activity: Not on file  Other Topics Concern  . Not on file  Social History Narrative  . Not on file     Current Outpatient Medications:  .  albuterol (PROVENTIL HFA;VENTOLIN HFA) 108 (90 Base) MCG/ACT inhaler, Inhale 2 puffs into the lungs every 4 (four) hours as needed for wheezing or shortness of breath (cough, shortness of breath or wheezing.)., Disp: 1  Inhaler, Rfl: 1 .  Azelastine HCl 0.15 % SOLN, USE 1 SPRAY IN THE NOSE TWICE DAILY, Disp: 30 mL, Rfl: 11 .  cholecalciferol (VITAMIN D) 1000 UNITS tablet, Take 1,000 Units by mouth daily. Reported on 07/02/2016, Disp: , Rfl:  .  diclofenac sodium (VOLTAREN) 1 % GEL, Apply 4 g topically 4 (four) times daily., Disp: 100 g, Rfl: 0 .  MIMVEY 1-0.5 MG tablet, Take 1 tablet by mouth daily. prn, Disp: , Rfl: 3 .  traMADol (ULTRAM) 50 MG tablet, Take 1 tablet (50 mg total) by mouth every 6 (six) hours as needed for severe pain. COntinue to use sparingly., Disp: 90 tablet, Rfl: 0 .  Vitamin D, Ergocalciferol, (DRISDOL) 50000 units CAPS capsule, Take 1 capsule (50,000 Units total) by mouth every 7 (seven) days., Disp: 12 capsule, Rfl: 0 .  DULoxetine (CYMBALTA) 30 MG capsule, Take 1 capsule (30 mg total) by mouth daily. (Patient not taking: Reported on 08/04/2018), Disp: 30 capsule, Rfl: 0 .  meloxicam (MOBIC) 15 MG tablet, TAKE 1 TABLET BY MOUTH EVERY DAY (Patient not taking: Reported on 08/04/2018), Disp: 30 tablet, Rfl: 0  Allergies  Allergen Reactions  . Paxil [Paroxetine Hcl]     Makes her grind her teeth  . Bupropion Hcl Other (See Comments)    palpitations  . Contrast Media [Iodinated Diagnostic Agents] Hives and Itching  . Tetracycline Nausea Only     ROS See HPI  Objective  Vitals:   08/04/18 1007  BP: 130/90  Pulse: 75  Temp: 98.7 F (37.1 C)  TempSrc: Oral  SpO2: 97%  Weight: 208 lb (94.3 kg)  Height: 5' 4.37" (1.635 m)    Body mass index is 35.29 kg/m.  Physical Exam Vital signs reviewed. Constitutional: Patient appears well-developed and well-nourished. No distress.  HENT: Head: Normocephalic and atraumatic.  Nose: Nose normal. Mouth/Throat: Oropharynx is clear and moist. No oropharyngeal exudate.  Eyes: Conjunctivae and EOM are normal. Pupils are equal, round, and reactive to light. No scleral icterus.  Neck: Normal range of motion. Neck supple.  Cardiovascular: Normal  rate, regular rhythm and normal heart sounds. Distal pulses intact. Pulmonary/Chest: Effort normal and breath sounds normal. No respiratory distress. Abdominal: Soft, rotund. Bowel sounds are normal, no distension. There is no tenderness. no masses Neurological: She is alert and oriented to person, place, and time. No cranial nerve deficit. Coordination, balance, strength, speech and gait are normal.  Skin: Skin is warm and dry. No rash noted. No erythema.  Psychiatric: Patient has a normal mood and affect. behavior is normal. Judgment and thought content normal.   Assessment & Plan RTC in 2 weeks for F/U Recent Labwork done on 06/27/18 with  stable CBC, CMET  1. Nausea PE is normal today and the patient appears overall stable with Symptoms suspicious for GERD and documented GERD history Will start prilosec trial today- dosing and side effects discussed Home management of GERD including return precautions and when to seek emergency care discussed and printed on AVS RTC in 2 weeks for F/U - omeprazole (PRILOSEC) 40 MG capsule; Take 1 capsule (40 mg total) by mouth daily.  Dispense: 30 capsule; Refill: 1  2. Gastroesophageal reflux disease, esophagitis presence not specified PE is normal today and the patient appears overall stable with Symptoms suspicious for GERD and documented GERD history Will start prilosec trial today- dosing and side effects discussed Home management of GERD including return precuations and when to seek emergency care discussed and printed on AVS RTC in 2 weeks for F/U - omeprazole (PRILOSEC) 40 MG capsule; Take 1 capsule (40 mg total) by mouth daily.  Dispense: 30 capsule; Refill: 1

## 2018-08-23 ENCOUNTER — Other Ambulatory Visit (INDEPENDENT_AMBULATORY_CARE_PROVIDER_SITE_OTHER): Payer: Medicare Other

## 2018-08-23 ENCOUNTER — Encounter: Payer: Self-pay | Admitting: Internal Medicine

## 2018-08-23 ENCOUNTER — Ambulatory Visit (INDEPENDENT_AMBULATORY_CARE_PROVIDER_SITE_OTHER): Payer: Medicare Other | Admitting: Internal Medicine

## 2018-08-23 VITALS — BP 160/102 | HR 81 | Temp 98.4°F | Resp 16 | Ht 64.37 in | Wt 209.0 lb

## 2018-08-23 DIAGNOSIS — E559 Vitamin D deficiency, unspecified: Secondary | ICD-10-CM

## 2018-08-23 DIAGNOSIS — K5904 Chronic idiopathic constipation: Secondary | ICD-10-CM | POA: Diagnosis not present

## 2018-08-23 DIAGNOSIS — K2100 Gastro-esophageal reflux disease with esophagitis, without bleeding: Secondary | ICD-10-CM

## 2018-08-23 DIAGNOSIS — K219 Gastro-esophageal reflux disease without esophagitis: Secondary | ICD-10-CM

## 2018-08-23 DIAGNOSIS — E785 Hyperlipidemia, unspecified: Secondary | ICD-10-CM

## 2018-08-23 DIAGNOSIS — Z23 Encounter for immunization: Secondary | ICD-10-CM | POA: Diagnosis not present

## 2018-08-23 DIAGNOSIS — I10 Essential (primary) hypertension: Secondary | ICD-10-CM

## 2018-08-23 DIAGNOSIS — F411 Generalized anxiety disorder: Secondary | ICD-10-CM

## 2018-08-23 DIAGNOSIS — M159 Polyosteoarthritis, unspecified: Secondary | ICD-10-CM

## 2018-08-23 DIAGNOSIS — M150 Primary generalized (osteo)arthritis: Secondary | ICD-10-CM | POA: Diagnosis not present

## 2018-08-23 DIAGNOSIS — R110 Nausea: Secondary | ICD-10-CM

## 2018-08-23 LAB — BASIC METABOLIC PANEL
BUN: 13 mg/dL (ref 6–23)
CO2: 30 meq/L (ref 19–32)
CREATININE: 0.87 mg/dL (ref 0.40–1.20)
Calcium: 9.5 mg/dL (ref 8.4–10.5)
Chloride: 103 mEq/L (ref 96–112)
GFR: 84.02 mL/min (ref >60.00)
Glucose, Bld: 80 mg/dL (ref 70–99)
Potassium: 3.8 mEq/L (ref 3.5–5.1)
Sodium: 140 mEq/L (ref 135–145)

## 2018-08-23 LAB — URINALYSIS, ROUTINE W REFLEX MICROSCOPIC
BILIRUBIN URINE: NEGATIVE
Hgb urine dipstick: NEGATIVE
KETONES UR: NEGATIVE
LEUKOCYTES UA: NEGATIVE
Nitrite: NEGATIVE
PH: 5.5 (ref 5.0–8.0)
RBC / HPF: NONE SEEN (ref 0–?)
Specific Gravity, Urine: 1.015 (ref 1.000–1.030)
Total Protein, Urine: NEGATIVE
UROBILINOGEN UA: 0.2 (ref 0.0–1.0)
Urine Glucose: NEGATIVE

## 2018-08-23 LAB — LIPID PANEL
CHOL/HDL RATIO: 6
Cholesterol: 283 mg/dL — ABNORMAL HIGH (ref 0–200)
HDL: 48.7 mg/dL (ref >39.00)
LDL Cholesterol: 204 mg/dL — ABNORMAL HIGH (ref 0–99)
NONHDL: 234.13
TRIGLYCERIDES: 149 mg/dL (ref 0.0–149.0)
VLDL: 29.8 mg/dL (ref 0.0–40.0)

## 2018-08-23 MED ORDER — ALPRAZOLAM 0.5 MG PO TABS: 0.5000 mg | ORAL_TABLET | Freq: Two times a day (BID) | ORAL | 1 refills | Status: DC | PRN

## 2018-08-23 MED ORDER — OMEPRAZOLE 40 MG PO CPDR: 40.0000 mg | DELAYED_RELEASE_CAPSULE | Freq: Every day | ORAL | 1 refills | Status: DC

## 2018-08-23 MED ORDER — TURMERIC 500 MG PO CAPS: 1.0000 | ORAL_CAPSULE | Freq: Every day | ORAL | 1 refills | Status: DC

## 2018-08-23 NOTE — Patient Instructions (Signed)

## 2018-08-23 NOTE — Progress Notes (Signed)
Subjective:  Patient ID: Paula Meyer, female    DOB: 11-20-1951  Age: 67 y.o. MRN: [id number]  CC: Hyperlipidemia; Hypertension; and Gastroesophageal Reflux   HPI Paula Meyer presents for f/up- she recently saw someone else and was treated for nausea by starting a PPI.  She tells me the nausea has resolved and she denies abdominal pain, odynophagia, or dysphagia.  She complains of large joint pain.  She is not comfortable taking an NSAID anymore.  She gets some symptom relief with tramadol.  She wants to try something other than tramadol for the joint pain.  She also complains of chronic, intermittent constipation for many years.  Outpatient Medications Prior to Visit  Medication Sig Dispense Refill  . albuterol (PROVENTIL HFA;VENTOLIN HFA) 108 (90 Base) MCG/ACT inhaler Inhale 2 puffs into the lungs every 4 (four) hours as needed for wheezing or shortness of breath (cough, shortness of breath or wheezing.). 1 Inhaler 1  . Azelastine HCl 0.15 % SOLN USE 1 SPRAY IN THE NOSE TWICE DAILY 30 mL 11  . diclofenac sodium (VOLTAREN) 1 % GEL Apply 4 g topically 4 (four) times daily. 100 g 0  . MIMVEY 1-0.5 MG tablet Take 1 tablet by mouth daily. prn  3  . traMADol (ULTRAM) 50 MG tablet Take 1 tablet (50 mg total) by mouth every 6 (six) hours as needed for severe pain. COntinue to use sparingly. 90 tablet 0  . DULoxetine (CYMBALTA) 30 MG capsule Take 1 capsule (30 mg total) by mouth daily. 30 capsule 0  . meloxicam (MOBIC) 15 MG tablet TAKE 1 TABLET BY MOUTH EVERY DAY 30 tablet 0  . omeprazole (PRILOSEC) 40 MG capsule Take 1 capsule (40 mg total) by mouth daily. 30 capsule 1  . Vitamin D, Ergocalciferol, (DRISDOL) 50000 units CAPS capsule Take 1 capsule (50,000 Units total) by mouth every 7 (seven) days. 12 capsule 0  . cholecalciferol (VITAMIN D) 1000 UNITS tablet Take 1,000 Units by mouth daily. Reported on 07/02/2016     No facility-administered medications prior to visit.     ROS Review  of Systems  Constitutional: Negative for appetite change, diaphoresis, fatigue and unexpected weight change.  HENT: Negative.  Negative for sinus pressure, trouble swallowing and voice change.   Eyes: Negative for visual disturbance.  Respiratory: Negative for apnea, cough, chest tightness, shortness of breath and wheezing.   Cardiovascular: Negative for chest pain, palpitations and leg swelling.  Gastrointestinal: Positive for constipation. Negative for abdominal pain, anal bleeding, blood in stool, diarrhea, nausea and vomiting.  Endocrine: Negative.   Genitourinary: Negative.  Negative for decreased urine volume, difficulty urinating, dysuria, frequency and urgency.  Musculoskeletal: Positive for arthralgias. Negative for back pain, myalgias and neck pain.  Skin: Negative.   Neurological: Negative for dizziness, weakness, light-headedness and headaches.  Hematological: Negative for adenopathy. Does not bruise/bleed easily.  Psychiatric/Behavioral: Negative for decreased concentration, dysphoric mood and sleep disturbance. The patient is nervous/anxious.        She complains of anxiety and panic and wants a refill of Xanax.    Objective:  BP (!) 160/102 (BP Location: Left Arm, Patient Position: Sitting, Cuff Size: Large)   Pulse 81   Temp 98.4 F (36.9 C) (Oral)   Resp 16   Ht 5' 4.37" (1.635 m)   Wt 209 lb (94.8 kg)   LMP 04/02/2011   SpO2 97%   BMI 35.46 kg/m   BP Readings from Last 3 Encounters:  08/23/18 (!) 160/102  08/04/18 130/90  06/27/18 140/90    Wt Readings from Last 3 Encounters:  08/23/18 209 lb (94.8 kg)  08/04/18 208 lb (94.3 kg)  06/27/18 206 lb (93.4 kg)    Physical Exam  Constitutional: She is oriented to person, place, and time. No distress.  HENT:  Mouth/Throat: Oropharynx is clear and moist. No oropharyngeal exudate.  Eyes: Conjunctivae are normal. No scleral icterus.  Neck: Normal range of motion. Neck supple. No JVD present. No thyromegaly  present.  Cardiovascular: Normal rate, regular rhythm and normal heart sounds. Exam reveals no gallop.  No murmur heard. Pulmonary/Chest: Effort normal and breath sounds normal. No respiratory distress. She has no wheezes. She has no rales.  Abdominal: Soft. Normal appearance and bowel sounds are normal. She exhibits no mass. There is no hepatosplenomegaly. There is no tenderness.  Musculoskeletal: Normal range of motion. She exhibits no edema, tenderness or deformity.  Lymphadenopathy:    She has no cervical adenopathy.  Neurological: She is alert and oriented to person, place, and time.  Skin: Skin is warm and dry. She is not diaphoretic. No pallor.  Psychiatric: Her behavior is normal. Judgment and thought content normal. Her mood appears anxious. Her speech is not rapid and/or pressured, not delayed and not tangential. She does not exhibit a depressed mood.  Vitals reviewed.   Lab Results  Component Value Date   WBC 3.4 (L) 06/27/2018   HGB 12.2 06/27/2018   HCT 37.1 06/27/2018   PLT 187.0 06/27/2018   GLUCOSE 80 08/23/2018   CHOL 283 (H) 08/23/2018   TRIG 149.0 08/23/2018   HDL 48.70 08/23/2018   LDLDIRECT 188.6 07/10/2013   LDLCALC 204 (H) 08/23/2018   ALT 21 06/27/2018   AST 16 06/27/2018   NA 140 08/23/2018   K 3.8 08/23/2018   CL 103 08/23/2018   CREATININE 0.87 08/23/2018   BUN 13 08/23/2018   CO2 30 08/23/2018   TSH 2.10 06/27/2018   HGBA1C 5.7 06/27/2018    Dg Abd Acute W/chest  Result Date: 11/09/2016 CLINICAL DATA:  Pain for 10 days.  Blood in stool EXAM: DG ABDOMEN ACUTE W/ 1V CHEST COMPARISON:  Abdominal radiographs May 04, 2007; chest radiograph September 02, 2016. FINDINGS: PA chest: Lungs are clear. Heart size and pulmonary vascularity are normal. No adenopathy. Supine and upright abdomen: There is moderate stool throughout the colon. There is no bowel dilatation or air-fluid level suggesting bowel obstruction. No free air. There are phleboliths pelvis.  IMPRESSION: No bowel obstruction.  No free air.  No lung edema or consolidation. Electronically Signed   By: Lowella Grip III M.D.   On: 11/09/2016 12:44    Assessment & Plan:   Bridgitt was seen today for hyperlipidemia, hypertension and gastroesophageal reflux.  Diagnoses and all orders for this visit:  Essential hypertension, benign- She has developed stage II hypertension.  Her potassium level is borderline low so I have asked her to start the combination of triamterene and hydrochlorothiazide.  I will also treat her vitamin D deficiency. -     Basic metabolic panel; Future -     Urinalysis, Routine w reflex microscopic; Future -     triamterene-hydrochlorothiazide (DYAZIDE) 37.5-25 MG capsule; Take 1 each (1 capsule total) by mouth daily.  GAD (generalized anxiety disorder) -     ALPRAZolam (XANAX) 0.5 MG tablet; Take 1 tablet (0.5 mg total) by mouth 2 (two) times daily as needed for anxiety.  Primary osteoarthritis involving multiple joints -     Turmeric 500 MG CAPS;  Take 1 capsule by mouth daily.  Morbid obesity ([place])- She is working on her lifestyle modifications to lose weight.  Hyperlipidemia with target LDL less than 130- She has an elevated ASCVD risk score so I have asked her to start taking a statin and aspirin for CV risk reduction. -     Lipid panel; Future -     rosuvastatin (CRESTOR) 40 MG tablet; Take 1 tablet (40 mg total) by mouth daily. -     aspirin EC 81 MG tablet; Take 1 tablet (81 mg total) by mouth daily.  Vitamin D deficiency disease -     VITAMIN D 25 Hydroxy (Vit-D Deficiency, Fractures); Future -     Vitamin D, Ergocalciferol, (DRISDOL) 50000 units CAPS capsule; Take 1 capsule (50,000 Units total) by mouth every 7 (seven) days.  Gastroesophageal reflux disease with esophagitis- Improvement noted -     omeprazole (PRILOSEC) 40 MG capsule; Take 1 capsule (40 mg total) by mouth daily.  Nausea  Gastroesophageal reflux disease, esophagitis presence  not specified  Need for pneumococcal vaccination -     Pneumococcal conjugate vaccine 13-valent  Chronic idiopathic constipation -     linaclotide (LINZESS) 145 MCG CAPS capsule; Take 1 capsule (145 mcg total) by mouth daily before breakfast.   I have discontinued Erin Hearing. Grondin's cholecalciferol, meloxicam, and DULoxetine. I am also having her start on Turmeric, ALPRAZolam, linaclotide, rosuvastatin, triamterene-hydrochlorothiazide, and aspirin EC. Additionally, I am having her maintain her MIMVEY, albuterol, Azelastine HCl, diclofenac sodium, traMADol, omeprazole, and Vitamin D (Ergocalciferol).  Meds ordered this encounter  Medications  . Turmeric 500 MG CAPS    Sig: Take 1 capsule by mouth daily.    Dispense:  90 capsule    Refill:  1  . ALPRAZolam (XANAX) 0.5 MG tablet    Sig: Take 1 tablet (0.5 mg total) by mouth 2 (two) times daily as needed for anxiety.    Dispense:  180 tablet    Refill:  1  . omeprazole (PRILOSEC) 40 MG capsule    Sig: Take 1 capsule (40 mg total) by mouth daily.    Dispense:  90 capsule    Refill:  1  . linaclotide (LINZESS) 145 MCG CAPS capsule    Sig: Take 1 capsule (145 mcg total) by mouth daily before breakfast.    Dispense:  90 capsule    Refill:  1  . Vitamin D, Ergocalciferol, (DRISDOL) 50000 units CAPS capsule    Sig: Take 1 capsule (50,000 Units total) by mouth every 7 (seven) days.    Dispense:  12 capsule    Refill:  1  . rosuvastatin (CRESTOR) 40 MG tablet    Sig: Take 1 tablet (40 mg total) by mouth daily.    Dispense:  90 tablet    Refill:  1  . triamterene-hydrochlorothiazide (DYAZIDE) 37.5-25 MG capsule    Sig: Take 1 each (1 capsule total) by mouth daily.    Dispense:  90 capsule    Refill:  0  . aspirin EC 81 MG tablet    Sig: Take 1 tablet (81 mg total) by mouth daily.    Dispense:  90 tablet    Refill:  1     Follow-up: Return in about 2 months (around 10/23/2018).  Scarlette Calico, MD

## 2018-08-24 ENCOUNTER — Encounter: Payer: Self-pay | Admitting: Internal Medicine

## 2018-08-24 DIAGNOSIS — K5900 Constipation, unspecified: Secondary | ICD-10-CM | POA: Insufficient documentation

## 2018-08-24 LAB — VITAMIN D 25 HYDROXY (VIT D DEFICIENCY, FRACTURES): VITD: 22.28 ng/mL — ABNORMAL LOW (ref 30.00–100.00)

## 2018-08-24 MED ORDER — LINACLOTIDE 145 MCG PO CAPS: 145.0000 ug | ORAL_CAPSULE | Freq: Every day | ORAL | 1 refills | Status: DC

## 2018-08-24 MED ORDER — ASPIRIN EC 81 MG PO TBEC: 81.0000 mg | DELAYED_RELEASE_TABLET | Freq: Every day | ORAL | 1 refills | Status: DC

## 2018-08-24 MED ORDER — TRIAMTERENE-HCTZ 37.5-25 MG PO CAPS: 1.0000 | ORAL_CAPSULE | Freq: Every day | ORAL | 0 refills | Status: DC

## 2018-08-24 MED ORDER — ROSUVASTATIN CALCIUM 40 MG PO TABS: 40.0000 mg | ORAL_TABLET | Freq: Every day | ORAL | 1 refills | Status: DC

## 2018-08-24 MED ORDER — VITAMIN D (ERGOCALCIFEROL) 1.25 MG (50000 UNIT) PO CAPS: 50000.0000 [IU] | ORAL_CAPSULE | ORAL | 1 refills | Status: DC

## 2018-09-14 ENCOUNTER — Telehealth: Payer: Self-pay | Admitting: Internal Medicine

## 2018-09-14 NOTE — Telephone Encounter (Signed)
Copied from [place] [id number]. Topic: General - Other >> Sep 14, 2018  9:53 AM Carolyn Stare wrote:  Pt said she think the following med is causing her potassium to be low cause she she has no energy. Would like a call back   triamterene-hydrochlorothiazide (DYAZIDE) 37.5-25 MG capsule

## 2018-09-15 NOTE — Telephone Encounter (Signed)
Patient called back to get the status of her medication problem that's causing her potassium to be low.  Patient states that she has not heard back from the doctor or nurse.  Please call patient back as soon as possible.  CB# [phone].

## 2018-09-16 NOTE — Telephone Encounter (Signed)
Pt is concerned that the diuretic in BP meds is causing her K+ low.  She is feeling fatigued and is having some palpitations/flutters like she had the last time her K+ was low. Pt states that she does not have any CP, SOB, or DOE. Pt is not currently taking an rx for K+.   Pt has not taken BP meds since Tuesday and she took BP while on the phone with me and it was 142/83.  Pt has been scheduled with Mickel Baas on Monday at 10am.

## 2018-09-19 ENCOUNTER — Ambulatory Visit (INDEPENDENT_AMBULATORY_CARE_PROVIDER_SITE_OTHER): Payer: Medicare Other | Admitting: Family

## 2018-09-19 ENCOUNTER — Other Ambulatory Visit (INDEPENDENT_AMBULATORY_CARE_PROVIDER_SITE_OTHER): Payer: Medicare Other

## 2018-09-19 ENCOUNTER — Encounter: Payer: Self-pay | Admitting: Family

## 2018-09-19 ENCOUNTER — Other Ambulatory Visit: Payer: Self-pay | Admitting: Family

## 2018-09-19 VITALS — BP 150/84 | HR 78 | Temp 98.4°F | Ht 64.37 in | Wt 207.1 lb

## 2018-09-19 DIAGNOSIS — E559 Vitamin D deficiency, unspecified: Secondary | ICD-10-CM

## 2018-09-19 DIAGNOSIS — R5383 Other fatigue: Secondary | ICD-10-CM

## 2018-09-19 DIAGNOSIS — I10 Essential (primary) hypertension: Secondary | ICD-10-CM | POA: Diagnosis not present

## 2018-09-19 LAB — COMPREHENSIVE METABOLIC PANEL
ALT: 14 U/L (ref 0–35)
AST: 14 U/L (ref 0–37)
Albumin: 4.1 g/dL (ref 3.5–5.2)
Alkaline Phosphatase: 62 U/L (ref 39–117)
BUN: 12 mg/dL (ref 6–23)
CO2: 32 meq/L (ref 19–32)
Calcium: 9.4 mg/dL (ref 8.4–10.5)
Chloride: 104 mEq/L (ref 96–112)
Creatinine, Ser: 0.96 mg/dL (ref 0.40–1.20)
GFR: 74.98 mL/min (ref >60.00)
GLUCOSE: 103 mg/dL — AB (ref 70–99)
POTASSIUM: 3.6 meq/L (ref 3.5–5.1)
Sodium: 142 mEq/L (ref 135–145)
Total Bilirubin: 0.3 mg/dL (ref 0.2–1.2)
Total Protein: 7.4 g/dL (ref 6.0–8.3)

## 2018-09-19 LAB — VITAMIN D 25 HYDROXY (VIT D DEFICIENCY, FRACTURES): VITD: 24.57 ng/mL — ABNORMAL LOW (ref 30.00–100.00)

## 2018-09-19 LAB — VITAMIN B12: VITAMIN B 12: 453 pg/mL (ref 211–911)

## 2018-09-19 MED ORDER — TELMISARTAN-HCTZ 40-12.5 MG PO TABS: 1.0000 | ORAL_TABLET | Freq: Every day | ORAL | 1 refills | Status: DC

## 2018-09-19 NOTE — Progress Notes (Signed)
Paula Meyer is a 67 y.o. female with the following history as recorded in EpicCare:  Patient Active Problem List   Diagnosis Date Noted  . Constipation 08/24/2018  . Vitamin D deficiency disease 08/23/2018  . Morbid obesity ([place]) 03/17/2018  . Seasonal affective disorder ([place]) 11/03/2017  . GAD (generalized anxiety disorder) 11/03/2017  . Other screening mammogram 07/10/2013  . B12 deficiency anemia 08/24/2012  . GERD (gastroesophageal reflux disease) 06/30/2012  . Eczema 05/04/2012  . DDD (degenerative disc disease), lumbar 04/02/2011  . Hyperlipidemia with target LDL less than 130 10/20/2010  . Depression with anxiety 10/20/2010  . Essential hypertension, benign 10/20/2010  . ALLERGIC RHINITIS 10/20/2010  . Mild persistent asthma 10/20/2010  . Osteoarthritis 10/20/2010    Current Outpatient Medications  Medication Sig Dispense Refill  . albuterol (PROVENTIL HFA;VENTOLIN HFA) 108 (90 Base) MCG/ACT inhaler Inhale 2 puffs into the lungs every 4 (four) hours as needed for wheezing or shortness of breath (cough, shortness of breath or wheezing.). 1 Inhaler 1  . ALPRAZolam (XANAX) 0.5 MG tablet Take 1 tablet (0.5 mg total) by mouth 2 (two) times daily as needed for anxiety. 180 tablet 1  . aspirin EC 81 MG tablet Take 1 tablet (81 mg total) by mouth daily. 90 tablet 1  . Azelastine HCl 0.15 % SOLN USE 1 SPRAY IN THE NOSE TWICE DAILY 30 mL 11  . diclofenac sodium (VOLTAREN) 1 % GEL Apply 4 g topically 4 (four) times daily. 100 g 0  . linaclotide (LINZESS) 145 MCG CAPS capsule Take 1 capsule (145 mcg total) by mouth daily before breakfast. 90 capsule 1  . MIMVEY 1-0.5 MG tablet Take 1 tablet by mouth daily. prn  3  . omeprazole (PRILOSEC) 40 MG capsule Take 1 capsule (40 mg total) by mouth daily. 90 capsule 1  . rosuvastatin (CRESTOR) 40 MG tablet Take 1 tablet (40 mg total) by mouth daily. 90 tablet 1  . traMADol (ULTRAM) 50 MG tablet Take 1 tablet (50 mg total) by mouth every 6 (six)  hours as needed for severe pain. COntinue to use sparingly. 90 tablet 0  . Turmeric 500 MG CAPS Take 1 capsule by mouth daily. 90 capsule 1  . Vitamin D, Ergocalciferol, (DRISDOL) 50000 units CAPS capsule Take 1 capsule (50,000 Units total) by mouth every 7 (seven) days. 12 capsule 1  . telmisartan-hydrochlorothiazide (MICARDIS HCT) 40-12.5 MG tablet Take 1 tablet by mouth daily. 30 tablet 1   No current facility-administered medications for this visit.     Allergies: Paxil [paroxetine hcl]; Bupropion hcl; Contrast media [iodinated diagnostic agents]; Lisinopril; and Tetracycline  Past Medical History:  Diagnosis Date  . Allergy   . Anxiety   . Asthma   . Depression   . Hx of colonic polyps   . Hyperlipidemia   . Hypertension   . Osteoarthritis     Past Surgical History:  Procedure Laterality Date  . TUBAL LIGATION      Family History  Problem Relation Age of Onset  . Hyperlipidemia Brother   . Hypertension Brother   . Arthritis Other   . Depression Other   . Colon cancer Neg Hx   . Stomach cancer Neg Hx     Social History   Tobacco Use  . Smoking status: Former Research scientist (life sciences)  . Smokeless tobacco: Never Used  Substance Use Topics  . Alcohol use: Yes    Alcohol/week: 3.0 standard drinks    Types: 3 Glasses of wine per week  Comment: rarely    Subjective:  Patient presents with concerns for elevated blood pressure; was seen by her PCP last month and started on Triam/ HCTZ; is concerned that her potassium is too low on this medication because she has been feeling so tired; has started herself on OTC potassium and felt somewhat better; she opted to hold her blood pressure medication for a few days but re-started this past weekend when she began to feel a "nagging headache" and noticed that her blood pressure was elevated; has not taken her blood pressure medication this morning; has a history of hypertension and has failed multiple medications due to side effects including  Bystolic, Lisinopril and possibly HCTZ; patient was able to stop medication for hypertension with weight loss; however, has put back on 11 pounds in the past year;   Objective:  Vitals:   09/19/18 1012  BP: (!) 150/84  Pulse: 78  Temp: 98.4 F (36.9 C)  TempSrc: Oral  SpO2: 99%  Weight: 207 lb 1.9 oz (93.9 kg)  Height: 5' 4.37" (1.635 m)    General: Well developed, well nourished, in no acute distress  Skin : Warm and dry.  Head: Normocephalic and atraumatic  Eyes: Sclera and conjunctiva clear; pupils round and reactive to light; extraocular movements intact  Ears: External normal; canals clear; tympanic membranes normal  Oropharynx: Pink, supple. No suspicious lesions  Neck: Supple without thyromegaly, adenopathy  Lungs: Respirations unlabored; clear to auscultation bilaterally without wheeze, rales, rhonchi  CVS exam: normal rate and regular rhythm.  Neurologic: Alert and oriented; speech intact; face symmetrical; moves all extremities well; CNII-XII intact without focal deficit   Assessment:  1. Essential hypertension, benign   2. Vitamin D deficiency disease   3. Other fatigue     Plan:  1. Patient will stop Triam/ HCTZ- does not feel well on this medication; update labs today; may consider trial of ARB/ HCTZ instead; follow-up with her PCP in 1 month; 2. & 3. Update labs as requested;  Spent 30 minutes with patient; greater than 50% spent in counseling;    No follow-ups on file.  Orders Placed This Encounter  Procedures  . Comp Met (CMET)    Standing Status:   Future    Number of Occurrences:   1    Standing Expiration Date:   09/19/2019  . Vitamin D (25 hydroxy)    Standing Status:   Future    Number of Occurrences:   1    Standing Expiration Date:   09/19/2019  . B12    Standing Status:   Future    Number of Occurrences:   1    Standing Expiration Date:   09/19/2019    Requested Prescriptions    No prescriptions requested or ordered in this encounter

## 2018-09-27 ENCOUNTER — Telehealth: Payer: Self-pay | Admitting: Internal Medicine

## 2018-09-27 NOTE — Telephone Encounter (Signed)
Crestor does not affect creatinine and glucose

## 2018-09-27 NOTE — Telephone Encounter (Signed)
Copied from [place] [id number]. Topic: Quick Communication - See Telephone Encounter >> Sep 27, 2018 11:25 AM Berneta Levins wrote: CRM for notification. See Telephone encounter for: 09/27/18.  Pt would like to speak with Dr. Ronnald Ramp nurse regarding rosuvastatin (CRESTOR) 40 MG tablet.  States she is concerned it is having negative impact on her creatine and glucose. Pt can be reached at [phone]

## 2018-09-27 NOTE — Telephone Encounter (Signed)
Spoke to pt and she is concerned with her creatine and glucose. She is concerned that the crestor is making those labs worse and wanted to know if she could reduce the crestor and add a garlic supplement to reduce cholesterol.

## 2018-09-28 ENCOUNTER — Other Ambulatory Visit: Payer: Self-pay | Admitting: Internal Medicine

## 2018-09-28 NOTE — Telephone Encounter (Signed)
Pt informed of MD response and stated understanding. Pt has an appt coming up on 10/24/2018.

## 2018-10-24 ENCOUNTER — Other Ambulatory Visit (INDEPENDENT_AMBULATORY_CARE_PROVIDER_SITE_OTHER): Payer: Medicare Other

## 2018-10-24 ENCOUNTER — Ambulatory Visit (INDEPENDENT_AMBULATORY_CARE_PROVIDER_SITE_OTHER): Payer: Medicare Other | Admitting: Internal Medicine

## 2018-10-24 ENCOUNTER — Encounter: Payer: Self-pay | Admitting: Internal Medicine

## 2018-10-24 VITALS — BP 152/94 | HR 74 | Temp 99.1°F | Resp 16 | Ht 64.37 in | Wt 210.0 lb

## 2018-10-24 DIAGNOSIS — I10 Essential (primary) hypertension: Secondary | ICD-10-CM

## 2018-10-24 DIAGNOSIS — R110 Nausea: Secondary | ICD-10-CM | POA: Insufficient documentation

## 2018-10-24 DIAGNOSIS — E559 Vitamin D deficiency, unspecified: Secondary | ICD-10-CM

## 2018-10-24 DIAGNOSIS — G8929 Other chronic pain: Secondary | ICD-10-CM | POA: Diagnosis not present

## 2018-10-24 DIAGNOSIS — M25511 Pain in right shoulder: Secondary | ICD-10-CM

## 2018-10-24 LAB — VITAMIN D 25 HYDROXY (VIT D DEFICIENCY, FRACTURES): VITD: 23.4 ng/mL — ABNORMAL LOW (ref 30.00–100.00)

## 2018-10-24 LAB — BASIC METABOLIC PANEL
BUN: 14 mg/dL (ref 6–23)
CO2: 31 mEq/L (ref 19–32)
CREATININE: 0.95 mg/dL (ref 0.40–1.20)
Calcium: 9.3 mg/dL (ref 8.4–10.5)
Chloride: 104 mEq/L (ref 96–112)
GFR: 75.87 mL/min (ref >60.00)
GLUCOSE: 99 mg/dL (ref 70–99)
Potassium: 3.5 mEq/L (ref 3.5–5.1)
Sodium: 141 mEq/L (ref 135–145)

## 2018-10-24 MED ORDER — NEBIVOLOL HCL 10 MG PO TABS: 10.0000 mg | ORAL_TABLET | Freq: Every day | ORAL | 0 refills | Status: DC

## 2018-10-24 NOTE — Progress Notes (Signed)
Subjective:  Patient ID: Paula Meyer, female    DOB: [birth year]  Age: 67 y.o. MRN: [id number]  CC: Hypertension   HPI Paula Meyer presents for f/up - She complains of chronic, recurrent episodes of right shoulder pain.  She denies any recent trauma or injury.  This has been going on for several months.  She gets moderate symptom relief with topical diclofenac gel and tramadol.  She also complains that her blood pressure is not well controlled.  She denies any recent episodes of headache, blurred vision, CP, DOE, palpitations, edema, or fatigue.  She tells me she is compliant with the vitamin D supplement and the current doses of an ARB and thiazide diuretic.  She also complains of recurrent, intermittent episodes of nausea without vomiting.  Her last episode of nausea was a week ago after she ate a soup that had a bunch of onions in it.  She complains of weight gain and denies abdominal pain, diarrhea, or constipation.  Outpatient Medications Prior to Visit  Medication Sig Dispense Refill  . albuterol (PROVENTIL HFA;VENTOLIN HFA) 108 (90 Base) MCG/ACT inhaler Inhale 2 puffs into the lungs every 4 (four) hours as needed for wheezing or shortness of breath (cough, shortness of breath or wheezing.). 1 Inhaler 1  . ALPRAZolam (XANAX) 0.5 MG tablet Take 1 tablet (0.5 mg total) by mouth 2 (two) times daily as needed for anxiety. 180 tablet 1  . aspirin EC 81 MG tablet Take 1 tablet (81 mg total) by mouth daily. 90 tablet 1  . Azelastine HCl 0.15 % SOLN USE 1 SPRAY IN THE NOSE TWICE DAILY 90 mL 1  . diclofenac sodium (VOLTAREN) 1 % GEL Apply 4 g topically 4 (four) times daily. 100 g 0  . linaclotide (LINZESS) 145 MCG CAPS capsule Take 1 capsule (145 mcg total) by mouth daily before breakfast. 90 capsule 1  . MIMVEY 1-0.5 MG tablet Take 1 tablet by mouth daily. prn  3  . omeprazole (PRILOSEC) 40 MG capsule Take 1 capsule (40 mg total) by mouth daily. 90 capsule 1  .  telmisartan-hydrochlorothiazide (MICARDIS HCT) 40-12.5 MG tablet Take 1 tablet by mouth daily. 30 tablet 1  . traMADol (ULTRAM) 50 MG tablet Take 1 tablet (50 mg total) by mouth every 6 (six) hours as needed for severe pain. COntinue to use sparingly. 90 tablet 0  . Turmeric 500 MG CAPS Take 1 capsule by mouth daily. 90 capsule 1  . Vitamin D, Ergocalciferol, (DRISDOL) 50000 units CAPS capsule Take 1 capsule (50,000 Units total) by mouth every 7 (seven) days. 12 capsule 1  . rosuvastatin (CRESTOR) 40 MG tablet Take 1 tablet (40 mg total) by mouth daily. (Patient not taking: Reported on 10/24/2018) 90 tablet 1   No facility-administered medications prior to visit.     ROS Review of Systems  Constitutional: Positive for unexpected weight change (wt gain). Negative for diaphoresis and fatigue.  HENT: Negative.  Negative for trouble swallowing.   Gastrointestinal: Positive for nausea. Negative for abdominal pain, anal bleeding, constipation, diarrhea, rectal pain and vomiting.  Genitourinary: Negative.  Negative for difficulty urinating.  Musculoskeletal: Positive for arthralgias. Negative for back pain.  Skin: Negative.  Negative for color change, pallor and rash.  Neurological: Negative for dizziness, weakness, light-headedness and headaches.  Hematological: Negative for adenopathy. Does not bruise/bleed easily.  Psychiatric/Behavioral: Negative.     Objective:  BP (!) 152/94 (BP Location: Left Arm, Patient Position: Sitting, Cuff Size: Normal)   Pulse 74  Temp 99.1 F (37.3 C) (Oral)   Resp 16   Ht 5' 4.37" (1.635 m)   Wt 210 lb (95.3 kg)   LMP 04/02/2011   SpO2 97%   BMI 35.63 kg/m   BP Readings from Last 3 Encounters:  10/24/18 (!) 152/94  09/19/18 (!) 150/84  08/23/18 (!) 160/102    Wt Readings from Last 3 Encounters:  10/24/18 210 lb (95.3 kg)  09/19/18 207 lb 1.9 oz (93.9 kg)  08/23/18 209 lb (94.8 kg)    Physical Exam  Constitutional: She is oriented to person,  place, and time. No distress.  HENT:  Mouth/Throat: Oropharynx is clear and moist. No oropharyngeal exudate.  Eyes: Conjunctivae are normal. No scleral icterus.  Neck: Normal range of motion. Neck supple. No JVD present. No thyromegaly present.  Cardiovascular: Normal rate, regular rhythm and normal heart sounds. Exam reveals no gallop.  No murmur heard. Pulmonary/Chest: Effort normal and breath sounds normal. No respiratory distress. She has no wheezes. She has no rales.  Abdominal: Soft. Bowel sounds are normal. She exhibits no mass. There is no hepatosplenomegaly. There is no tenderness.  Musculoskeletal: She exhibits no edema, tenderness or deformity.       Right shoulder: She exhibits decreased range of motion. She exhibits no tenderness, no bony tenderness, no swelling, no effusion and no deformity.  Lymphadenopathy:    She has no cervical adenopathy.  Neurological: She is alert and oriented to person, place, and time.  Skin: Skin is warm and dry. She is not diaphoretic. No pallor.  Vitals reviewed.   Lab Results  Component Value Date   WBC 3.4 (L) 06/27/2018   HGB 12.2 06/27/2018   HCT 37.1 06/27/2018   PLT 187.0 06/27/2018   GLUCOSE 99 10/24/2018   CHOL 283 (H) 08/23/2018   TRIG 149.0 08/23/2018   HDL 48.70 08/23/2018   LDLDIRECT 188.6 07/10/2013   LDLCALC 204 (H) 08/23/2018   ALT 14 09/19/2018   AST 14 09/19/2018   NA 141 10/24/2018   K 3.5 10/24/2018   CL 104 10/24/2018   CREATININE 0.95 10/24/2018   BUN 14 10/24/2018   CO2 31 10/24/2018   TSH 2.10 06/27/2018   HGBA1C 5.7 06/27/2018    Dg Abd Acute W/chest  Result Date: 11/09/2016 CLINICAL DATA:  Pain for 10 days.  Blood in stool EXAM: DG ABDOMEN ACUTE W/ 1V CHEST COMPARISON:  Abdominal radiographs May 04, 2007; chest radiograph September 02, 2016. FINDINGS: PA chest: Lungs are clear. Heart size and pulmonary vascularity are normal. No adenopathy. Supine and upright abdomen: There is moderate stool throughout  the colon. There is no bowel dilatation or air-fluid level suggesting bowel obstruction. No free air. There are phleboliths pelvis. IMPRESSION: No bowel obstruction.  No free air.  No lung edema or consolidation. Electronically Signed   By: Lowella Grip III M.D.   On: 11/09/2016 12:44    Assessment & Plan:   Haylynn was seen today for hypertension.  Diagnoses and all orders for this visit:  Vitamin D deficiency disease- Her vitamin D level is low.  She tells me she is compliant with the vitamin D supplement and takes it once a week with ice cream.  Will see if GI is concerned about malabsorption. -     VITAMIN D 25 Hydroxy (Vit-D Deficiency, Fractures); Future  Essential hypertension, benign- Her blood pressure is not adequately well controlled.  I have asked her to add nebivolol to the current regimen of an ARB and thiazide diuretic. -  nebivolol (BYSTOLIC) 10 MG tablet; Take 1 tablet (10 mg total) by mouth daily. -     Basic metabolic panel; Future  Nausea in adult- I have asked her to see GI about this to see if she needs to undergo upper endoscopy. -     Ambulatory referral to Gastroenterology  Chronic right shoulder pain- I think she has a rotator cuff issue.  She will see sports medicine for an evaluation of this. -     Ambulatory referral to Sports Medicine   I have discontinued Erisa Mehlman. [place]'s rosuvastatin. I am also having her start on nebivolol. Additionally, I am having her maintain her MIMVEY, albuterol, diclofenac sodium, traMADol, Turmeric, ALPRAZolam, omeprazole, linaclotide, Vitamin D (Ergocalciferol), aspirin EC, telmisartan-hydrochlorothiazide, and Azelastine HCl.  Meds ordered this encounter  Medications  . nebivolol (BYSTOLIC) 10 MG tablet    Sig: Take 1 tablet (10 mg total) by mouth daily.    Dispense:  84 tablet    Refill:  0     Follow-up: Return in about 3 months (around 01/24/2019).  Scarlette Calico, MD

## 2018-10-24 NOTE — Patient Instructions (Signed)

## 2018-10-29 ENCOUNTER — Ambulatory Visit (INDEPENDENT_AMBULATORY_CARE_PROVIDER_SITE_OTHER): Payer: Medicare Other | Admitting: Family Medicine

## 2018-10-29 ENCOUNTER — Other Ambulatory Visit: Payer: Self-pay

## 2018-10-29 ENCOUNTER — Encounter: Payer: Self-pay | Admitting: Family Medicine

## 2018-10-29 VITALS — BP 138/78 | HR 55 | Temp 98.8°F | Resp 14 | Ht 65.0 in | Wt 212.4 lb

## 2018-10-29 DIAGNOSIS — I10 Essential (primary) hypertension: Secondary | ICD-10-CM

## 2018-10-29 DIAGNOSIS — E559 Vitamin D deficiency, unspecified: Secondary | ICD-10-CM | POA: Diagnosis not present

## 2018-10-29 DIAGNOSIS — K219 Gastro-esophageal reflux disease without esophagitis: Secondary | ICD-10-CM | POA: Diagnosis not present

## 2018-10-29 DIAGNOSIS — E785 Hyperlipidemia, unspecified: Secondary | ICD-10-CM

## 2018-10-29 MED ORDER — ROSUVASTATIN CALCIUM 10 MG PO TABS: 10.0000 mg | ORAL_TABLET | Freq: Every day | ORAL | 0 refills | Status: DC

## 2018-10-29 NOTE — Progress Notes (Signed)
11/16/201911:51 AM  Paula Meyer 1951/09/24, 67 y.o. female [id number]  Chief Complaint  Patient presents with  . Establish Care    has been on omprazole since july and still having the same symptoms, vit d low on rx D2    HPI:   Patient is a 67 y.o. female with past medical history significant for HTN, asthma, OA, HLP, GERD, vitamin D deficiency, anxiety who presents today to establish care  Previous PCP Dr Scarlette Calico Last OV Oct 24 2018  AXR - constipation Added bystolic 10mg  Referred to GI for nausea Referring to sports medicine for chronic shoulder pain  Patient stopped crestor - as she felt it was affecting her memory and worried about her glucose  BMP normal Vitamin D 23.40, has been on high dose vitamin d since her level was less than 7 She was feeling really well when her vitamin d 24.57 She is wondering about changing to vitamin d3  Her main concern is nausea  She was started on PPI in July She was on herbalife and 2 weeks in she started to notice she was naseous all the time She noticed that if she ate it was ok but if her stomach was empty it was bad For a while PPI was working had no nausea But then about 2 weeks ago, started having nausea again Has not been taking PPI at night and waking up with a mild achy feeling epigastric pain, took tramadol this morning helped  Lab Results  Component Value Date   LDLCALC 204 (H) 08/23/2018    Fall Risk  10/29/2018 02/23/2018 01/12/2018 09/17/2017 03/13/2017  Falls in the past year? 0 No No No No     Depression screen [facility] 2/9 10/29/2018 10/24/2018 06/27/2018  Decreased Interest 1 1 2   Down, Depressed, Hopeless 0 0 2  PHQ - 2 Score 1 1 4   Altered sleeping 0 0 3  Tired, decreased energy 3 2 3   Change in appetite 2 1 2   Feeling bad or failure about yourself  0 1 2  Trouble concentrating 0 0 1  Moving slowly or fidgety/restless 0 0 0  Suicidal thoughts 0 0 2  PHQ-9 Score 6 5 17   Difficult doing work/chores  Not difficult at all Somewhat difficult -    Allergies  Allergen Reactions  . Paxil [Paroxetine Hcl]     Makes her grind her teeth  . Bupropion Hcl Other (See Comments)    palpitations  . Contrast Media [Iodinated Diagnostic Agents] Hives and Itching  . Lisinopril     cough  . Tetracycline Nausea Only    Prior to Admission medications   Medication Sig Start Date End Date Taking? Authorizing Provider  albuterol (PROVENTIL HFA;VENTOLIN HFA) 108 (90 Base) MCG/ACT inhaler Inhale 2 puffs into the lungs every 4 (four) hours as needed for wheezing or shortness of breath (cough, shortness of breath or wheezing.). 09/02/16   English, Colletta [place] D, PA  ALPRAZolam Duanne Moron) 0.5 MG tablet Take 1 tablet (0.5 mg total) by mouth 2 (two) times daily as needed for anxiety. 08/23/18   [name], MD  aspirin EC 81 MG tablet Take 1 tablet (81 mg total) by mouth daily. 08/24/18   [name], MD  Azelastine HCl 0.15 % SOLN USE 1 SPRAY IN THE NOSE TWICE DAILY 09/28/18   [name], MD  diclofenac sodium (VOLTAREN) 1 % GEL Apply 4 g topically 4 (four) times daily. 02/23/18   Tereasa Coop, PA-C  MIMVEY 1-0.5 MG tablet Take 1 tablet by mouth daily. prn 06/17/16   [provider]  nebivolol (BYSTOLIC) 10 MG tablet Take 1 tablet (10 mg total) by mouth daily. 10/24/18   [name], MD  omeprazole (PRILOSEC) 40 MG capsule Take 1 capsule (40 mg total) by mouth daily. 08/23/18   [name], MD  telmisartan-hydrochlorothiazide (MICARDIS HCT) 40-12.5 MG tablet Take 1 tablet by mouth daily. 09/19/18   Marrian Salvage, FNP  traMADol (ULTRAM) 50 MG tablet Take 1 tablet (50 mg total) by mouth every 6 (six) hours as needed for severe pain. COntinue to use sparingly. 02/23/18   Tereasa Coop, PA-C  Turmeric 500 MG CAPS Take 1 capsule by mouth daily. 08/23/18   [name], MD  Vitamin D, Ergocalciferol, (DRISDOL) 50000 units CAPS capsule Take 1 capsule (50,000 Units total) by mouth every  7 (seven) days. 08/24/18   [name], MD    Past Medical History:  Diagnosis Date  . Allergy   . Anxiety   . Asthma   . Depression   . Hx of colonic polyps   . Hyperlipidemia   . Hypertension   . Osteoarthritis     Past Surgical History:  Procedure Laterality Date  . TUBAL LIGATION      Social History   Tobacco Use  . Smoking status: Former Research scientist (life sciences)  . Smokeless tobacco: Never Used  Substance Use Topics  . Alcohol use: Yes    Alcohol/week: 3.0 standard drinks    Types: 3 Glasses of wine per week    Comment: rarely    Family History  Problem Relation Age of Onset  . Hyperlipidemia Brother   . Hypertension Brother   . Arthritis Other   . Depression Other   . Colon cancer Neg Hx   . Stomach cancer Neg Hx     Review of Systems  Constitutional: Positive for malaise/fatigue. Negative for chills and fever.  Respiratory: Negative for cough and shortness of breath.   Cardiovascular: Negative for chest pain, palpitations and leg swelling.  Gastrointestinal: Positive for abdominal pain, heartburn and nausea. Negative for constipation, diarrhea and vomiting.  Musculoskeletal: Positive for joint pain.  All other systems reviewed and are negative.  Per hpi  OBJECTIVE:  Blood pressure 138/78, pulse (!) 55, temperature 98.8 F (37.1 C), temperature source Oral, resp. rate 14, height 5\' 5"  (1.651 m), weight 212 lb 6.4 oz (96.3 kg), last menstrual period 04/02/2011, SpO2 97 %. Body mass index is 35.35 kg/m.   Wt Readings from Last 3 Encounters:  10/29/18 212 lb 6.4 oz (96.3 kg)  10/24/18 210 lb (95.3 kg)  09/19/18 207 lb 1.9 oz (93.9 kg)    Physical Exam  Constitutional: She is oriented to person, place, and time. She appears well-developed and well-nourished.  HENT:  Head: Normocephalic and atraumatic.  Mouth/Throat: Oropharynx is clear and moist. No oropharyngeal exudate.  Eyes: Pupils are equal, round, and reactive to light. Conjunctivae and EOM are normal.  No scleral icterus.  Neck: Neck supple.  Cardiovascular: Normal rate, regular rhythm and normal heart sounds. Exam reveals no gallop and no friction rub.  No murmur heard. Pulmonary/Chest: Effort normal and breath sounds normal. She has no wheezes. She has no rales.  Abdominal: Soft. Bowel sounds are normal. She exhibits no distension and no mass. There is no tenderness.  Musculoskeletal: She exhibits no edema.  Neurological: She is alert and oriented to person, place, and time.  Skin: Skin is warm  and dry.  Psychiatric: She has a normal mood and affect.  Nursing note and vitals reviewed.   ASSESSMENT and PLAN  1. Essential hypertension, benign Controlled. Continue current regime. Discussed regular exercise and healthy weight.   2. Gastroesophageal reflux disease, esophagitis presence not specified Cont with PPI, make sure to not skip, can use mylanta for prn sx. Make appt with GI for further eval.  Discussed mgt of constipation as can be contributing to intermittent nausea.    3. Vitamin D deficiency disease Start OTC vitamin d3 2000 units a day.  - Vitamin D, 25-hydroxy; Future  4. Hyperlipidemia with target LDL less than 130 Discussed patient ASCVD risk and concerns re side effects. Will restart crestor at lower dose.  - Lipid panel; Future - Comprehensive metabolic panel; Future  Other orders - rosuvastatin (CRESTOR) 10 MG tablet; Take 1 tablet (10 mg total) by mouth daily.  Return in about 6 weeks (around 12/10/2018) for routine medical condiitions. labs to be done 2-3 days prior to appt    Rutherford Guys, MD [facility] [place], [place] [address] Ph.  [phone] Fax [phone]

## 2018-10-29 NOTE — Patient Instructions (Addendum)
  Continue with ompeazole twice a day, when you have a flare up, please take mylanta  Start crestor 10mg  once a day  Please make appointment with Gastroenterology  Start over the counter vitamin d3 2000 units a day  If you have lab work done today you will be contacted with your lab results within the next 2 weeks.  If you have not heard from [place] then please contact us. The fastest way to get your results is to register for My Chart.   IF you received an x-ray today, you will receive an invoice from [facility]. Please contact [facility] at [phone] with questions or concerns regarding your invoice.   IF you received labwork today, you will receive an invoice from [facility]. Please contact [facility] at [phone] with questions or concerns regarding your invoice.   Our billing staff will not be able to assist you with questions regarding bills from these companies.  You will be contacted with the lab results as soon as they are available. The fastest way to get your results is to activate your My Chart account. Instructions are located on the last page of this paperwork. If you have not heard from [place] regarding the results in 2 weeks, please contact this office.

## 2018-11-02 ENCOUNTER — Ambulatory Visit (INDEPENDENT_AMBULATORY_CARE_PROVIDER_SITE_OTHER): Payer: Medicare Other | Admitting: Family Medicine

## 2018-11-02 ENCOUNTER — Ambulatory Visit (INDEPENDENT_AMBULATORY_CARE_PROVIDER_SITE_OTHER)
Admission: RE | Admit: 2018-11-02 | Discharge: 2018-11-02 | Disposition: A | Discharge status: Home / Self Care | Payer: Medicare Other | Source: Ambulatory Visit | Attending: Family Medicine | Admitting: Family Medicine

## 2018-11-02 ENCOUNTER — Encounter: Payer: Self-pay | Admitting: Family Medicine

## 2018-11-02 VITALS — BP 150/80 | HR 55 | Ht 65.0 in | Wt 212.0 lb

## 2018-11-02 DIAGNOSIS — M542 Cervicalgia: Secondary | ICD-10-CM | POA: Diagnosis not present

## 2018-11-02 DIAGNOSIS — G8929 Other chronic pain: Secondary | ICD-10-CM

## 2018-11-02 DIAGNOSIS — M5030 Other cervical disc degeneration, unspecified cervical region: Secondary | ICD-10-CM | POA: Diagnosis not present

## 2018-11-02 DIAGNOSIS — M7501 Adhesive capsulitis of right shoulder: Secondary | ICD-10-CM | POA: Diagnosis not present

## 2018-11-02 DIAGNOSIS — M7500 Adhesive capsulitis of unspecified shoulder: Secondary | ICD-10-CM | POA: Insufficient documentation

## 2018-11-02 MED ORDER — GABAPENTIN 100 MG PO CAPS: 200.0000 mg | ORAL_CAPSULE | Freq: Every day | ORAL | 3 refills | Status: DC

## 2018-11-02 NOTE — Patient Instructions (Addendum)
Good to see you  Xray downstairs I thin ka lot of this is inflammation  Once weekly vitamin D and the daily you are doing  Gabapentin 200mg  at night Exercises 3 times a week.  Try the probiotic 1 pill daily to help with any gut inflammation  See me again in 4-6 weeks

## 2018-11-02 NOTE — Assessment & Plan Note (Signed)
Right shoulder but I believe the patient is in stage III of this with increasing range of motion.  Home exercises given today.  Discussed posture and ergonomics.  X-rays of the cervical spine ordered today to see how much degenerative disc disease could be contributing.  Gabapentin given for nighttime relief.  Follow-up again in 4 to 6 weeks

## 2018-11-02 NOTE — Assessment & Plan Note (Signed)
Degenerative disc disease.  Discussed icing regimen and home exercise.  Discussed which activities to do which wants to avoid. X-rays ordered today.  I do believe that this is playing some role.  Gabapentin given.  Follow-up 4 to 6 weeks

## 2018-11-02 NOTE — Progress Notes (Signed)
[facility] [address], [place] [address] Phone: [phone] Subjective:     I Kandace Blitz am serving as a Education administrator for Dr. Hulan Saas.   CC: Bilateral shoulder pain and neck pain  UKG:[id number]  SANAH KRASKA is a 67 y.o. female coming in with complaint of bilateral shoulder pain. Numbness in the middle finger on the left side. Mostly lays on her right side. Uncomfortable most of the time. History of arthritis.   Onset- Chronic  Location- bilateral deltoid pain  Character- Achy pain     Aggravating factors- driving, putting on a coat Therapies tried- Topical diclofenac "rub on relief" Severity-8 out of 10     Past Medical History:  Diagnosis Date  . Allergy   . Anxiety   . Asthma   . Depression   . Hx of colonic polyps   . Hyperlipidemia   . Hypertension   . Osteoarthritis    Past Surgical History:  Procedure Laterality Date  . TUBAL LIGATION     Social History   Socioeconomic History  . Marital status: Divorced    Spouse name: Not on file  . Number of children: Not on file  . Years of education: Not on file  . Highest education level: Not on file  Occupational History  . Not on file  Social Needs  . Financial resource strain: Not on file  . Food insecurity:    Worry: Not on file    Inability: Not on file  . Transportation needs:    Medical: Not on file    Non-medical: Not on file  Tobacco Use  . Smoking status: Former Research scientist (life sciences)  . Smokeless tobacco: Never Used  Substance and Sexual Activity  . Alcohol use: Yes    Alcohol/week: 3.0 standard drinks    Types: 3 Glasses of wine per week    Comment: rarely  . Drug use: No  . Sexual activity: Not Currently  Lifestyle  . Physical activity:    Days per week: Not on file    Minutes per session: Not on file  . Stress: Not on file  Relationships  . Social connections:    Talks on phone: Not on file    Gets together: Not on file    Attends religious service:  Not on file    Active member of club or organization: Not on file    Attends meetings of clubs or organizations: Not on file    Relationship status: Not on file  Other Topics Concern  . Not on file  Social History Narrative  . Not on file   Allergies  Allergen Reactions  . Paxil [Paroxetine Hcl]     Makes her grind her teeth  . Bupropion Hcl Other (See Comments)    palpitations  . Contrast Media [Iodinated Diagnostic Agents] Hives and Itching  . Lisinopril     cough  . Tetracycline Nausea Only   Family History  Problem Relation Age of Onset  . Hyperlipidemia Brother   . Hypertension Brother   . Arthritis Other   . Depression Other   . Colon cancer Neg Hx   . Stomach cancer Neg Hx     Current Outpatient Medications (Endocrine & Metabolic):  [name] Kitchen  MIMVEY 1-0.5 MG tablet, Take 1 tablet by mouth daily. prn  Current Outpatient Medications (Cardiovascular):  .  nebivolol (BYSTOLIC) 10 MG tablet, Take 1 tablet (10 mg total) by mouth daily. .  rosuvastatin (CRESTOR) 10 MG tablet,  Take 1 tablet (10 mg total) by mouth daily. [name] Kitchen  telmisartan-hydrochlorothiazide (MICARDIS HCT) 40-12.5 MG tablet, Take 1 tablet by mouth daily.  Current Outpatient Medications (Respiratory):  .  albuterol (PROVENTIL HFA;VENTOLIN HFA) 108 (90 Base) MCG/ACT inhaler, Inhale 2 puffs into the lungs every 4 (four) hours as needed for wheezing or shortness of breath (cough, shortness of breath or wheezing.). [name] Kitchen  Azelastine HCl 0.15 % SOLN, USE 1 SPRAY IN THE NOSE TWICE DAILY  Current Outpatient Medications (Analgesics):  .  aspirin EC 81 MG tablet, Take 1 tablet (81 mg total) by mouth daily. .  traMADol (ULTRAM) 50 MG tablet, Take 1 tablet (50 mg total) by mouth every 6 (six) hours as needed for severe pain. COntinue to use sparingly.   Current Outpatient Medications (Other):  [name] Kitchen  ALPRAZolam (XANAX) 0.5 MG tablet, Take 1 tablet (0.5 mg total) by mouth 2 (two) times daily as needed for anxiety. .  diclofenac  sodium (VOLTAREN) 1 % GEL, Apply 4 g topically 4 (four) times daily. [name] Kitchen  omeprazole (PRILOSEC) 40 MG capsule, Take 1 capsule (40 mg total) by mouth daily. .  Turmeric 500 MG CAPS, Take 1 capsule by mouth daily. .  Vitamin D, Ergocalciferol, (DRISDOL) 50000 units CAPS capsule, Take 1 capsule (50,000 Units total) by mouth every 7 (seven) days.    Past medical history, social, surgical and family history all reviewed in electronic medical record.  No pertanent information unless stated regarding to the chief complaint.   Review of Systems:  No headache, visual changes, nausea, vomiting, diarrhea, constipation, dizziness, abdominal pain, skin rash, fevers, chills, night sweats, weight loss, swollen lymph nodes, body aches, joint swelling, , chest pain, shortness of breath, mood changes.  Positive muscle aches  Objective  Last menstrual period 04/02/2011.    General: No apparent distress alert and oriented x3 mood and affect normal, dressed appropriately.  HEENT: Pupils equal, extraocular movements intact  Respiratory: Patient's speak in full sentences and does not appear short of breath  Cardiovascular: No lower extremity edema, non tender, no erythema  Skin: Warm dry intact with no signs of infection or rash on extremities or on axial skeleton.  Abdomen: Soft nontender  Neuro: Cranial nerves II through XII are intact, neurovascularly intact in all extremities with 2+ DTRs and 2+ pulses.  Lymph: No lymphadenopathy of posterior or anterior cervical chain or axillae bilaterally.  Gait normal with good balance and coordination.  MSK:  Non tender with full range of motion and good stability and symmetric strength and tone of , elbows, wrist, hip, knee and ankles bilaterally.  Mild arthritic changes of multiple joints Neck: Inspection loss of lordosis. No palpable stepoffs. Negative Spurling's maneuver. Full neck range of motion Grip strength and sensation normal in bilateral hands Strength  good C4 to T1 distribution No sensory change to C4 to T1 Negative Hoffman sign bilaterally Reflexes normal Tightness of the trapezius bilaterally  Shoulder exam of the right side shows decreased range of motion in all planes by at least 5 degrees.  Patient has minimal external range of motion.  Rotator cuff strength is 5 out of 5 and symmetric.  Patient does have positive impingement with Neer and Hawkins right greater than left.  [id number]; 15 additional minutes spent for Therapeutic exercises as stated in above notes.  This included exercises focusing on stretching, strengthening, with significant focus on eccentric aspects.   Long term goals include an improvement in range of motion, strength, endurance as well as avoiding reinjury. Patient's  frequency would include in 1-2 times a day, 3-5 times a week for a duration of 6-12 weeks.  Shoulder Exercises that included:  Basic scapular stabilization to include adduction and depression of scapula Scaption, focusing on proper movement and good control Internal and External rotation utilizing a theraband, with elbow tucked at side entire time Rows with theraband which was given today Proper technique shown and discussed handout in great detail with ATC.  All questions were discussed and answered.     Impression and Recommendations:     This case required medical decision making of moderate complexity. The above documentation has been reviewed and is accurate and complete Kana Grandville Silos       Note: This dictation was prepared with Dragon dictation along with smaller phrase technology. Any transcriptional errors that result from this process are unintentional.

## 2018-11-15 ENCOUNTER — Encounter: Payer: Self-pay | Admitting: Nurse Practitioner

## 2018-11-15 ENCOUNTER — Other Ambulatory Visit: Payer: Self-pay | Admitting: Internal Medicine

## 2018-11-15 ENCOUNTER — Ambulatory Visit: Payer: Self-pay

## 2018-11-15 ENCOUNTER — Ambulatory Visit (INDEPENDENT_AMBULATORY_CARE_PROVIDER_SITE_OTHER): Payer: Medicare Other | Admitting: Nurse Practitioner

## 2018-11-15 VITALS — BP 150/70 | HR 68 | Ht 65.0 in | Wt 214.2 lb

## 2018-11-15 DIAGNOSIS — R112 Nausea with vomiting, unspecified: Secondary | ICD-10-CM

## 2018-11-15 DIAGNOSIS — K5909 Other constipation: Secondary | ICD-10-CM

## 2018-11-15 DIAGNOSIS — I10 Essential (primary) hypertension: Secondary | ICD-10-CM

## 2018-11-15 NOTE — Telephone Encounter (Signed)
Pt. Reports she saw her "stomach doctor today and my BP was 150/70." Reports she has a headache on and off since the weekend. States it is mild, but sometimes feels "like it is pulsating." No other symptoms . States she has been taking her medications as ordered. No availability today. Appointment made with her provider for tomorrow. Instructed if symptoms worsen, to go to ED for evaluation.  Reason for Disposition . [1] MODERATE headache (e.g., interferes with normal activities) AND [2] present > 24 hours AND [3] unexplained  (Exceptions: analgesics not tried, typical migraine, or headache part of viral illness)  Answer Assessment - Initial Assessment Questions 1. LOCATION: "Where does it hurt?"      Hurts on top of headache - at forehead 2. ONSET: "When did the headache start?" (Minutes, hours or days)      This weekend 3. PATTERN: "Does the pain come and go, or has it been constant since it started?"     Comes and goes 4. SEVERITY: "How bad is the pain?" and "What does it keep you from doing?"  (e.g., Scale 1-10; mild, moderate, or severe)   - MILD (1-3): doesn't interfere with normal activities    - MODERATE (4-7): interferes with normal activities or awakens from sleep    - SEVERE (8-10): excruciating pain, unable to do any normal activities        Mild 5. RECURRENT SYMPTOM: "Have you ever had headaches before?" If so, ask: "When was the last time?" and "What happened that time?"      Yes - when diagnosed with HTN 6. CAUSE: "What do you think is causing the headache?"     I THOUGHT IT WAS MY SINUSES 7. MIGRAINE: "Have you been diagnosed with migraine headaches?" If so, ask: "Is this headache similar?"      No 8. HEAD INJURY: "Has there been any recent injury to the head?"      No 9. OTHER SYMPTOMS: "Do you have any other symptoms?" (fever, stiff neck, eye pain, sore throat, cold symptoms)       Lightheaded sometimes  10. PREGNANCY: "Is there any chance you are pregnant?" "When was  your last menstrual period?"       No  Protocols used: HEADACHE-A-AH

## 2018-11-15 NOTE — Progress Notes (Signed)
Chief Complaint:  Nausea     IMPRESSION and PLAN:    58.  67 year old female with a several month history of nausea without vomiting, improved but not alleviated after starting a daily PPI. No associated weight loss.  None of her medications seem to be the culprit. -We will plan for EGD for further evaluatoin. The risks and benefits of EGD were discussed and the patient agrees to proceed.  -If EGD is negative, consider gastric emptying study   2. Chronic constipation, fiber hurts her stomach.  -We discussed a trial of MiraLAX 1 capful daily.  She will consider this but in the meantime will continue Perdiem as needed  HPI:     Patient is a 67 yo female known to Dr. Fuller Plan.  She has a history of constipation, diverticulosis, adenomatous colon polyps (November 2018).    Zimal is here for evaluation of was taking Herbal life protien shakes for two weeks in June after which time the nausea started. Stopped shakes but nausea persisted. She also stopped some of her meds (OTC for constipation, Dr. Gavin Pound Collagen). Except for baby ASA She hasn't had any NSAIDS since 2017 She didn't start any new meds around time nausea started but did start daily baby asa in September. No other nsaid use. Nausea was intially every day. Started Omeprazole in mid July and since then the nausea is intermittent and maybe a couple a times a week. No vomiting.  No associated abdominal pain . No associated weight loss, says she has gained 10-12 pounds. Thyroid studies in July were normal.   Rochell has chronic constipation. Occasionally takes perdiem. PCP gave her samples of fiber tabs last week which helps but seems to irritate her stomach.(aches).  Stools are hard.   Data reviewed:  Labs 10/24/2018.  BMET unremarkable. Labs 06/27/2018.  CBC unremarkable  Review of systems:     No chest pain, no SOB, no fevers, no urinary sx   Past Medical History:  Diagnosis Date  . Allergy   . Anxiety   . Asthma   .  Depression   . Hx of colonic polyps   . Hyperlipidemia   . Hypertension   . Osteoarthritis     Patient's surgical history, family medical history, social history, medications and allergies were all reviewed in Epic   Creatinine clearance cannot be calculated (Patient's most recent lab result is older than the maximum 21 days allowed.)  Current Outpatient Medications  Medication Sig Dispense Refill  . albuterol (PROVENTIL HFA;VENTOLIN HFA) 108 (90 Base) MCG/ACT inhaler Inhale 2 puffs into the lungs every 4 (four) hours as needed for wheezing or shortness of breath (cough, shortness of breath or wheezing.). 1 Inhaler 1  . ALPRAZolam (XANAX) 0.5 MG tablet Take 1 tablet (0.5 mg total) by mouth 2 (two) times daily as needed for anxiety. 180 tablet 1  . aspirin EC 81 MG tablet Take 1 tablet (81 mg total) by mouth daily. 90 tablet 1  . Azelastine HCl 0.15 % SOLN USE 1 SPRAY IN THE NOSE TWICE DAILY 90 mL 1  . diclofenac sodium (VOLTAREN) 1 % GEL Apply 4 g topically 4 (four) times daily. 100 g 0  . MIMVEY 1-0.5 MG tablet Take 1 tablet by mouth daily. prn  3  . nebivolol (BYSTOLIC) 10 MG tablet Take 1 tablet (10 mg total) by mouth daily. 84 tablet 0  . omeprazole (PRILOSEC) 40 MG capsule Take 1 capsule (40 mg total) by mouth daily. [place]osh  capsule 1  . rosuvastatin (CRESTOR) 10 MG tablet Take 1 tablet (10 mg total) by mouth daily. 90 tablet 0  . telmisartan-hydrochlorothiazide (MICARDIS HCT) 40-12.5 MG tablet Take 1 tablet by mouth daily. 30 tablet 1  . traMADol (ULTRAM) 50 MG tablet Take 1 tablet (50 mg total) by mouth every 6 (six) hours as needed for severe pain. COntinue to use sparingly. 90 tablet 0  . Turmeric 500 MG CAPS Take 1 capsule by mouth daily. 90 capsule 1  . Vitamin D, Ergocalciferol, (DRISDOL) 50000 units CAPS capsule Take 1 capsule (50,000 Units total) by mouth every 7 (seven) days. 12 capsule 1   No current facility-administered medications for this visit.     Physical Exam:      BP (!) 150/70   Pulse 68   Ht 5\' 5"  (1.651 m)   Wt 214 lb 3.2 oz (97.2 kg)   LMP 04/02/2011   BMI 35.64 kg/m   GENERAL:  Pleasant female in NAD PSYCH: : Cooperative, normal affect EENT:  conjunctiva pink, mucous membranes moist, neck supple without masses CARDIAC:  RRR, no murmur heard, no peripheral edema PULM: Normal respiratory effort, lungs CTA bilaterally, no wheezing ABDOMEN:  Nondistended, soft, nontender. No obvious masses, no hepatomegaly,  normal bowel sounds SKIN:  turgor, no lesions seen Musculoskeletal:  Normal muscle tone, normal strength NEURO: Alert and oriented x 3, no focal neurologic deficits   [name] , NP 11/15/2018, 10:41 AM

## 2018-11-15 NOTE — Patient Instructions (Signed)
If you are age 65 or older, your body mass index should be between 23-30. Your Body mass index is 35.64 kg/m. If this is out of the aforementioned range listed, please consider follow up with your Primary Care Provider.  If you are age 60 or younger, your body mass index should be between 19-25. Your Body mass index is 35.64 kg/m. If this is out of the aformentioned range listed, please consider follow up with your Primary Care Provider.   You have been scheduled for an endoscopy. Please follow written instructions given to you at your visit today. If you use inhalers (even only as needed), please bring them with you on the day of your procedure. Your physician has requested that you go to www.startemmi.com and enter the access code given to you at your visit today. This web site gives a general overview about your procedure. However, you should still follow specific instructions given to you by our office regarding your preparation for the procedure.  Thank you for choosing me and [facility].   [name], NP

## 2018-11-15 NOTE — Progress Notes (Signed)
Reviewed and agree with management plan.  Malcolm T. Stark, MD FACG 

## 2018-11-16 ENCOUNTER — Encounter: Payer: Self-pay | Admitting: Family Medicine

## 2018-11-16 ENCOUNTER — Ambulatory Visit (INDEPENDENT_AMBULATORY_CARE_PROVIDER_SITE_OTHER): Payer: Medicare Other | Admitting: Family Medicine

## 2018-11-16 ENCOUNTER — Other Ambulatory Visit: Payer: Self-pay

## 2018-11-16 VITALS — BP 144/80 | HR 52 | Ht 65.0 in | Wt 212.4 lb

## 2018-11-16 DIAGNOSIS — I10 Essential (primary) hypertension: Secondary | ICD-10-CM

## 2018-11-16 DIAGNOSIS — E785 Hyperlipidemia, unspecified: Secondary | ICD-10-CM

## 2018-11-16 DIAGNOSIS — E559 Vitamin D deficiency, unspecified: Secondary | ICD-10-CM

## 2018-11-16 DIAGNOSIS — Z6835 Body mass index (BMI) 35.0-35.9, adult: Secondary | ICD-10-CM

## 2018-11-16 MED ORDER — TELMISARTAN 40 MG PO TABS: 40.0000 mg | ORAL_TABLET | Freq: Every day | ORAL | 1 refills | Status: DC

## 2018-11-16 MED ORDER — HYDROCHLOROTHIAZIDE 25 MG PO TABS: 25.0000 mg | ORAL_TABLET | Freq: Every day | ORAL | 1 refills | Status: DC

## 2018-11-16 NOTE — Progress Notes (Signed)
12/4/20195:14 PM  [facility] 01/19/1951, 67 y.o. female [id number]  Chief Complaint  Patient presents with  . Headache    wants to talk about medication, thinking medication needs to be adjusted. Used home remedy of braggs vinegar which seem to help a bit. Requested to have CBC today for concern of medication taken    HPI:   Patient is a 67 y.o. female with past medical history significant for HTN, asthma, OA, HLP, GERD, vitamin D deficiency, anxiety who presents today for routine followup   GI - plan for EGD,+/- gastric emptying study, miralax Constipation doing well with probiotics  BP has been running high, checks at home at GI 150/70 Has been mild headaches and dizzines of recent Reports pulse normally in the 60s  Tolerating crestor well  Trying to lose weight Eating 2 small meals  A day  Had hives after taking gabapentin Sports meds has her taking D3 and D2 Having increased energy    Fall Risk  11/16/2018 10/29/2018 02/23/2018 01/12/2018 09/17/2017  Falls in the past year? 0 0 No No No     Depression screen [facility] 2/9 11/16/2018 10/29/2018 10/24/2018  Decreased Interest 0 1 1  Down, Depressed, Hopeless 0 0 0  PHQ - 2 Score 0 1 1  Altered sleeping - 0 0  Tired, decreased energy - 3 2  Change in appetite - 2 1  Feeling bad or failure about yourself  - 0 1  Trouble concentrating - 0 0  Moving slowly or fidgety/restless - 0 0  Suicidal thoughts - 0 0  PHQ-9 Score - 6 5  Difficult doing work/chores - Not difficult at all Somewhat difficult    Allergies  Allergen Reactions  . Paxil [Paroxetine Hcl]     Makes her grind her teeth  . Bupropion Hcl Other (See Comments)    palpitations  . Contrast Media [Iodinated Diagnostic Agents] Hives and Itching  . Gabapentin Hives    Took one time, and got hives on the side of her face and the Right eye area swollen.   . Lisinopril     cough  . Tetracycline Nausea Only    Prior to Admission medications   Medication  Sig Start Date End Date Taking? Authorizing Provider  albuterol (PROVENTIL HFA;VENTOLIN HFA) 108 (90 Base) MCG/ACT inhaler Inhale 2 puffs into the lungs every 4 (four) hours as needed for wheezing or shortness of breath (cough, shortness of breath or wheezing.). 09/02/16  Yes English, Stephanie D, PA  ALPRAZolam (XANAX) 0.5 MG tablet Take 1 tablet (0.5 mg total) by mouth 2 (two) times daily as needed for anxiety. 08/23/18  Yes [name], MD  aspirin EC 81 MG tablet Take 1 tablet (81 mg total) by mouth daily. 08/24/18  Yes [name], MD  Azelastine HCl 0.15 % SOLN USE 1 SPRAY IN THE NOSE TWICE DAILY 09/28/18  Yes [name], MD  diclofenac sodium (VOLTAREN) 1 % GEL Apply 4 g topically 4 (four) times daily. 02/23/18  Yes Tereasa Coop, PA-C  MIMVEY 1-0.5 MG tablet Take 1 tablet by mouth daily. prn 06/17/16  Yes [provider]  nebivolol (BYSTOLIC) 10 MG tablet Take 1 tablet (10 mg total) by mouth daily. 10/24/18  Yes [name], MD  omeprazole (PRILOSEC) 40 MG capsule Take 1 capsule (40 mg total) by mouth daily. 08/23/18  Yes [name], MD  rosuvastatin (CRESTOR) 10 MG tablet Take 1 tablet (10 mg total) by mouth daily.  10/29/18  Yes Rutherford Guys, MD  telmisartan-hydrochlorothiazide (MICARDIS HCT) 40-12.5 MG tablet Take 1 tablet by mouth daily. 09/19/18  Yes Marrian Salvage, FNP  traMADol (ULTRAM) 50 MG tablet Take 1 tablet (50 mg total) by mouth every 6 (six) hours as needed for severe pain. COntinue to use sparingly. 02/23/18  Yes Tereasa Coop, PA-C  Turmeric 500 MG CAPS Take 1 capsule by mouth daily. 08/23/18  Yes [name], MD  Vitamin D, Ergocalciferol, (DRISDOL) 50000 units CAPS capsule Take 1 capsule (50,000 Units total) by mouth every 7 (seven) days. 08/24/18  Yes [name], MD    Past Medical History:  Diagnosis Date  . Allergy   . Anxiety   . Asthma   . Depression   . Hx of colonic polyps   . Hyperlipidemia   . Hypertension   .  Osteoarthritis     Past Surgical History:  Procedure Laterality Date  . TUBAL LIGATION      Social History   Tobacco Use  . Smoking status: Former Research scientist (life sciences)  . Smokeless tobacco: Never Used  Substance Use Topics  . Alcohol use: Yes    Alcohol/week: 3.0 standard drinks    Types: 3 Glasses of wine per week    Comment: rarely    Family History  Problem Relation Age of Onset  . Hyperlipidemia Brother   . Hypertension Brother   . Arthritis Other   . Depression Other   . Colon cancer Neg Hx   . Stomach cancer Neg Hx     ROS Per hpi  OBJECTIVE:  Blood pressure (!) 144/80, pulse (!) 52, height 5\' 5"  (1.651 m), weight 212 lb 6.4 oz (96.3 kg), last menstrual period 04/02/2011, SpO2 95 %. Body mass index is 35.35 kg/m.   BP Readings from Last 3 Encounters:  11/16/18 (!) 144/80  11/15/18 (!) 150/70  11/02/18 (!) 150/80   Wt Readings from Last 3 Encounters:  11/16/18 212 lb 6.4 oz (96.3 kg)  11/15/18 214 lb 3.2 oz (97.2 kg)  11/02/18 212 lb (96.2 kg)    Physical Exam  Constitutional: She is oriented to person, place, and time. She appears well-developed and well-nourished.  HENT:  Head: Normocephalic and atraumatic.  Mouth/Throat: Mucous membranes are normal.  Eyes: Pupils are equal, round, and reactive to light. Conjunctivae and EOM are normal. No scleral icterus.  Neck: Neck supple.  Pulmonary/Chest: Effort normal.  Neurological: She is alert and oriented to person, place, and time.  Skin: Skin is warm and dry.  Psychiatric: She has a normal mood and affect.  Nursing note and vitals reviewed.   ASSESSMENT and PLAN  1. Essential hypertension, benign Improved, increasing hctz to 25mg . Cont arb and BB. Cont checking BP at home. RTC precautions given - CBC with Differential/Platelet  2. Vitamin D deficiency disease Checking labs today, medications will be adjusted as needed.  - Vitamin D, 25-hydroxy  3. Hyperlipidemia with target LDL less than 130 Checking  labs today, medications will be adjusted as needed.  - Lipid panel - Comprehensive metabolic panel   4. BMI 35.0-35.9 Discussed weight loss strategies, balanced diet, exercise.  Consider referral to weight mgt  Return for as scheduled.    Rutherford Guys, MD [facility] [place], [place] [address] Ph.  [phone] Fax [phone]

## 2018-11-16 NOTE — Patient Instructions (Signed)
° ° ° °  If you have lab work done today you will be contacted with your lab results within the next 2 weeks.  If you have not heard from us then please contact us. The fastest way to get your results is to register for My Chart. ° ° °IF you received an x-ray today, you will receive an invoice from [facility]. Please contact [facility] at [phone] with questions or concerns regarding your invoice.  ° °IF you received labwork today, you will receive an invoice from [facility]. Please contact [facility] at [phone] with questions or concerns regarding your invoice.  ° °Our billing staff will not be able to assist you with questions regarding bills from these companies. ° °You will be contacted with the lab results as soon as they are available. The fastest way to get your results is to activate your My Chart account. Instructions are located on the last page of this paperwork. If you have not heard from us regarding the results in 2 weeks, please contact this office. °  ° ° ° °

## 2018-11-17 LAB — CBC WITH DIFFERENTIAL/PLATELET
Basophils Absolute: 0 10*3/uL (ref 0.0–0.2)
Basos: 1 %
EOS (ABSOLUTE): 0.1 10*3/uL (ref 0.0–0.4)
Eos: 1 %
Hematocrit: 34.1 % (ref 34.0–46.6)
Hemoglobin: 11.6 g/dL (ref 11.1–15.9)
Immature Grans (Abs): 0 10*3/uL (ref 0.0–0.1)
Immature Granulocytes: 0 %
Lymphocytes Absolute: 1.4 10*3/uL (ref 0.7–3.1)
Lymphs: 37 %
MCH: 27.8 pg (ref 26.6–33.0)
MCHC: 34 g/dL (ref 31.5–35.7)
MCV: 82 fL (ref 79–97)
Monocytes Absolute: 0.3 10*3/uL (ref 0.1–0.9)
Monocytes: 8 %
Neutrophils Absolute: 2.1 10*3/uL (ref 1.4–7.0)
Neutrophils: 53 %
Platelets: 188 10*3/uL (ref 150–450)
RBC: 4.17 x10E6/uL (ref 3.77–5.28)
RDW: 13.1 % (ref 12.3–15.4)
WBC: 3.9 10*3/uL (ref 3.4–10.8)

## 2018-11-17 LAB — COMPREHENSIVE METABOLIC PANEL
ALT: 20 IU/L (ref 0–32)
AST: 23 IU/L (ref 0–40)
Albumin/Globulin Ratio: 1.8 (ref 1.2–2.2)
Albumin: 4.4 g/dL (ref 3.6–4.8)
Alkaline Phosphatase: 68 IU/L (ref 39–117)
BUN/Creatinine Ratio: 10 — ABNORMAL LOW (ref 12–28)
BUN: 9 mg/dL (ref 8–27)
Bilirubin Total: 0.3 mg/dL (ref 0.0–1.2)
CO2: 24 mmol/L (ref 20–29)
Calcium: 9.3 mg/dL (ref 8.7–10.3)
Chloride: 100 mmol/L (ref 96–106)
Creatinine, Ser: 0.86 mg/dL (ref 0.57–1.00)
GFR calc Af Amer: 82 mL/min/{1.73_m2} (ref >59)
GFR calc non Af Amer: 71 mL/min/{1.73_m2} (ref >59)
Globulin, Total: 2.4 g/dL (ref 1.5–4.5)
Glucose: 80 mg/dL (ref 65–99)
Potassium: 3.5 mmol/L (ref 3.5–5.2)
Sodium: 141 mmol/L (ref 134–144)
Total Protein: 6.8 g/dL (ref 6.0–8.5)

## 2018-11-17 LAB — LIPID PANEL
Chol/HDL Ratio: 3.9 ratio (ref 0.0–4.4)
Cholesterol, Total: 198 mg/dL (ref 100–199)
HDL: 51 mg/dL (ref >39)
LDL Calculated: 128 mg/dL — ABNORMAL HIGH (ref 0–99)
Triglycerides: 94 mg/dL (ref 0–149)
VLDL Cholesterol Cal: 19 mg/dL (ref 5–40)

## 2018-11-17 LAB — VITAMIN D 25 HYDROXY (VIT D DEFICIENCY, FRACTURES): Vit D, 25-Hydroxy: 30 ng/mL (ref 30.0–100.0)

## 2018-11-21 ENCOUNTER — Ambulatory Visit: Payer: Self-pay | Admitting: *Deleted

## 2018-11-21 MED ORDER — DULOXETINE HCL 20 MG PO CPEP: 20.0000 mg | ORAL_CAPSULE | Freq: Every day | ORAL | 3 refills | Status: DC

## 2018-11-21 NOTE — Telephone Encounter (Signed)
Spoke with patient that prescription is at her pharmacy for an alternate pain medication. Patient voices understanding.

## 2018-11-21 NOTE — Telephone Encounter (Signed)
Patient was seen by Dr Tamala Julian on 11/19 and given Gabapentin for pain in the shoulder to help her sleep. Patient states she only took it 1 time- because she she woke up and she had R eye swelling and 2 hive areas on the R side of face. Patient thought she may be having a reaction to the medication and she stopped the medicine. Patient has only been taking Ultram. Patient saw GI doctor last week and mentioned this- they told her she she avoid this medication. Patient is calling to report she is having pain in her shoulders and wants to know if there is something else she can try. Patient has follow up on 12/30.  Patient normally only takes Ultram as needed- some days she may not even need it. Today she had to take 2 pills. Told patient I would let Dr Tamala Julian know that she had the reaction and see if there is something else that she can take for her discomfort.

## 2018-11-21 NOTE — Telephone Encounter (Signed)
Started low dose cymbalta to help with the pain and nerve pain

## 2018-11-21 NOTE — Telephone Encounter (Signed)
  Reason for Disposition . Caller requesting a NON-URGENT new prescription or refill and triager unable to refill per unit policy  Answer Assessment - Initial Assessment Questions 1. SYMPTOMS: "Do you have any symptoms?"     Medication reaction- patient wants to know if there is some thing different she can try 2. SEVERITY: If symptoms are present, ask "Are they mild, moderate or severe?"     Shoulder pain.  Protocols used: MEDICATION QUESTION CALL-A-AH

## 2018-11-22 ENCOUNTER — Telehealth: Payer: Self-pay | Admitting: Family Medicine

## 2018-11-22 NOTE — Telephone Encounter (Unsigned)
Copied from [place] [id number]. Topic: General - Other >> Nov 22, 2018  1:49 PM Alanda Slim E wrote: Reason for CRM: Pt called to find out the status or results of her labs from 12.04.2019/ please advise

## 2018-11-25 NOTE — Telephone Encounter (Signed)
Please advise results? 

## 2018-11-25 NOTE — Telephone Encounter (Signed)
I posted results on mychart But just in case... Your labs are normal You do not have anemia You have normal electrolytes, kidney, liver and thyroid function Your vitamin D is at low normal. Start a daily supplement of 2000 units.  Your cholesterol is much improved! Continue with rosuvastatin. thanks

## 2018-11-28 DIAGNOSIS — M5412 Radiculopathy, cervical region: Secondary | ICD-10-CM | POA: Diagnosis not present

## 2018-11-28 DIAGNOSIS — M542 Cervicalgia: Secondary | ICD-10-CM | POA: Diagnosis not present

## 2018-12-01 ENCOUNTER — Other Ambulatory Visit: Payer: Self-pay | Admitting: Family Medicine

## 2018-12-01 ENCOUNTER — Other Ambulatory Visit: Payer: Self-pay | Admitting: Physician Assistant

## 2018-12-01 DIAGNOSIS — M25562 Pain in left knee: Principal | ICD-10-CM

## 2018-12-01 DIAGNOSIS — M25552 Pain in left hip: Secondary | ICD-10-CM

## 2018-12-01 DIAGNOSIS — M25561 Pain in right knee: Secondary | ICD-10-CM

## 2018-12-01 NOTE — Telephone Encounter (Signed)
Copied from [place] [id number]. Topic: Quick Communication - Rx Refill/Question >> Dec 01, 2018  9:09 AM Margot Ables wrote: Medication: traMADol (ULTRAM) 50 MG tablet  - 2 tablets left - pt is taking 1-2/day  Has the patient contacted their pharmacy? No - just changed to Dr. Pamella Pert Preferred Pharmacy (with phone number or street name): [id number] [address], [place] [phone] (Phone) [phone] (Fax)

## 2018-12-01 NOTE — Telephone Encounter (Signed)
Please advise 

## 2018-12-01 NOTE — Telephone Encounter (Signed)
Please send to Dr. Pamella Pert.  I have never seen this patient before.  Thanks.

## 2018-12-01 NOTE — Telephone Encounter (Signed)
Requested medication (s) are due for refill today: yes  Requested medication (s) are on the active medication list: yes  Last refill:  02/23/18 for 90 tabs  Future visit scheduled: yes  Notes to clinic:  Med can not be delegated. Need urine drug screen.   Requested Prescriptions  Pending Prescriptions Disp Refills   traMADol (ULTRAM) 50 MG tablet 90 tablet 0    Sig: Take 1 tablet (50 mg total) by mouth every 6 (six) hours as needed for severe pain. COntinue to use sparingly.     Not Delegated - Analgesics:  Opioid Agonists Failed - 12/01/2018  9:27 AM      Failed - This refill cannot be delegated      Failed - Urine Drug Screen completed in last 360 days.      Passed - Valid encounter within last 6 months    Recent Outpatient Visits          2 weeks ago Essential hypertension, benign   [facility], Lilia Argue, MD   4 weeks ago Chronic neck pain   [place], [place], DO   1 month ago Essential hypertension, benign   [facility], Lilia Argue, MD   1 month ago Vitamin D deficiency disease   [place], Thomas L, MD   2 months ago Essential hypertension, benign   [place], Marvis Repress, [place]      Future Appointments            In 1 week Lyndal Pulley, [place], [place]   In 2 weeks Rutherford Guys, MD [facility] at [place], [facility]

## 2018-12-02 NOTE — Telephone Encounter (Signed)
Patient's concern/request has been addressed already. 

## 2018-12-02 NOTE — Telephone Encounter (Signed)
pmp reviewed Takes prn Last refill in march 2019 Med refilled

## 2018-12-12 ENCOUNTER — Ambulatory Visit: Payer: Medicare Other | Admitting: Family Medicine

## 2018-12-13 DIAGNOSIS — M542 Cervicalgia: Secondary | ICD-10-CM | POA: Diagnosis not present

## 2018-12-16 ENCOUNTER — Ambulatory Visit (AMBULATORY_SURGERY_CENTER): Payer: Medicare Other | Admitting: Gastroenterology

## 2018-12-16 ENCOUNTER — Encounter: Payer: Self-pay | Admitting: Gastroenterology

## 2018-12-16 VITALS — BP 137/68 | HR 55 | Temp 96.9°F | Resp 25 | Ht 65.0 in | Wt 212.0 lb

## 2018-12-16 DIAGNOSIS — F419 Anxiety disorder, unspecified: Secondary | ICD-10-CM | POA: Diagnosis not present

## 2018-12-16 DIAGNOSIS — K2980 Duodenitis without bleeding: Secondary | ICD-10-CM | POA: Diagnosis not present

## 2018-12-16 DIAGNOSIS — K319 Disease of stomach and duodenum, unspecified: Secondary | ICD-10-CM

## 2018-12-16 DIAGNOSIS — I10 Essential (primary) hypertension: Secondary | ICD-10-CM | POA: Diagnosis not present

## 2018-12-16 DIAGNOSIS — R112 Nausea with vomiting, unspecified: Secondary | ICD-10-CM

## 2018-12-16 DIAGNOSIS — K2950 Unspecified chronic gastritis without bleeding: Secondary | ICD-10-CM | POA: Diagnosis not present

## 2018-12-16 DIAGNOSIS — K2970 Gastritis, unspecified, without bleeding: Secondary | ICD-10-CM | POA: Diagnosis not present

## 2018-12-16 DIAGNOSIS — K3189 Other diseases of stomach and duodenum: Secondary | ICD-10-CM

## 2018-12-16 DIAGNOSIS — J45909 Unspecified asthma, uncomplicated: Secondary | ICD-10-CM | POA: Diagnosis not present

## 2018-12-16 MED ORDER — OMEPRAZOLE 40 MG PO CPDR: 40.0000 mg | DELAYED_RELEASE_CAPSULE | Freq: Every day | ORAL | 0 refills | Status: DC

## 2018-12-16 MED ORDER — SODIUM CHLORIDE 0.9 % IV SOLN: 500.0000 mL | Freq: Once | INTRAVENOUS | Status: DC

## 2018-12-16 NOTE — Patient Instructions (Addendum)
Information on gastritis given to you today.  Await pathology results.  Take omeprazole 40mg  every day for one month. Take FDgard 1-2 by mouth 3 times daily for one month. Over the counter.  YOU HAD AN ENDOSCOPIC PROCEDURE TODAY AT [facility]:   Refer to the procedure report that was given to you for any specific questions about what was found during the examination.  If the procedure report does not answer your questions, please call your gastroenterologist to clarify.  If you requested that your care partner not be given the details of your procedure findings, then the procedure report has been included in a sealed envelope for you to review at your convenience later.  YOU SHOULD EXPECT: Some feelings of bloating in the abdomen. Passage of more gas than usual.  Walking can help get rid of the air that was put into your GI tract during the procedure and reduce the bloating. If you had a lower endoscopy (such as a colonoscopy or flexible sigmoidoscopy) you may notice spotting of blood in your stool or on the toilet paper. If you underwent a bowel prep for your procedure, you may not have a normal bowel movement for a few days.  Please Note:  You might notice some irritation and congestion in your nose or some drainage.  This is from the oxygen used during your procedure.  There is no need for concern and it should clear up in a day or so.  SYMPTOMS TO REPORT IMMEDIATELY:    Following upper endoscopy (EGD)  Vomiting of blood or coffee ground material  New chest pain or pain under the shoulder blades  Painful or persistently difficult swallowing  New shortness of breath  Fever of 100F or higher  Black, tarry-looking stools  For urgent or emergent issues, a gastroenterologist can be reached at any hour by calling [phone].   DIET:  We do recommend a small meal at first, but then you may proceed to your regular diet.  Drink plenty of fluids but you should avoid  alcoholic beverages for 24 hours.  ACTIVITY:  You should plan to take it easy for the rest of today and you should NOT DRIVE or use heavy machinery until tomorrow (because of the sedation medicines used during the test).    FOLLOW UP: Our staff will call the number listed on your records the next business day following your procedure to check on you and address any questions or concerns that you may have regarding the information given to you following your procedure. If we do not reach you, we will leave a message.  However, if you are feeling well and you are not experiencing any problems, there is no need to return our call.  We will assume that you have returned to your regular daily activities without incident.  If any biopsies were taken you will be contacted by phone or by letter within the next 1-3 weeks.  Please call us at [phone] if you have not heard about the biopsies in 3 weeks.    SIGNATURES/CONFIDENTIALITY: You and/or your care partner have signed paperwork which will be entered into your electronic medical record.  These signatures attest to the fact that that the information above on your After Visit Summary has been reviewed and is understood.  Full responsibility of the confidentiality of this discharge information lies with you and/or your care-partner.

## 2018-12-16 NOTE — Progress Notes (Signed)
Called to room to assist during endoscopic procedure.  Patient ID and intended procedure confirmed with present staff. Received instructions for my participation in the procedure from the performing physician.  

## 2018-12-16 NOTE — Progress Notes (Signed)
Pt's states no medical or surgical changes since previsit or office visit. 

## 2018-12-16 NOTE — Op Note (Addendum)
[facility] Patient Name: Paula Meyer Procedure Date: 12/16/2018 9:44 AM MRN: [id number] Endoscopist: Ladene Artist , MD Age: 68 Referring MD:  Date of Birth: Feb 20, 1951 Gender: Female Account #: [id number] Procedure:                Upper GI endoscopy Indications:              Nausea Medicines:                Monitored Anesthesia Care Procedure:                Pre-Anesthesia Assessment:                           - Prior to the procedure, a History and Physical                            was performed, and patient medications and                            allergies were reviewed. The patient's tolerance of                            previous anesthesia was also reviewed. The risks                            and benefits of the procedure and the sedation                            options and risks were discussed with the patient.                            All questions were answered, and informed consent                            was obtained. Prior Anticoagulants: The patient has                            taken no previous anticoagulant or antiplatelet                            agents. ASA Grade Assessment: II - A patient with                            mild systemic disease. After reviewing the risks                            and benefits, the patient was deemed in                            satisfactory condition to undergo the procedure.                           After obtaining informed consent, the endoscope was  passed under direct vision. Throughout the                            procedure, the patient's blood pressure, pulse, and                            oxygen saturations were monitored continuously. The                            Endoscope was introduced through the mouth, and                            advanced to the second part of duodenum. The upper                            GI endoscopy was accomplished without difficulty.                             The patient tolerated the procedure well. Scope In: Scope Out: Findings:                 The examined esophagus was normal.                           Patchy mildly erythematous mucosa without bleeding                            was found in the gastric body. Biopsies were taken                            with a cold forceps for histology.                           The exam of the stomach was otherwise normal.                           A single 5 mm mucosal nodule with a localized                            distribution was found in the second portion of the                            duodenum. Biopsies were taken with a cold forceps                            for histology.                           A few 3 to 6 mm mucosal nodules with a localized                            distribution were found in the duodenal bulb.  Biopsies were taken with a cold forceps for                            histology.                           The exam of the duodenum was otherwise normal. Complications:            No immediate complications. Estimated Blood Loss:     Estimated blood loss was minimal. Impression:               - Normal esophagus.                           - Erythematous mucosa in the gastric body. Biopsied.                           - Mucosal nodule found in the second duodenum.                            Biopsied.                           - Mucosal nodules found in the duodenal bulb.                            Biopsied. Recommendation:           - Resume previous diet.                           - Continue present medications.                           - Await pathology results.                           - Take omeprazole 40 mg po daily every day for 1                            month (not prn)                           - FDgard 1-2 po tid ac for 1 month (not prn) Ladene Artist, MD 12/16/2018 10:15:09 AM This report has been  signed electronically.

## 2018-12-16 NOTE — Progress Notes (Signed)
PT taken to PACU. Monitors in place. VSS. Report given to RN. 

## 2018-12-19 ENCOUNTER — Telehealth: Payer: Self-pay | Admitting: Gastroenterology

## 2018-12-19 ENCOUNTER — Telehealth: Payer: Self-pay

## 2018-12-19 ENCOUNTER — Ambulatory Visit: Payer: BC Managed Care – PPO | Admitting: Family Medicine

## 2018-12-19 NOTE — Telephone Encounter (Signed)
Left message on f/u call 

## 2018-12-19 NOTE — Telephone Encounter (Signed)
Pt had endo 12/16/18 with Dr. Fuller Plan.  Pt has questions about the anesthesia she was given and would like a CB.

## 2018-12-19 NOTE — Telephone Encounter (Signed)
Left message to give [place] a call if any questions or concerns.

## 2018-12-19 NOTE — Telephone Encounter (Signed)
Left message for patient to call back  

## 2018-12-19 NOTE — Telephone Encounter (Signed)
All questions answered. She is having some vision changes.  She will call her pharmacist and ask about possible side effects of her medications.

## 2018-12-20 DIAGNOSIS — H2513 Age-related nuclear cataract, bilateral: Secondary | ICD-10-CM | POA: Diagnosis not present

## 2018-12-20 DIAGNOSIS — M542 Cervicalgia: Secondary | ICD-10-CM | POA: Diagnosis not present

## 2018-12-22 DIAGNOSIS — M542 Cervicalgia: Secondary | ICD-10-CM | POA: Diagnosis not present

## 2018-12-26 ENCOUNTER — Encounter: Payer: Self-pay | Admitting: Gastroenterology

## 2018-12-29 DIAGNOSIS — M542 Cervicalgia: Secondary | ICD-10-CM | POA: Diagnosis not present

## 2018-12-30 ENCOUNTER — Telehealth: Payer: Self-pay | Admitting: Gastroenterology

## 2018-12-30 NOTE — Telephone Encounter (Signed)
Patient states she received her results letter from procedure on 1.3.2020 and would like to know what the next steps are and discuss further. Patient wants to know if there is a diet she should be following or if she needs to continue the medications she is currently taking.

## 2019-01-02 NOTE — Telephone Encounter (Signed)
No answer/machine 

## 2019-01-02 NOTE — Telephone Encounter (Signed)
Patient's results reviewed with her We discussed that she should take her omeprazole ac breakfast daily.  She is scheduled for a follow up with [name] RNP for 01/27/19

## 2019-01-05 DIAGNOSIS — M542 Cervicalgia: Secondary | ICD-10-CM | POA: Diagnosis not present

## 2019-01-10 ENCOUNTER — Telehealth: Payer: Self-pay | Admitting: Family Medicine

## 2019-01-10 DIAGNOSIS — M25562 Pain in left knee: Principal | ICD-10-CM

## 2019-01-10 DIAGNOSIS — M25552 Pain in left hip: Secondary | ICD-10-CM

## 2019-01-10 DIAGNOSIS — M25561 Pain in right knee: Secondary | ICD-10-CM

## 2019-01-10 NOTE — Telephone Encounter (Signed)
Copied from [place] [id number]. Topic: Quick Communication - Rx Refill/Question >> Jan 10, 2019  8:27 AM Margot Ables wrote: Medication: traMADol (ULTRAM) 50 MG tablet - pt is out  Last refill 12/02/2018 #60  Has the patient contacted their pharmacy? No - no refills - needed appt Preferred Pharmacy (with phone number or street name): [id number] [address], [place] [phone] (Phone) [phone] (Fax)

## 2019-01-11 DIAGNOSIS — M542 Cervicalgia: Secondary | ICD-10-CM | POA: Diagnosis not present

## 2019-01-13 NOTE — Telephone Encounter (Signed)
Please refill if possible

## 2019-01-15 MED ORDER — TRAMADOL HCL 50 MG PO TABS: 50.0000 mg | ORAL_TABLET | Freq: Two times a day (BID) | ORAL | 0 refills | Status: DC | PRN

## 2019-01-15 NOTE — Telephone Encounter (Signed)
pmp reviewed Takes prn - neck pain Med refilled #30, no refills

## 2019-01-20 ENCOUNTER — Other Ambulatory Visit: Payer: Self-pay | Admitting: Family Medicine

## 2019-01-20 ENCOUNTER — Ambulatory Visit (INDEPENDENT_AMBULATORY_CARE_PROVIDER_SITE_OTHER): Payer: Medicare Other | Admitting: Family Medicine

## 2019-01-20 ENCOUNTER — Encounter: Payer: Self-pay | Admitting: Family Medicine

## 2019-01-20 ENCOUNTER — Other Ambulatory Visit: Payer: Self-pay

## 2019-01-20 VITALS — BP 128/76 | HR 65 | Temp 98.8°F | Ht 65.0 in | Wt 206.0 lb

## 2019-01-20 DIAGNOSIS — M25562 Pain in left knee: Secondary | ICD-10-CM | POA: Diagnosis not present

## 2019-01-20 DIAGNOSIS — E559 Vitamin D deficiency, unspecified: Secondary | ICD-10-CM | POA: Diagnosis not present

## 2019-01-20 DIAGNOSIS — K219 Gastro-esophageal reflux disease without esophagitis: Secondary | ICD-10-CM | POA: Diagnosis not present

## 2019-01-20 DIAGNOSIS — M25552 Pain in left hip: Secondary | ICD-10-CM

## 2019-01-20 DIAGNOSIS — E785 Hyperlipidemia, unspecified: Secondary | ICD-10-CM

## 2019-01-20 DIAGNOSIS — M25561 Pain in right knee: Secondary | ICD-10-CM | POA: Diagnosis not present

## 2019-01-20 DIAGNOSIS — Z6834 Body mass index (BMI) 34.0-34.9, adult: Secondary | ICD-10-CM | POA: Diagnosis not present

## 2019-01-20 DIAGNOSIS — I10 Essential (primary) hypertension: Secondary | ICD-10-CM

## 2019-01-20 MED ORDER — AMLODIPINE BESYLATE 5 MG PO TABS: 5.0000 mg | ORAL_TABLET | Freq: Every day | ORAL | 1 refills | Status: DC

## 2019-01-20 MED ORDER — TRAMADOL HCL 50 MG PO TABS: 50.0000 mg | ORAL_TABLET | Freq: Two times a day (BID) | ORAL | 0 refills | Status: DC | PRN

## 2019-01-20 MED ORDER — ALBUTEROL SULFATE HFA 108 (90 BASE) MCG/ACT IN AERS: 2.0000 | INHALATION_SPRAY | RESPIRATORY_TRACT | 1 refills | Status: DC | PRN

## 2019-01-20 NOTE — Telephone Encounter (Signed)
Requested Prescriptions  Pending Prescriptions Disp Refills  . rosuvastatin (CRESTOR) 10 MG tablet [Pharmacy Med Name: ROSUVASTATIN CALCIUM 10 MG TAB] 90 tablet 0    Sig: TAKE 1 TABLET BY MOUTH EVERY DAY     Cardiovascular:  Antilipid - Statins Failed - 01/20/2019  1:21 AM      Failed - LDL in normal range and within 360 days    LDL Calculated  Date Value Ref Range Status  11/16/2018 128 (H) 0 - 99 mg/dL Final         Passed - Total Cholesterol in normal range and within 360 days    Cholesterol, Total  Date Value Ref Range Status  11/16/2018 198 100 - 199 mg/dL Final         Passed - HDL in normal range and within 360 days    HDL  Date Value Ref Range Status  11/16/2018 51 >39 mg/dL Final         Passed - Triglycerides in normal range and within 360 days    Triglycerides  Date Value Ref Range Status  11/16/2018 94 0 - 149 mg/dL Final         Passed - Patient is not pregnant      Passed - Valid encounter within last 12 months    Recent Outpatient Visits          2 months ago Essential hypertension, benign   [facility], Lilia Argue, MD   2 months ago Chronic neck pain   [place], [place], DO   2 months ago Essential hypertension, benign   [facility], Lilia Argue, MD   2 months ago Vitamin D deficiency disease   [place], Thomas L, MD   4 months ago Essential hypertension, benign   [place], Marvis Repress, FNP      Future Appointments            Today Rutherford Guys, MD Primary Care at [place], [facility]

## 2019-01-20 NOTE — Patient Instructions (Signed)
° ° ° °  If you have lab work done today you will be contacted with your lab results within the next 2 weeks.  If you have not heard from us then please contact us. The fastest way to get your results is to register for My Chart. ° ° °IF you received an x-ray today, you will receive an invoice from [facility]. Please contact  Radiology at [phone] with questions or concerns regarding your invoice.  ° °IF you received labwork today, you will receive an invoice from [facility]. Please contact [facility] at [phone] with questions or concerns regarding your invoice.  ° °Our billing staff will not be able to assist you with questions regarding bills from these companies. ° °You will be contacted with the lab results as soon as they are available. The fastest way to get your results is to activate your My Chart account. Instructions are located on the last page of this paperwork. If you have not heard from us regarding the results in 2 weeks, please contact this office. °  ° ° ° °

## 2019-01-20 NOTE — Progress Notes (Signed)
2/7/20203:27 PM  Paula Meyer [birth year], 68 y.o. female [id number]  Chief Complaint  Patient presents with  . Hypertension    went to specialty doctors , here to discuss results  . Medication Refill    albuterol, tramodol, and wants to talk about the bystolic medication. Makes here fatigue    HPI:   Patient is a 68 y.o. female with past medical history significant for HTN, asthma, OA, HLP, GERD, vitamin D deficiency, anxiety who presents today for routine followup  EGD bx - peptic duodenitis On PPI and fdgard Getting better, mild nausea  Sees GI next week  Patient would like to stop bystolic as it gets her tired She denies any h/o MI or CHF Reports pulse in the 50s  Has seen ortho for cervical radiculopathy Completed PT and now doing HEP Requesting refill of tramadol, takes very prn for various joint pains, specially when its cold and rainy as it has been of recent pmp reviewed  Last vitamin d 30  BP Readings from Last 3 Encounters:  01/20/19 128/76  12/16/18 137/68  11/16/18 (!) 144/80   Wt Readings from Last 3 Encounters:  01/20/19 206 lb (93.4 kg)  12/16/18 212 lb (96.2 kg)  11/16/18 212 lb 6.4 oz (96.3 kg)   Lab Results  Component Value Date   CHOL 198 11/16/2018   HDL 51 11/16/2018   LDLCALC 128 (H) 11/16/2018   LDLDIRECT 188.6 07/10/2013   TRIG 94 11/16/2018   CHOLHDL 3.9 11/16/2018    Fall Risk  01/20/2019 11/16/2018 10/29/2018 02/23/2018 01/12/2018  Falls in the past year? 0 0 0 No No  Number falls in past yr: 0 - - - -  Injury with Fall? 0 - - - -     Depression screen [facility] 2/9 01/20/2019 11/16/2018 10/29/2018  Decreased Interest 0 0 1  Down, Depressed, Hopeless 0 0 0  PHQ - 2 Score 0 0 1  Altered sleeping - - 0  Tired, decreased energy - - 3  Change in appetite - - 2  Feeling bad or failure about yourself  - - 0  Trouble concentrating - - 0  Moving slowly or fidgety/restless - - 0  Suicidal thoughts - - 0  PHQ-9 Score - - 6  Difficult  doing work/chores - - Not difficult at all    Allergies  Allergen Reactions  . Paxil [Paroxetine Hcl]     Makes her grind her teeth  . Bupropion Hcl Other (See Comments)    palpitations  . Contrast Media [Iodinated Diagnostic Agents] Hives and Itching  . Lisinopril     cough  . Tetracycline Nausea Only    Prior to Admission medications   Medication Sig Start Date End Date Taking? Authorizing Provider  albuterol (PROVENTIL HFA;VENTOLIN HFA) 108 (90 Base) MCG/ACT inhaler Inhale 2 puffs into the lungs every 4 (four) hours as needed for wheezing or shortness of breath (cough, shortness of breath or wheezing.). 09/02/16  Yes English, Stephanie D, PA  ALPRAZolam (XANAX) 0.5 MG tablet Take 1 tablet (0.5 mg total) by mouth 2 (two) times daily as needed for anxiety. 08/23/18  Yes [name], MD  aspirin EC 81 MG tablet Take 1 tablet (81 mg total) by mouth daily. 08/24/18  Yes [name], MD  Azelastine HCl 0.15 % SOLN USE 1 SPRAY IN THE NOSE TWICE DAILY 09/28/18  Yes [name], MD  diclofenac sodium (VOLTAREN) 1 % GEL Apply 4 g topically 4 (four) times daily.  02/23/18  Yes Tereasa Coop, PA-C  DULoxetine (CYMBALTA) 20 MG capsule Take 1 capsule (20 mg total) by mouth daily. 11/21/18  Yes Lyndal Pulley, DO  hydrochlorothiazide (HYDRODIURIL) 25 MG tablet Take 1 tablet (25 mg total) by mouth daily. 11/16/18  Yes Rutherford Guys, MD  MIMVEY 1-0.5 MG tablet Take 1 tablet by mouth daily. prn 06/17/16  Yes [provider]  nebivolol (BYSTOLIC) 10 MG tablet Take 1 tablet (10 mg total) by mouth daily. 10/24/18  Yes [name], MD  rosuvastatin (CRESTOR) 10 MG tablet TAKE 1 TABLET BY MOUTH EVERY DAY 01/20/19  Yes Rutherford Guys, MD  telmisartan (MICARDIS) 40 MG tablet Take 1 tablet (40 mg total) by mouth daily. 11/16/18  Yes Rutherford Guys, MD  traMADol (ULTRAM) 50 MG tablet Take 1 tablet (50 mg total) by mouth every 12 (twelve) hours as needed. 01/15/19  Yes Rutherford Guys, MD    Turmeric 500 MG CAPS Take 1 capsule by mouth daily. 08/23/18  Yes [name], MD  Vitamin D, Ergocalciferol, (DRISDOL) 50000 units CAPS capsule Take 1 capsule (50,000 Units total) by mouth every 7 (seven) days. 08/24/18  Yes [name], MD    Past Medical History:  Diagnosis Date  . Allergy   . Anxiety   . Asthma   . Depression   . Hx of colonic polyps   . Hyperlipidemia   . Hypertension   . Osteoarthritis     Past Surgical History:  Procedure Laterality Date  . TUBAL LIGATION      Social History   Tobacco Use  . Smoking status: Former Research scientist (life sciences)  . Smokeless tobacco: Never Used  Substance Use Topics  . Alcohol use: Yes    Alcohol/week: 3.0 standard drinks    Types: 3 Glasses of wine per week    Comment: rarely    Family History  Problem Relation Age of Onset  . Hyperlipidemia Brother   . Hypertension Brother   . Arthritis Other   . Depression Other   . Colon cancer Neg Hx   . Stomach cancer Neg Hx     Review of Systems  Constitutional: Negative for chills and fever.  Respiratory: Negative for cough and shortness of breath.   Cardiovascular: Negative for chest pain, palpitations and leg swelling.  Gastrointestinal: Positive for nausea. Negative for abdominal pain, heartburn and vomiting.  Musculoskeletal: Positive for joint pain and neck pain.  per hpi   OBJECTIVE:  Blood pressure 128/76, pulse 65, temperature 98.8 F (37.1 C), temperature source Oral, height 5\' 5"  (1.651 m), weight 206 lb (93.4 kg), last menstrual period 04/02/2011, SpO2 96 %. Body mass index is 34.28 kg/m.   Physical Exam Vitals signs and nursing note reviewed.  Constitutional:      Appearance: She is well-developed.  HENT:     Head: Normocephalic and atraumatic.     Mouth/Throat:     Pharynx: No oropharyngeal exudate.  Eyes:     General: No scleral icterus.    Conjunctiva/sclera: Conjunctivae normal.     Pupils: Pupils are equal, round, and reactive to light.  Neck:      Musculoskeletal: Neck supple.  Cardiovascular:     Rate and Rhythm: Normal rate and regular rhythm.     Heart sounds: Normal heart sounds. No murmur. No friction rub. No gallop.   Pulmonary:     Effort: Pulmonary effort is normal.     Breath sounds: Normal breath sounds. No wheezing or rales.  Musculoskeletal:     Right lower leg: No edema.     Left lower leg: No edema.  Skin:    General: Skin is warm and dry.  Neurological:     Mental Status: She is alert and oriented to person, place, and time.     ASSESSMENT and PLAN  1. Essential hypertension, benign Controlled. Continue current regime.    2. Vitamin D deficiency disease Vitamin d low end of normal. Start daily OTC supplementation  3. Hyperlipidemia with target LDL less than 130 Improved. Cont current regime. Cont with LFM  4. Gastroesophageal reflux disease, esophagitis presence not specified Improved. Keep upcoming appt with GI  5. BMI 34.0-34.9,adult Congratulated patient on recent weight loss. Cont LFM  6. Pain in both knees, unspecified chronicity 7. Hip pain, left  pmp reviewed. Cont with prn use. Reviewed r/se/b.  - traMADol (ULTRAM) 50 MG tablet; Take 1 tablet (50 mg total) by mouth every 12 (twelve) hours as needed.   Other orders - amLODipine (NORVASC) 5 MG tablet; Take 1 tablet (5 mg total) by mouth daily. - albuterol (PROVENTIL HFA;VENTOLIN HFA) 108 (90 Base) MCG/ACT inhaler; Inhale 2 puffs into the lungs every 4 (four) hours as needed for wheezing or shortness of breath (cough, shortness of breath or wheezing.).  Return in about 3 months (around 04/20/2019) for HTN, HLP, weight.    Rutherford Guys, MD Primary Care at [place], [place] [address] Ph.  [phone] Fax [phone]

## 2019-01-27 ENCOUNTER — Ambulatory Visit (INDEPENDENT_AMBULATORY_CARE_PROVIDER_SITE_OTHER): Payer: Medicare Other | Admitting: Nurse Practitioner

## 2019-01-27 ENCOUNTER — Other Ambulatory Visit (INDEPENDENT_AMBULATORY_CARE_PROVIDER_SITE_OTHER): Payer: Medicare Other

## 2019-01-27 ENCOUNTER — Encounter: Payer: Self-pay | Admitting: Nurse Practitioner

## 2019-01-27 VITALS — BP 126/72 | HR 88 | Ht 63.75 in | Wt 205.0 lb

## 2019-01-27 DIAGNOSIS — R110 Nausea: Secondary | ICD-10-CM

## 2019-01-27 DIAGNOSIS — K5909 Other constipation: Secondary | ICD-10-CM

## 2019-01-27 DIAGNOSIS — R634 Abnormal weight loss: Secondary | ICD-10-CM | POA: Diagnosis not present

## 2019-01-27 LAB — LIPASE: Lipase: 12 U/L (ref 11.0–59.0)

## 2019-01-27 MED ORDER — ONDANSETRON HCL 4 MG PO TABS: 4.0000 mg | ORAL_TABLET | Freq: Three times a day (TID) | ORAL | 1 refills | Status: DC | PRN

## 2019-01-27 NOTE — Patient Instructions (Addendum)
If you are age 66 or older, your body mass index should be between 23-30. Your Body mass index is 35.46 kg/m. If this is out of the aforementioned range listed, please consider follow up with your Primary Care Provider.  If you are age 32 or younger, your body mass index should be between 19-25. Your Body mass index is 35.46 kg/m. If this is out of the aformentioned range listed, please consider follow up with your Primary Care Provider.   We have sent the following medications to your pharmacy for you to pick up at your convenience: Zofran   STOP Omeprazole. STOP FDgard. Start Pepcid 20 mg before breakfast.  Increase Miralax to twice daily.  Call us with an update in a couple of weeks.  Thank you for choosing me and [facility].   [name], NP

## 2019-01-27 NOTE — Progress Notes (Signed)
Chief Complaint:    nausea  IMPRESSION and PLAN:    2.  68 year old female with several month history of nausea and now describing a vague 'achy' feeling in LUQ.  ED with biopsies revealing of gastritis and duodenitis.  No H. Pylori.  Patient is back with persistent nausea and vague achy discomfort in the left upper abdomen.  She also complains of some ongoing constipation.  -Trial of Zofran for nausea -Stop PPI, she feels it dries out her skin -she can stop FDGard -Trial of Pepcid 20 mg before breakfast.  I would like to keep her on an acid blocker for thetime being given gastritis /duodenitis -obtain serum lipase -We will treat her constipation (see #2)  and see if it helps symptoms.  Once bowels are moving well patient will refrain from using Zofran to see if the nausea/achy feeling persists.  She will call us with a condition update in a few weeks. If symptoms persist then I am going to schedule her for CT scan of the abdomen.    2.  Chronic constipation.  She cannot tolerate fiber due to gas and bloating.  I started her on MiraLAX daily, she is having a bowel movement 3-4 times a week but would like to have onedaily. -Advised patient to increase MiraLAX to twice daily -If no improvement, consider Amitiza  3. Weight loss, she says this has been intentional.  Changed to diet recently to see if it would help with her GI symptoms.  She was to 214 pounds early December, now at 205 pounds.   4. History of colon polyps, she is due for 5 year surveillance colonoscopy November 2023     HPI:     Patient is a 68 yo female with a hx of constipation, diverticulosis, adenomatous colon polyps. She is known to Dr. Fuller Plan. I saw her in mid December for evaluation of nausea which had improved but not resolved after starting a PPI.  I scheduled her for an EGD, she had this done 12/16/2018 with findings as below.  Basically there was gastritis and duodenitis.  Recommendations were for daily  omeprazole and FD guard for 1 month.  Patient is here for follow-up   EGD 12/16/18 Normal esophagus. - Erythematous mucosa in the gastric body. Biopsied. - Mucosal nodule found in the second duodenum. Biopsied. - Mucosal nodules found in the duodenal bulb. Biopsied. Impression: - Resume previous diet. - Continue present medications. - Await pathology results. - Take omeprazole 40 mg po daily every day for 1 month (not prn) - FDgard 1-2 po tid ac for 1 month (not prn)  Diagnosis 1. Surgical [P], descending duodenal nodules - PEPTIC DUODENITIS. - NO DYSPLASIA OR MALIGNANCY. 2. Surgical [P], duodenal bulb nodule - PEPTIC DUODENITIS. - NO DYSPLASIA OR MALIGNANCY. 3. Surgical [P], gastric body - MILD CHRONIC GASTRITIS. - WARTHIN-STARRY IS NEGATIVE FOR HELICOBACTER PYLORI. - NO INTESTINAL METAPLASIA, DYSPLASIA, OR MALIGNANCY  Paula Meyer is back with recurrent nausea. FDGard prescribed at time of EGD worked well but then lost efficacy after 2 weeks.  Nausea is a daily occurrence and she also describes achy discomfort involving LUQ and left mid abdomen.  Symptoms do not improve with bowel movements though patient still feels constipated.  The achy discomfort does not radiate through to the back but there is some involvement with her left flank area.  Tramadol helps the pain . She has also noticed that eating temporarily helps the nausea.  She does not like taking  omeprazole, as it makes her skin dry.  She denies any history of GERD symptoms and would like to stop the PPI.  The only medication she started close to the time nausea began was vitamin D2.   Patient chronic constipation.  I put her on MiraLAX in December, she has not noticed a whole lot of improvement.  She has a bowel movement every 3-4 times a week but would like to have 1 daily.  She cannot tolerate fiber due to gas/bloating.    Review of systems:     No chest pain, no SOB, no fevers, no urinary sx   Past Medical History:    Diagnosis Date  . Allergy   . Anxiety   . Asthma   . Depression   . Hx of colonic polyps   . Hyperlipidemia   . Hypertension   . Osteoarthritis     Patient's surgical history, family medical history, social history, medications and allergies were all reviewed in Epic   Creatinine clearance cannot be calculated (Patient's most recent lab result is older than the maximum 21 days allowed.)  Current Outpatient Medications  Medication Sig Dispense Refill  . albuterol (PROVENTIL HFA;VENTOLIN HFA) 108 (90 Base) MCG/ACT inhaler Inhale 2 puffs into the lungs every 4 (four) hours as needed for wheezing or shortness of breath (cough, shortness of breath or wheezing.). 1 Inhaler 1  . ALPRAZolam (XANAX) 0.5 MG tablet Take 1 tablet (0.5 mg total) by mouth 2 (two) times daily as needed for anxiety. 180 tablet 1  . amLODipine (NORVASC) 5 MG tablet Take 1 tablet (5 mg total) by mouth daily. 90 tablet 1  . aspirin EC 81 MG tablet Take 1 tablet (81 mg total) by mouth daily. 90 tablet 1  . Azelastine HCl 0.15 % SOLN USE 1 SPRAY IN THE NOSE TWICE DAILY 90 mL 1  . Caraway Oil-Levomenthol (FDGARD PO) Take 1 tablet by mouth daily.    . diclofenac sodium (VOLTAREN) 1 % GEL Apply 4 g topically 4 (four) times daily. 100 g 0  . DULoxetine (CYMBALTA) 20 MG capsule Take 1 capsule (20 mg total) by mouth daily. 30 capsule 3  . hydrochlorothiazide (HYDRODIURIL) 25 MG tablet Take 1 tablet (25 mg total) by mouth daily. 90 tablet 1  . MIMVEY 1-0.5 MG tablet Take 1 tablet by mouth daily. prn  3  . omeprazole (PRILOSEC) 40 MG capsule Take 40 mg by mouth daily.    . rosuvastatin (CRESTOR) 10 MG tablet TAKE 1 TABLET BY MOUTH EVERY DAY 90 tablet 0  . telmisartan (MICARDIS) 40 MG tablet Take 1 tablet (40 mg total) by mouth daily. 90 tablet 1  . traMADol (ULTRAM) 50 MG tablet Take 1 tablet (50 mg total) by mouth every 12 (twelve) hours as needed. 30 tablet 0  . Turmeric 500 MG CAPS Take 1 capsule by mouth daily. 90 capsule 1   . Vitamin D, Ergocalciferol, (DRISDOL) 50000 units CAPS capsule Take 1 capsule (50,000 Units total) by mouth every 7 (seven) days. 12 capsule 1   No current facility-administered medications for this visit.     Physical Exam:     BP 126/72 (BP Location: Left Arm, Patient Position: Sitting, Cuff Size: Normal)   Pulse 88   Ht 5' 3.75" (1.619 m) Comment: height measured without shoes  Wt 205 lb (93 kg)   LMP 04/02/2011   BMI 35.46 kg/m   GENERAL:  Pleasant female in NAD PSYCH: : Cooperative, normal affect EENT:  conjunctiva pink, mucous  membranes moist, neck supple without masses CARDIAC:  RRR,  CTA bilaterally, no wheezing ABDOMEN:  Nondistended, soft, nontender. No obvious masses, no hepatomegaly,  normal bowel sounds SKIN:  turgor, no lesions seen Musculoskeletal:  Normal muscle tone, normal strength NEURO: Alert and oriented x 3, no focal neurologic deficits   [name] , NP 01/27/2019, 1:35 PM

## 2019-01-29 NOTE — Progress Notes (Signed)
Reviewed and agree with management plan.  Chera Slivka T. Hisayo Delossantos, MD FACG 

## 2019-02-10 ENCOUNTER — Telehealth: Payer: Self-pay | Admitting: Nurse Practitioner

## 2019-02-10 NOTE — Telephone Encounter (Signed)
Pt call and stated that she had to start back taking the omeprazole because of the nausea. And to call back to notify is she will like to sched a MRI. Also the miralax is working fine.

## 2019-02-13 NOTE — Telephone Encounter (Signed)
Continues to have nausea. She does want to go forward with imaging. She is having daily bowel movements without difficulty.  Okay to order CT abdomen only?

## 2019-02-14 ENCOUNTER — Other Ambulatory Visit: Payer: Self-pay

## 2019-02-14 NOTE — Telephone Encounter (Signed)
Paula Meyer, if her bowels are moving well on MiraLAX and she is taking the PPI and still having the LUQ pain then yes let us proceed with CT scan of the abdomen with contrast.  Thanks

## 2019-02-14 NOTE — Telephone Encounter (Signed)
She has a contrast allergy. Do you want to use a pre-medication regime for contrast allergy?

## 2019-02-14 NOTE — Telephone Encounter (Signed)
Yes, please have her follow the pre-medication protocol. Do you have a copy of the prednisone / benadryl regimen? Thanks

## 2019-02-15 ENCOUNTER — Other Ambulatory Visit: Payer: Self-pay

## 2019-02-15 ENCOUNTER — Other Ambulatory Visit (INDEPENDENT_AMBULATORY_CARE_PROVIDER_SITE_OTHER): Payer: Medicare Other

## 2019-02-15 DIAGNOSIS — R1084 Generalized abdominal pain: Secondary | ICD-10-CM

## 2019-02-15 LAB — CREATININE, SERUM: Creatinine, Ser: 0.88 mg/dL (ref 0.40–1.20)

## 2019-02-15 LAB — BUN: BUN: 19 mg/dL (ref 6–23)

## 2019-02-15 MED ORDER — PREDNISONE 50 MG PO TABS: ORAL_TABLET | ORAL | 0 refills | Status: DC

## 2019-02-15 MED ORDER — DIPHENHYDRAMINE HCL 50 MG PO TABS: ORAL_TABLET | ORAL | 0 refills | Status: DC

## 2019-02-15 NOTE — Telephone Encounter (Signed)
Discussed in detail with the patient.  She will go for her labs today and pick up her instructions and contrast at the front desk.  See "letters" for details on her instructions. Pre-procedure medications sent to the pharmacy of her choice. CT abdomen 02/20/19 at [facility].

## 2019-02-19 ENCOUNTER — Other Ambulatory Visit: Payer: Self-pay | Admitting: Internal Medicine

## 2019-02-19 DIAGNOSIS — E785 Hyperlipidemia, unspecified: Secondary | ICD-10-CM

## 2019-02-20 ENCOUNTER — Ambulatory Visit (HOSPITAL_COMMUNITY)
Admission: RE | Admit: 2019-02-20 | Discharge: 2019-02-20 | Disposition: A | Discharge status: Home / Self Care | Payer: Medicare Other | Source: Ambulatory Visit | Attending: Nurse Practitioner | Admitting: Nurse Practitioner

## 2019-02-20 DIAGNOSIS — R1084 Generalized abdominal pain: Secondary | ICD-10-CM | POA: Diagnosis not present

## 2019-02-20 DIAGNOSIS — K5730 Diverticulosis of large intestine without perforation or abscess without bleeding: Secondary | ICD-10-CM | POA: Diagnosis not present

## 2019-02-20 MED ORDER — SODIUM CHLORIDE (PF) 0.9 % IJ SOLN
INTRAMUSCULAR | Status: AC
  Filled 2019-02-20: qty 50

## 2019-02-20 MED ORDER — IOHEXOL 300 MG/ML  SOLN
100.0000 mL | Freq: Once | INTRAMUSCULAR | Status: AC | PRN
  Administered 2019-02-20: 100 mL via INTRAVENOUS

## 2019-02-23 ENCOUNTER — Other Ambulatory Visit: Payer: Self-pay

## 2019-02-24 ENCOUNTER — Other Ambulatory Visit: Payer: Self-pay | Admitting: Internal Medicine

## 2019-02-24 DIAGNOSIS — E559 Vitamin D deficiency, unspecified: Secondary | ICD-10-CM

## 2019-04-21 ENCOUNTER — Ambulatory Visit: Payer: BC Managed Care – PPO | Admitting: Family Medicine

## 2019-05-07 ENCOUNTER — Other Ambulatory Visit: Payer: Self-pay | Admitting: Family Medicine

## 2019-05-14 ENCOUNTER — Other Ambulatory Visit: Payer: Self-pay | Admitting: Family Medicine

## 2019-05-30 ENCOUNTER — Ambulatory Visit: Payer: BC Managed Care – PPO | Admitting: Family Medicine

## 2019-06-16 ENCOUNTER — Telehealth: Payer: BC Managed Care – PPO | Admitting: Family Medicine

## 2019-06-20 ENCOUNTER — Telehealth (INDEPENDENT_AMBULATORY_CARE_PROVIDER_SITE_OTHER): Payer: Medicare Other | Admitting: Registered Nurse

## 2019-06-20 ENCOUNTER — Ambulatory Visit: Payer: Self-pay | Admitting: Family Medicine

## 2019-06-20 ENCOUNTER — Other Ambulatory Visit: Payer: Self-pay

## 2019-06-20 ENCOUNTER — Ambulatory Visit: Payer: BC Managed Care – PPO

## 2019-06-20 DIAGNOSIS — R0602 Shortness of breath: Secondary | ICD-10-CM | POA: Diagnosis not present

## 2019-06-20 MED ORDER — AZITHROMYCIN 250 MG PO TABS: ORAL_TABLET | ORAL | 0 refills | Status: DC

## 2019-06-20 NOTE — Telephone Encounter (Signed)
Pt states has been having to use her inhalers more often x 3-4 months. States did not need to ise yesterday, did use this morning and helped. States this am she woke up with "A soreness where my left lung would be, in my back."  Worsens with movement. Does not radiate, no worse with inspiration. States she had GI referral months ago and they found a spot on my left lung." Pt states she had appt 06/14/2019 that was cancelled by practice. States "I just want to consult with Dr. Pamella Pert about all this." [place] called practice, call transferred for consideration of appt.  Reason for Disposition . [1] Longstanding difficulty breathing (e.g., CHF, COPD, emphysema) AND [2] WORSE than normal  Answer Assessment - Initial Assessment Questions 1. RESPIRATORY STATUS: "Describe your breathing?" (e.g., wheezing, shortness of breath, unable to speak, severe coughing)      SOB, using inhalers more often 2. ONSET: "When did this breathing problem begin?"      3-4 months ago 3. PATTERN "Does the difficult breathing come and go, or has it been constant since it started?"      intermittent 4. SEVERITY: "How bad is your breathing?" (e.g., mild, moderate, severe)    - MILD: No SOB at rest, mild SOB with walking, speaks normally in sentences, can lay down, no retractions, pulse < 100.    - MODERATE: SOB at rest, SOB with minimal exertion and prefers to sit, cannot lie down flat, speaks in phrases, mild retractions, audible wheezing, pulse 100-120.    - SEVERE: Very SOB at rest, speaks in single words, struggling to breathe, sitting hunched forward, retractions, pulse > 120      Mild-moderate 5. RECURRENT SYMPTOM: "Have you had difficulty breathing before?" If so, ask: "When was the last time?" and "What happened that time?"      At times, using ihalers 6. CARDIAC HISTORY: "Do you have any history of heart disease?" (e.g., heart attack, angina, bypass surgery, angioplasty)      *No Answer* 7. LUNG HISTORY: "Do you have any  history of lung disease?"  (e.g., pulmonary embolus, asthma, emphysema)     asthma 8. CAUSE: "What do you think is causing the breathing problem?"      Asthma 9. OTHER SYMPTOMS: "Do you have any other symptoms? (e.g., dizziness, runny nose, cough, chest pain, fever)    Left mid back "Tight" worse with movement  Protocols used: BREATHING DIFFICULTY-A-AH

## 2019-06-20 NOTE — Progress Notes (Signed)
Telemedicine Encounter- SOAP NOTE Established Patient  This telephone encounter was conducted with the patient's (or proxy's) verbal consent via audio telecommunications: yes   Patient was instructed to have this encounter in a suitably private space; and to only have persons present to whom they give permission to participate. In addition, patient identity was confirmed by use of name plus two identifiers (DOB and address).  I discussed the limitations, risks, security and privacy concerns of performing an evaluation and management service by telephone and the availability of in person appointments. I also discussed with the patient that there may be a patient responsible charge related to this service. The patient expressed understanding and agreed to proceed.  I spent a total of 16 minutes talking with the patient or their proxy.  No chief complaint on file.   Subjective   Paula Meyer is a 68 y.o. established patient. Telephone visit today for shortness of breath  HPI Paula Meyer has noticed that her shortness of breath has increased in the preceding weeks. She states that she has noticed it in the past, though she has a history of asthma. She uses an albuterol inhaler intermittently with good effect.  She states these episodes of SOB are not always related to activity - they have happened when talking or at rest. She had an abdominal CT in march that showed a granuloma in the left lower lobe, otherwise normal.  She states that she has had some swelling in her feet, but this is not new. She notices it more when she misses her diuretic. She denies crushing chest pain, headache, LOC, GI upset.  She does endorse some pain on her L side to back, worse with inspiration.   Patient Active Problem List   Diagnosis Date Noted  . Frozen shoulder 11/02/2018  . Degenerative disc disease, cervical 11/02/2018  . Nausea in adult 10/24/2018  . Chronic right shoulder pain 10/24/2018  .  Constipation 08/24/2018  . Vitamin D deficiency disease 08/23/2018  . Morbid obesity ([place]) 03/17/2018  . Seasonal affective disorder ([place]) 11/03/2017  . GAD (generalized anxiety disorder) 11/03/2017  . Other screening mammogram 07/10/2013  . B12 deficiency anemia 08/24/2012  . GERD (gastroesophageal reflux disease) 06/30/2012  . Eczema 05/04/2012  . DDD (degenerative disc disease), lumbar 04/02/2011  . Hyperlipidemia with target LDL less than 130 10/20/2010  . Depression with anxiety 10/20/2010  . Essential hypertension, benign 10/20/2010  . ALLERGIC RHINITIS 10/20/2010  . Mild persistent asthma 10/20/2010  . Osteoarthritis 10/20/2010    Past Medical History:  Diagnosis Date  . Allergy   . Anxiety   . Asthma   . Depression   . Hx of colonic polyps   . Hyperlipidemia   . Hypertension   . Osteoarthritis     Current Outpatient Medications  Medication Sig Dispense Refill  . albuterol (PROVENTIL HFA;VENTOLIN HFA) 108 (90 Base) MCG/ACT inhaler Inhale 2 puffs into the lungs every 4 (four) hours as needed for wheezing or shortness of breath (cough, shortness of breath or wheezing.). 1 Inhaler 1  . ALPRAZolam (XANAX) 0.5 MG tablet Take 1 tablet (0.5 mg total) by mouth 2 (two) times daily as needed for anxiety. 180 tablet 1  . amLODipine (NORVASC) 5 MG tablet Take 1 tablet (5 mg total) by mouth daily. 90 tablet 1  . aspirin EC 81 MG tablet Take 1 tablet (81 mg total) by mouth daily. 90 tablet 1  . Azelastine HCl 0.15 % SOLN USE 1 SPRAY IN THE  NOSE TWICE DAILY 90 mL 1  . azithromycin (ZITHROMAX) 250 MG tablet Take 2 tabs on first day (500mg ) followed by 1 tab on days two-five (250mg  each day). 6 each 0  . Caraway Oil-Levomenthol (FDGARD PO) Take 1 tablet by mouth daily.    . diclofenac sodium (VOLTAREN) 1 % GEL Apply 4 g topically 4 (four) times daily. 100 g 0  . diphenhydrAMINE (BENADRYL) 50 MG tablet Take 1 hr before test 1 tablet 0  . DULoxetine (CYMBALTA) 20 MG capsule Take 1  capsule (20 mg total) by mouth daily. 30 capsule 3  . hydrochlorothiazide (HYDRODIURIL) 25 MG tablet TAKE 1 TABLET BY MOUTH EVERY DAY 90 tablet 1  . MIMVEY 1-0.5 MG tablet Take 1 tablet by mouth daily. prn  3  . omeprazole (PRILOSEC) 40 MG capsule Take 40 mg by mouth daily.    . ondansetron (ZOFRAN) 4 MG tablet Take 1 tablet (4 mg total) by mouth every 8 (eight) hours as needed for nausea or vomiting. 30 tablet 1  . predniSONE (DELTASONE) 50 MG tablet Take 1 tablet 13 hrs then 7 hrs then 1 hr before test 3 tablet 0  . rosuvastatin (CRESTOR) 10 MG tablet TAKE 1 TABLET BY MOUTH EVERY DAY 90 tablet 0  . telmisartan (MICARDIS) 40 MG tablet TAKE 1 TABLET BY MOUTH EVERY DAY 30 tablet 5  . traMADol (ULTRAM) 50 MG tablet Take 1 tablet (50 mg total) by mouth every 12 (twelve) hours as needed. 30 tablet 0  . Turmeric 500 MG CAPS Take 1 capsule by mouth daily. 90 capsule 1  . Vitamin D, Ergocalciferol, (DRISDOL) 50000 units CAPS capsule Take 1 capsule (50,000 Units total) by mouth every 7 (seven) days. 12 capsule 1   No current facility-administered medications for this visit.     Allergies  Allergen Reactions  . Paxil [Paroxetine Hcl]     Makes her grind her teeth  . Bupropion Hcl Other (See Comments)    palpitations  . Contrast Media [Iodinated Diagnostic Agents] Hives and Itching  . Lisinopril     cough  . Tetracycline Nausea Only    Social History   Socioeconomic History  . Marital status: Divorced    Spouse name: Not on file  . Number of children: Not on file  . Years of education: Not on file  . Highest education level: Not on file  Occupational History  . Not on file  Social Needs  . Financial resource strain: Not on file  . Food insecurity    Worry: Not on file    Inability: Not on file  . Transportation needs    Medical: Not on file    Non-medical: Not on file  Tobacco Use  . Smoking status: Former Research scientist (life sciences)  . Smokeless tobacco: Never Used  Substance and Sexual Activity   . Alcohol use: Yes    Alcohol/week: 3.0 standard drinks    Types: 3 Glasses of wine per week    Comment: rarely  . Drug use: No  . Sexual activity: Not Currently  Lifestyle  . Physical activity    Days per week: Not on file    Minutes per session: Not on file  . Stress: Not on file  Relationships  . Social Herbalist on phone: Not on file    Gets together: Not on file    Attends religious service: Not on file    Active member of club or organization: Not on file    Attends  meetings of clubs or organizations: Not on file    Relationship status: Not on file  . Intimate partner violence    Fear of current or ex partner: Not on file    Emotionally abused: Not on file    Physically abused: Not on file    Forced sexual activity: Not on file  Other Topics Concern  . Not on file  Social History Narrative  . Not on file    Review of Systems  Constitutional: Negative for chills, fever and malaise/fatigue.  HENT: Negative.   Eyes: Negative.   Respiratory: Positive for cough, sputum production and shortness of breath. Negative for hemoptysis and wheezing.   Cardiovascular: Negative.   Gastrointestinal: Negative.   Genitourinary: Negative.   Musculoskeletal: Negative.   Skin: Negative.   Neurological: Negative.   Endo/Heme/Allergies: Negative.   Psychiatric/Behavioral: Negative.     Objective   Vitals as reported by the patient: There were no vitals filed for this visit.  Diagnoses and all orders for this visit:  Shortness of breath -     azithromycin (ZITHROMAX) 250 MG tablet; Take 2 tabs on first day (500mg ) followed by 1 tab on days two-five (250mg  each day). -     DG Chest 2 View; Future   PLAN:  SOB and pain r/t inspiration: pain sounds pleuritic in nature. There have been some cases of COVID-19 with secondary pleuritic pain - as such, she should be tested. Differentials would include CAP, bronchitis, asthma exacerbation, anxiety.  No urgent cardiac  concerns, but she would warrant close follow up   Azithromycin 250mg  PO bid on first day, qd for next four days.   CXR to follow up on granuloma, possible PNA  Follow up in 2 weeks, sooner if symptoms worsen or fail to improve.  Patient encouraged to call clinic with any questions, comments, or concerns.     I discussed the assessment and treatment plan with the patient. The patient was provided an opportunity to ask questions and all were answered. The patient agreed with the plan and demonstrated an understanding of the instructions.   The patient was advised to call back or seek an in-person evaluation if the symptoms worsen or if the condition fails to improve as anticipated.  I provided 16 minutes of non-face-to-face time during this encounter.  Maximiano Coss, NP  Primary Care at [facility]

## 2019-06-22 ENCOUNTER — Telehealth: Payer: Self-pay

## 2019-06-22 ENCOUNTER — Telehealth: Payer: Self-pay | Admitting: Family Medicine

## 2019-06-22 ENCOUNTER — Other Ambulatory Visit: Payer: Medicare Other

## 2019-06-22 DIAGNOSIS — Z20822 Contact with and (suspected) exposure to covid-19: Secondary | ICD-10-CM

## 2019-06-22 DIAGNOSIS — R6889 Other general symptoms and signs: Secondary | ICD-10-CM | POA: Diagnosis not present

## 2019-06-22 NOTE — Addendum Note (Signed)
Addended by: Denyce Robert on: 06/22/2019 12:48 PM   Modules accepted: Orders

## 2019-06-22 NOTE — Telephone Encounter (Signed)
Patient stated she was supposed to have orders sent over for COVID testing stated she was told on Tuesday that the order was sent over  Also stated she was supposed to get an Xray order and nobody has called her yet , so she wants to come in and have her Xray done.   She would like a Call Back ASAP  [phone]

## 2019-06-22 NOTE — Telephone Encounter (Signed)
Please schedule for COVID testing per 06/20/2019 OV with c/o SOB.

## 2019-06-22 NOTE — Telephone Encounter (Signed)
Scheduled patient for COVID 19 test today at [facility].  Testing protocol reviewed with patient.

## 2019-06-22 NOTE — Telephone Encounter (Signed)
Hi Rich,   [facility] will not perform this x-ray until her COVID test shows negative.  When/if it is negative, we'll have to let the pt know to go in for xray.  I've made the patient aware.    Thank you,  Wilfred Curtis, RN

## 2019-06-23 NOTE — Telephone Encounter (Signed)
Spoke with pt about her concerns and she informed me that she has already went for testing and she will have to call and schedule her xray when her results come back, I verbalized understanding.

## 2019-06-26 LAB — NOVEL CORONAVIRUS, NAA: SARS-CoV-2, NAA: NOT DETECTED

## 2019-06-27 ENCOUNTER — Telehealth: Payer: Self-pay | Admitting: Registered Nurse

## 2019-06-27 NOTE — Telephone Encounter (Signed)
Called to follow up on SOB COVID test negative SOB resolved after finishing Azithromycin No current complaints. Pt will follow up with PCP Dr. Pamella Pert - she is requesting a chest Xray to follow up on previous imaging showing granuloma in chest.  Kathrin Ruddy, NP

## 2019-07-06 ENCOUNTER — Encounter: Payer: Self-pay | Admitting: Family Medicine

## 2019-07-06 ENCOUNTER — Ambulatory Visit (INDEPENDENT_AMBULATORY_CARE_PROVIDER_SITE_OTHER): Payer: Medicare Other | Admitting: Family Medicine

## 2019-07-06 ENCOUNTER — Other Ambulatory Visit: Payer: Self-pay

## 2019-07-06 ENCOUNTER — Ambulatory Visit (INDEPENDENT_AMBULATORY_CARE_PROVIDER_SITE_OTHER): Payer: Medicare Other

## 2019-07-06 VITALS — BP 114/78 | HR 74 | Temp 98.6°F | Ht 64.0 in | Wt 199.2 lb

## 2019-07-06 DIAGNOSIS — I10 Essential (primary) hypertension: Secondary | ICD-10-CM | POA: Diagnosis not present

## 2019-07-06 DIAGNOSIS — E559 Vitamin D deficiency, unspecified: Secondary | ICD-10-CM | POA: Diagnosis not present

## 2019-07-06 DIAGNOSIS — K219 Gastro-esophageal reflux disease without esophagitis: Secondary | ICD-10-CM | POA: Diagnosis not present

## 2019-07-06 DIAGNOSIS — M549 Dorsalgia, unspecified: Secondary | ICD-10-CM

## 2019-07-06 DIAGNOSIS — M65321 Trigger finger, right index finger: Secondary | ICD-10-CM

## 2019-07-06 DIAGNOSIS — M25832 Other specified joint disorders, left wrist: Secondary | ICD-10-CM

## 2019-07-06 DIAGNOSIS — E785 Hyperlipidemia, unspecified: Secondary | ICD-10-CM

## 2019-07-06 DIAGNOSIS — Z780 Asymptomatic menopausal state: Secondary | ICD-10-CM | POA: Diagnosis not present

## 2019-07-06 DIAGNOSIS — B360 Pityriasis versicolor: Secondary | ICD-10-CM | POA: Diagnosis not present

## 2019-07-06 DIAGNOSIS — M546 Pain in thoracic spine: Secondary | ICD-10-CM | POA: Diagnosis not present

## 2019-07-06 NOTE — Progress Notes (Signed)
7/23/202010:33 AM  Paula Meyer 24-Sep-1951, 68 y.o., female [id number]  Chief Complaint  Patient presents with  . Chronic Condition    f/u (gastro)  . Chest Xray    Due to uri previously   . spot on left lung    result f.u   . left mid back pain    over a month off and on     HPI:   Patient is a 68 y.o. female with past medical history significant for HTN, asthma, OA, HLP, GERD, vitamin D deficiency, anxiety who presents today with several concerns  Last OV feb 2020 -  Saw GI - changed to pepcid, trial of zofran, treat constipation Saw [place] - cough and SOB in July, tx with azithromycin covid neg July 2020 Ct abd/pelvis- - calcified granuloma medial LLL, diverticula  Reports cough and SOB are significantly improved Hardly needing albuterol  States that she stopped taking pepcid, has been doing probiotics and avoiding certain foods, doing much better GI wise, no more constipation and nausea only if eats gerd trigger foods  Left wrist volar surface cyst, is not painful Has right hand/ joint, index trigger finger Affects her writing due to pain, interfered with her ability to complete exams Denies any tremors  Having several weeks of left sided mid back pain No trauma APAP and heat helps Takes vitamin D supplement, 2000 Units a day Does not take calcium, does eat diary and dark greens Walks daily Mother has osteoporosis     Depression screen [facility] 2/9 07/06/2019 01/20/2019 11/16/2018  Decreased Interest 0 0 0  Down, Depressed, Hopeless 0 0 0  PHQ - 2 Score 0 0 0  Altered sleeping - - -  Tired, decreased energy - - -  Change in appetite - - -  Feeling bad or failure about yourself  - - -  Trouble concentrating - - -  Moving slowly or fidgety/restless - - -  Suicidal thoughts - - -  PHQ-9 Score - - -  Difficult doing work/chores - - -    Fall Risk  07/06/2019 01/20/2019 11/16/2018 10/29/2018 02/23/2018  Falls in the past year? 0 0 0 0 No  Number falls in past  yr: 0 0 - - -  Injury with Fall? 0 0 - - -  Follow up Falls evaluation completed - - - -     Allergies  Allergen Reactions  . Paxil [Paroxetine Hcl]     Makes her grind her teeth  . Bupropion Hcl Other (See Comments)    palpitations  . Contrast Media [Iodinated Diagnostic Agents] Hives and Itching  . Lisinopril     cough  . Tetracycline Nausea Only    Prior to Admission medications   Medication Sig Start Date End Date Taking? Authorizing Provider  albuterol (PROVENTIL HFA;VENTOLIN HFA) 108 (90 Base) MCG/ACT inhaler Inhale 2 puffs into the lungs every 4 (four) hours as needed for wheezing or shortness of breath (cough, shortness of breath or wheezing.). 01/20/19  Yes Rutherford Guys, MD  aspirin EC 81 MG tablet Take 1 tablet (81 mg total) by mouth daily. 08/24/18  Yes [name], MD  Azelastine HCl 0.15 % SOLN USE 1 SPRAY IN THE NOSE TWICE DAILY 09/28/18  Yes [name], MD  diclofenac sodium (VOLTAREN) 1 % GEL Apply 4 g topically 4 (four) times daily. 02/23/18  Yes Tereasa Coop, PA-C  hydrochlorothiazide (HYDRODIURIL) 25 MG tablet TAKE 1 TABLET BY MOUTH EVERY DAY 05/07/19  Yes  Rutherford Guys, MD  MIMVEY 1-0.5 MG tablet Take 1 tablet by mouth daily. prn 06/17/16  Yes [provider]  telmisartan (MICARDIS) 40 MG tablet TAKE 1 TABLET BY MOUTH EVERY DAY 05/14/19  Yes Rutherford Guys, MD  traMADol (ULTRAM) 50 MG tablet Take 1 tablet (50 mg total) by mouth every 12 (twelve) hours as needed. 01/20/19  Yes Rutherford Guys, MD  Turmeric 500 MG CAPS Take 1 capsule by mouth daily. 08/23/18  Yes [name], MD  ALPRAZolam Duanne Moron) 0.5 MG tablet Take 1 tablet (0.5 mg total) by mouth 2 (two) times daily as needed for anxiety. Patient not taking: Reported on 07/06/2019 08/23/18   [name], MD  amLODipine (NORVASC) 5 MG tablet Take 1 tablet (5 mg total) by mouth daily. Patient not taking: Reported on 07/06/2019 01/20/19   Rutherford Guys, MD  DULoxetine (CYMBALTA) 20 MG  capsule Take 1 capsule (20 mg total) by mouth daily. Patient not taking: Reported on 07/06/2019 11/21/18   Lyndal Pulley, DO  omeprazole (PRILOSEC) 40 MG capsule Take 40 mg by mouth daily.    [provider]  ondansetron (ZOFRAN) 4 MG tablet Take 1 tablet (4 mg total) by mouth every 8 (eight) hours as needed for nausea or vomiting. Patient not taking: Reported on 07/06/2019 01/27/19   Willia Craze, NP  predniSONE (DELTASONE) 50 MG tablet Take 1 tablet 13 hrs then 7 hrs then 1 hr before test Patient not taking: Reported on 07/06/2019 02/15/19   Willia Craze, NP  rosuvastatin (CRESTOR) 10 MG tablet TAKE 1 TABLET BY MOUTH EVERY DAY Patient not taking: Reported on 07/06/2019 01/20/19   Rutherford Guys, MD    Past Medical History:  Diagnosis Date  . Allergy   . Anxiety   . Asthma   . Depression   . Hx of colonic polyps   . Hyperlipidemia   . Hypertension   . Osteoarthritis     Past Surgical History:  Procedure Laterality Date  . TUBAL LIGATION      Social History   Tobacco Use  . Smoking status: Former Research scientist (life sciences)  . Smokeless tobacco: Never Used  Substance Use Topics  . Alcohol use: Yes    Alcohol/week: 3.0 standard drinks    Types: 3 Glasses of wine per week    Comment: rarely    Family History  Problem Relation Age of Onset  . Hyperlipidemia Brother   . Hypertension Brother   . Arthritis Other   . Depression Other   . Colon cancer Neg Hx   . Stomach cancer Neg Hx     Review of Systems  Constitutional: Negative for chills and fever.  Respiratory: Negative for cough, shortness of breath and wheezing.   Cardiovascular: Negative for chest pain, palpitations and leg swelling.  Gastrointestinal: Negative for abdominal pain, nausea and vomiting.   Per hpi  OBJECTIVE:  Today's Vitals   07/06/19 1016 07/06/19 1119  BP: (!) 145/76 114/78  Pulse: 74   Temp: 98.6 F (37 C)   TempSrc: Oral   SpO2: 95%   Weight: 199 lb 3.2 oz (90.4 kg)   Height: 5\' 4"   (1.626 m)    Body mass index is 34.19 kg/m.  Wt Readings from Last 3 Encounters:  07/06/19 199 lb 3.2 oz (90.4 kg)  01/27/19 205 lb (93 kg)  01/20/19 206 lb (93.4 kg)    Physical Exam Vitals signs and nursing note reviewed.  Constitutional:  Appearance: She is well-developed.  HENT:     Head: Normocephalic and atraumatic.     Mouth/Throat:     Pharynx: No oropharyngeal exudate.  Eyes:     General: No scleral icterus.    Conjunctiva/sclera: Conjunctivae normal.     Pupils: Pupils are equal, round, and reactive to light.  Neck:     Musculoskeletal: Neck supple.  Cardiovascular:     Rate and Rhythm: Normal rate and regular rhythm.     Heart sounds: Normal heart sounds. No murmur. No friction rub. No gallop.   Pulmonary:     Effort: Pulmonary effort is normal.     Breath sounds: Normal breath sounds. No wheezing, rhonchi or rales.  Musculoskeletal:     Thoracic back: She exhibits tenderness (right paraspinal) and deformity (kyphosis). She exhibits no bony tenderness and no spasm.  Skin:    General: Skin is warm and dry.  Neurological:     Mental Status: She is alert and oriented to person, place, and time.    Dg Ribs Unilateral W/chest Left  Result Date: 07/06/2019 CLINICAL DATA:  Mid back pain on the left EXAM: LEFT RIBS AND CHEST - 3+ VIEW COMPARISON:  09/02/2016 chest x-ray FINDINGS: No fracture or other bone lesions are seen involving the ribs. There is no evidence of pneumothorax or pleural effusion. Both lungs are clear. Heart size and mediastinal contours are within normal limits. IMPRESSION: Negative. Electronically Signed   By: Monte Fantasia M.D.   On: 07/06/2019 11:36    ASSESSMENT and PLAN  1. Trigger index finger of right hand - Ambulatory referral to Hand Surgery  2. Mid back pain on left side Discussed conservative measures.  - DG Ribs Unilateral W/Chest Left; Future  3. Tinea versicolor Trial of selsun blue  4. Gastroesophageal reflux disease,  esophagitis presence not specified Controlled with LFM  5. Essential hypertension, benign Controlled. Continue current regime.  - CBC  6. Vitamin D deficiency disease Checking labs today, medications will be adjusted as needed.  - Vitamin D, 25-hydroxy  7. Hyperlipidemia with target LDL less than 130 Checking labs today, medications will be adjusted as needed.  - Comprehensive metabolic panel - Lipid panel  8. Postmenopausal estrogen deficiency - DG Bone Density; Future  9. Mass of joint of left wrist Exam suggestive of ganglion cyst, continue surveillance as not causing discomfort - [place] LT UPPER EXTREM LTD SOFT TISSUE NON VASCULAR; Future  Return in about 3 months (around 10/06/2019).    Rutherford Guys, MD Primary Care at [place], [place] [address] Ph.  [phone] Fax [phone]

## 2019-07-06 NOTE — Patient Instructions (Addendum)
Use selsun blue on rash behind ear    If you have lab work done today you will be contacted with your lab results within the next 2 weeks.  If you have not heard from [place] then please contact us. The fastest way to get your results is to register for My Chart.   IF you received an x-ray today, you will receive an invoice from [facility]. Please contact [facility] at [phone] with questions or concerns regarding your invoice.   IF you received labwork today, you will receive an invoice from [place]. Please contact [facility] at [phone] with questions or concerns regarding your invoice.   Our billing staff will not be able to assist you with questions regarding bills from these companies.  You will be contacted with the lab results as soon as they are available. The fastest way to get your results is to activate your My Chart account. Instructions are located on the last page of this paperwork. If you have not heard from [place] regarding the results in 2 weeks, please contact this office.

## 2019-07-07 ENCOUNTER — Ambulatory Visit
Admission: RE | Admit: 2019-07-07 | Discharge: 2019-07-07 | Disposition: A | Discharge status: Home / Self Care | Payer: BC Managed Care – PPO | Source: Ambulatory Visit | Attending: Family Medicine | Admitting: Family Medicine

## 2019-07-07 DIAGNOSIS — Z13820 Encounter for screening for osteoporosis: Secondary | ICD-10-CM | POA: Diagnosis not present

## 2019-07-07 DIAGNOSIS — Z780 Asymptomatic menopausal state: Secondary | ICD-10-CM | POA: Diagnosis not present

## 2019-07-07 LAB — COMPREHENSIVE METABOLIC PANEL
ALT: 12 IU/L (ref 0–32)
AST: 17 IU/L (ref 0–40)
Albumin/Globulin Ratio: 1.7 (ref 1.2–2.2)
Albumin: 4.3 g/dL (ref 3.8–4.8)
Alkaline Phosphatase: 62 IU/L (ref 39–117)
BUN/Creatinine Ratio: 10 — ABNORMAL LOW (ref 12–28)
BUN: 10 mg/dL (ref 8–27)
Bilirubin Total: 0.3 mg/dL (ref 0.0–1.2)
CO2: 26 mmol/L (ref 20–29)
Calcium: 9.8 mg/dL (ref 8.7–10.3)
Chloride: 95 mmol/L — ABNORMAL LOW (ref 96–106)
Creatinine, Ser: 0.97 mg/dL (ref 0.57–1.00)
GFR calc Af Amer: 71 mL/min/{1.73_m2} (ref >59)
GFR calc non Af Amer: 61 mL/min/{1.73_m2} (ref >59)
Globulin, Total: 2.6 g/dL (ref 1.5–4.5)
Glucose: 86 mg/dL (ref 65–99)
Potassium: 3.4 mmol/L — ABNORMAL LOW (ref 3.5–5.2)
Sodium: 134 mmol/L (ref 134–144)
Total Protein: 6.9 g/dL (ref 6.0–8.5)

## 2019-07-07 LAB — CBC
Hematocrit: 34.3 % (ref 34.0–46.6)
Hemoglobin: 11.6 g/dL (ref 11.1–15.9)
MCH: 26.3 pg — ABNORMAL LOW (ref 26.6–33.0)
MCHC: 33.8 g/dL (ref 31.5–35.7)
MCV: 78 fL — ABNORMAL LOW (ref 79–97)
Platelets: 234 10*3/uL (ref 150–450)
RBC: 4.41 x10E6/uL (ref 3.77–5.28)
RDW: 13.1 % (ref 11.7–15.4)
WBC: 3.1 10*3/uL — ABNORMAL LOW (ref 3.4–10.8)

## 2019-07-07 LAB — LIPID PANEL
Chol/HDL Ratio: 6.6 ratio — ABNORMAL HIGH (ref 0.0–4.4)
Cholesterol, Total: 298 mg/dL — ABNORMAL HIGH (ref 100–199)
HDL: 45 mg/dL (ref >39)
LDL Calculated: 234 mg/dL — ABNORMAL HIGH (ref 0–99)
Triglycerides: 94 mg/dL (ref 0–149)
VLDL Cholesterol Cal: 19 mg/dL (ref 5–40)

## 2019-07-07 LAB — VITAMIN D 25 HYDROXY (VIT D DEFICIENCY, FRACTURES): Vit D, 25-Hydroxy: 23 ng/mL — ABNORMAL LOW (ref 30.0–100.0)

## 2019-07-10 ENCOUNTER — Ambulatory Visit (INDEPENDENT_AMBULATORY_CARE_PROVIDER_SITE_OTHER): Payer: Medicare Other

## 2019-07-10 ENCOUNTER — Ambulatory Visit (INDEPENDENT_AMBULATORY_CARE_PROVIDER_SITE_OTHER): Payer: Medicare Other | Admitting: Orthopaedic Surgery

## 2019-07-10 ENCOUNTER — Encounter: Payer: Self-pay | Admitting: Physician Assistant

## 2019-07-10 DIAGNOSIS — M79641 Pain in right hand: Secondary | ICD-10-CM | POA: Diagnosis not present

## 2019-07-10 MED ORDER — BUPIVACAINE HCL 0.25 % IJ SOLN
0.3300 mL | INTRAMUSCULAR | Status: AC | PRN
  Administered 2019-07-10: .33 mL

## 2019-07-10 MED ORDER — LIDOCAINE HCL 1 % IJ SOLN
3.0000 mL | INTRAMUSCULAR | Status: AC | PRN
  Administered 2019-07-10: 3 mL

## 2019-07-10 MED ORDER — METHYLPREDNISOLONE ACETATE 40 MG/ML IJ SUSP
13.3300 mg | INTRAMUSCULAR | Status: AC | PRN
  Administered 2019-07-10: 13.33 mg

## 2019-07-10 NOTE — Progress Notes (Signed)
Office Visit Note   Patient: Paula Meyer           Date of Birth: March 27, 1951           MRN: [id number] Visit Date: 07/10/2019              Requested by: Rutherford Guys, MD [address] [place],  [place] [address] PCP: Rutherford Guys, MD   Assessment & Plan: Visit Diagnoses:  1. Pain in right hand     Plan: Impression is right thumb CMC joint arthritis.  We injected this with cortisone today.  I provided the patient with a removable thumb spica splint to wear to help settle this down.  She will follow-up with [place] as needed.  Call with concerns or questions in the meantime.  Follow-Up Instructions: Return if symptoms worsen or fail to improve.   Orders:  Orders Placed This Encounter  Procedures  . Hand/UE Inj: R thumb CMC  . XR Hand Complete Right   No orders of the defined types were placed in this encounter.     Procedures: Hand/UE Inj: R thumb CMC for osteoarthritis on 07/10/2019 10:37 AM Medications: 0.33 mL bupivacaine 0.25 %; 3 mL lidocaine 1 %; 13.33 mg methylPREDNISolone acetate 40 MG/ML      Clinical Data: No additional findings.   Subjective: Chief Complaint  Patient presents with  . Right Hand - Pain    HPI patient is a pleasant 68 year old right-hand-dominant female who presents our clinic today with right hand thumb pain.  This has been ongoing for the past several years.  No new injury.  She does note that she has been taking classes and her thumb pain has become more bothersome as she has been taking a fair amount of notes.  This seems to be what worsens her pain.  She has gotten to the point where it is difficult for her to hold her pen.   She has been taking over-the-counter anti-inflammatories without relief of symptoms.  She denies any numbness, tingling or burning.  Review of Systems as detailed in HPI.  All others reviewed and are negative.   Objective: Vital Signs: LMP 04/02/2011   Physical Exam well-developed and well-nourished female  in no acute distress.  Alert and oriented x3.  Ortho Exam examination of her right thumb reveals mild swelling.  Moderate tenderness along the first [facility] joint.  Minimally positive grind test.  Full range of motion.  No pain over the first dorsal compartment.  She is neurovascular intact distally.  Specialty Comments:  No specialty comments available.  Imaging: Xr Hand Complete Right  Result Date: 07/10/2019 X-rays demonstrate moderate degenerative changes to the first [facility] joint    PMFS History: Patient Active Problem List   Diagnosis Date Noted  . Frozen shoulder 11/02/2018  . Degenerative disc disease, cervical 11/02/2018  . Nausea in adult 10/24/2018  . Chronic right shoulder pain 10/24/2018  . Constipation 08/24/2018  . Vitamin D deficiency disease 08/23/2018  . Morbid obesity ([place]) 03/17/2018  . Seasonal affective disorder ([place]) 11/03/2017  . GAD (generalized anxiety disorder) 11/03/2017  . Other screening mammogram 07/10/2013  . B12 deficiency anemia 08/24/2012  . GERD (gastroesophageal reflux disease) 06/30/2012  . Eczema 05/04/2012  . DDD (degenerative disc disease), lumbar 04/02/2011  . Hyperlipidemia with target LDL less than 130 10/20/2010  . Depression with anxiety 10/20/2010  . Essential hypertension, benign 10/20/2010  . ALLERGIC RHINITIS 10/20/2010  . Mild persistent asthma 10/20/2010  . Osteoarthritis 10/20/2010  Past Medical History:  Diagnosis Date  . Allergy   . Anxiety   . Asthma   . Depression   . Hx of colonic polyps   . Hyperlipidemia   . Hypertension   . Osteoarthritis     Family History  Problem Relation Age of Onset  . Hyperlipidemia Brother   . Hypertension Brother   . Arthritis Other   . Depression Other   . Colon cancer Neg Hx   . Stomach cancer Neg Hx     Past Surgical History:  Procedure Laterality Date  . TUBAL LIGATION     Social History   Occupational History  . Not on file  Tobacco Use  . Smoking status: Former  Research scientist (life sciences)  . Smokeless tobacco: Never Used  Substance and Sexual Activity  . Alcohol use: Yes    Alcohol/week: 3.0 standard drinks    Types: 3 Glasses of wine per week    Comment: rarely  . Drug use: No  . Sexual activity: Not Currently

## 2019-07-12 ENCOUNTER — Other Ambulatory Visit: Payer: Self-pay | Admitting: Family Medicine

## 2019-07-12 NOTE — Telephone Encounter (Signed)
Requested Prescriptions  Pending Prescriptions Disp Refills  . albuterol (VENTOLIN HFA) 108 (90 Base) MCG/ACT inhaler [Pharmacy Med Name: ALBUTEROL HFA (PROVENTIL) INH] 6.7 g 1    Sig: INHALE 2 PUFFS INTO THE LUNGS EVERY 4 HOURS AS NEEDED FOR COUGH, WHEEZING OR SHORTNESS OF BREATH     Pulmonology:  Beta Agonists Failed - 07/12/2019  1:29 AM      Failed - One inhaler should last at least one month. If the patient is requesting refills earlier, contact the patient to check for uncontrolled symptoms.      Passed - Valid encounter within last 12 months    Recent Outpatient Visits          6 days ago Trigger index finger of right hand   [facility], Lilia Argue, MD   5 months ago Essential hypertension, benign   [facility], Lilia Argue, MD   7 months ago Essential hypertension, benign   [facility], Lilia Argue, MD   8 months ago Chronic neck pain   [facility], [place], DO   8 months ago Essential hypertension, benign   [facility], Lilia Argue, MD      Future Appointments            In 2 months Rutherford Guys, MD Primary [facility] at [place], [facility]

## 2019-07-15 ENCOUNTER — Other Ambulatory Visit: Payer: Self-pay | Admitting: Internal Medicine

## 2019-07-15 DIAGNOSIS — E559 Vitamin D deficiency, unspecified: Secondary | ICD-10-CM

## 2019-07-17 ENCOUNTER — Other Ambulatory Visit: Payer: Self-pay | Admitting: Family Medicine

## 2019-07-17 DIAGNOSIS — E559 Vitamin D deficiency, unspecified: Secondary | ICD-10-CM

## 2019-07-17 DIAGNOSIS — E785 Hyperlipidemia, unspecified: Secondary | ICD-10-CM

## 2019-07-17 MED ORDER — ROSUVASTATIN CALCIUM 10 MG PO TABS: 10.0000 mg | ORAL_TABLET | Freq: Every day | ORAL | 3 refills | Status: DC

## 2019-07-18 ENCOUNTER — Ambulatory Visit
Admission: RE | Admit: 2019-07-18 | Discharge: 2019-07-18 | Disposition: A | Discharge status: Home / Self Care | Payer: Medicare Other | Source: Ambulatory Visit | Attending: Family Medicine | Admitting: Family Medicine

## 2019-07-18 ENCOUNTER — Other Ambulatory Visit: Payer: Self-pay

## 2019-07-18 ENCOUNTER — Encounter: Payer: Self-pay | Admitting: Family Medicine

## 2019-07-18 DIAGNOSIS — M25832 Other specified joint disorders, left wrist: Secondary | ICD-10-CM | POA: Diagnosis not present

## 2019-07-26 DIAGNOSIS — M62830 Muscle spasm of back: Secondary | ICD-10-CM | POA: Diagnosis not present

## 2019-07-26 DIAGNOSIS — M546 Pain in thoracic spine: Secondary | ICD-10-CM | POA: Diagnosis not present

## 2019-08-03 DIAGNOSIS — M546 Pain in thoracic spine: Secondary | ICD-10-CM | POA: Diagnosis not present

## 2019-08-03 NOTE — Addendum Note (Signed)
Addended by: Rutherford Guys on: 08/03/2019 07:28 PM   Modules accepted: Orders

## 2019-08-14 DIAGNOSIS — M546 Pain in thoracic spine: Secondary | ICD-10-CM | POA: Diagnosis not present

## 2019-08-22 ENCOUNTER — Ambulatory Visit (INDEPENDENT_AMBULATORY_CARE_PROVIDER_SITE_OTHER): Payer: Medicare Other | Admitting: Orthopaedic Surgery

## 2019-08-22 ENCOUNTER — Encounter: Payer: Self-pay | Admitting: Orthopaedic Surgery

## 2019-08-22 ENCOUNTER — Other Ambulatory Visit: Payer: Self-pay

## 2019-08-22 DIAGNOSIS — M1811 Unilateral primary osteoarthritis of first carpometacarpal joint, right hand: Secondary | ICD-10-CM | POA: Diagnosis not present

## 2019-08-22 MED ORDER — DICLOFENAC SODIUM 1 % TD GEL: 2.0000 g | Freq: Four times a day (QID) | TRANSDERMAL | 1 refills | Status: DC

## 2019-08-22 NOTE — Progress Notes (Signed)
Office Visit Note   Patient: Paula Meyer           Date of Birth: [birth year]           MRN: [id number] Visit Date: 08/22/2019              Requested by: Rutherford Guys, MD [address] [place],  [place] [address] PCP: Rutherford Guys, MD   Assessment & Plan: Visit Diagnoses:  1. Primary osteoarthritis of first carpometacarpal joint of right hand     Plan: Impression is right first [place] joint arthritis.  We will start the patient on Voltaren gel and have her wear her thumb spica splint while in class and with activity.  She is not any better in the next 4 to 6 weeks she will follow-up where we may try another cortisone injection.  Call with concerns or questions in the meantime.  Follow-Up Instructions: Return if symptoms worsen or fail to improve.   Orders:  No orders of the defined types were placed in this encounter.  No orders of the defined types were placed in this encounter.     Procedures: No procedures performed   Clinical Data: No additional findings.   Subjective: Chief Complaint  Patient presents with  . Right Hand - Pain    HPI patient is a pleasant right-hand-dominant 68 year old female who presents our clinic today with recurrent right hand pain.  She was seen in our office towards the end of July of this year where her first [facility] joint was injected with cortisone.  She noticed significant relief of symptoms, but only lasted for approximately 3 weeks.  Her pain has returned at this point.  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  She does have a thumb spica splint for which she only wears at night.  Review of Systems as detailed in HPI.  All others reviewed and are negative.   Objective: Vital Signs: LMP 04/02/2011   Physical Exam well-developed and well-nourished female no acute distress.  Alert and oriented x3.  Ortho Exam examination of her right hand reveals positive grind test.  Increased pain over the  first [facility] joint.  Full range of motion of the thumb.  No tenderness over the A1 pulley.  No locking.  No tenderness over the first dorsal compartment.  She is neurovascular intact distally.  Specialty Comments:  No specialty comments available.  Imaging: No new imaging   PMFS History: Patient Active Problem List   Diagnosis Date Noted  . Frozen shoulder 11/02/2018  . Degenerative disc disease, cervical 11/02/2018  . Nausea in adult 10/24/2018  . Chronic right shoulder pain 10/24/2018  . Constipation 08/24/2018  . Vitamin D deficiency disease 08/23/2018  . Morbid obesity ([place]) 03/17/2018  . Seasonal affective disorder ([place]) 11/03/2017  . GAD (generalized anxiety disorder) 11/03/2017  . Other screening mammogram 07/10/2013  . B12 deficiency anemia 08/24/2012  . GERD (gastroesophageal reflux disease) 06/30/2012  . Eczema 05/04/2012  . DDD (degenerative disc disease), lumbar 04/02/2011  . Hyperlipidemia with target LDL less than 130 10/20/2010  . Depression with anxiety 10/20/2010  . Essential hypertension, benign 10/20/2010  . ALLERGIC RHINITIS 10/20/2010  . Mild persistent asthma 10/20/2010  . Osteoarthritis 10/20/2010   Past Medical History:  Diagnosis Date  . Allergy   . Anxiety   . Asthma   . Depression   . Hx of colonic polyps   . Hyperlipidemia   .  Hypertension   . Osteoarthritis     Family History  Problem Relation Age of Onset  . Hyperlipidemia Brother   . Hypertension Brother   . Arthritis Other   . Depression Other   . Colon cancer Neg Hx   . Stomach cancer Neg Hx     Past Surgical History:  Procedure Laterality Date  . TUBAL LIGATION     Social History   Occupational History  . Not on file  Tobacco Use  . Smoking status: Former Research scientist (life sciences)  . Smokeless tobacco: Never Used  Substance and Sexual Activity  . Alcohol use: Yes    Alcohol/week: 3.0 standard drinks    Types: 3 Glasses of wine per week    Comment: rarely  . Drug use: No  . Sexual  activity: Not Currently

## 2019-09-01 DIAGNOSIS — Z124 Encounter for screening for malignant neoplasm of cervix: Secondary | ICD-10-CM | POA: Diagnosis not present

## 2019-09-21 DIAGNOSIS — Z1231 Encounter for screening mammogram for malignant neoplasm of breast: Secondary | ICD-10-CM | POA: Diagnosis not present

## 2019-10-05 ENCOUNTER — Ambulatory Visit: Payer: BC Managed Care – PPO | Admitting: Family Medicine

## 2019-10-10 ENCOUNTER — Other Ambulatory Visit: Payer: Self-pay

## 2019-10-10 ENCOUNTER — Encounter: Payer: Self-pay | Admitting: Family Medicine

## 2019-10-10 ENCOUNTER — Ambulatory Visit (INDEPENDENT_AMBULATORY_CARE_PROVIDER_SITE_OTHER): Payer: Medicare Other | Admitting: Family Medicine

## 2019-10-10 VITALS — BP 118/80 | HR 70 | Temp 98.6°F | Ht 64.0 in | Wt 196.0 lb

## 2019-10-10 DIAGNOSIS — I10 Essential (primary) hypertension: Secondary | ICD-10-CM | POA: Diagnosis not present

## 2019-10-10 DIAGNOSIS — E785 Hyperlipidemia, unspecified: Secondary | ICD-10-CM | POA: Diagnosis not present

## 2019-10-10 DIAGNOSIS — E559 Vitamin D deficiency, unspecified: Secondary | ICD-10-CM | POA: Diagnosis not present

## 2019-10-10 DIAGNOSIS — E876 Hypokalemia: Secondary | ICD-10-CM

## 2019-10-10 MED ORDER — DICLOFENAC SODIUM 1 % TD GEL: 2.0000 g | Freq: Four times a day (QID) | TRANSDERMAL | 1 refills | Status: DC

## 2019-10-10 MED ORDER — ATORVASTATIN CALCIUM 20 MG PO TABS: 20.0000 mg | ORAL_TABLET | Freq: Every day | ORAL | 3 refills | Status: DC

## 2019-10-10 NOTE — Progress Notes (Signed)
10/27/20204:15 PM  Paula Meyer 1951/09/21, 68 y.o., female [id number]  Chief Complaint  Patient presents with  . Follow-up    htn, Not taking the crestor due to the side effects. Uploading report for mammo to mychart.     HPI:   Patient is a 68 y.o. female with past medical history significant for HTN, asthma, OA, HLP, GERD, vitamin D deficiency, anxiety  who presents today for routine followup  Last OV July 2020 Mild low K and D3 LDL 234 - asked to restart crestor She feels that messes with her memory She has increased her daily OTC D3  To 5000 units a day She would like to stop HCTZ as in the past she has had increase crt and low K She has been taking OTC K supplement  Lab Results  Component Value Date   CREATININE 0.97 07/06/2019   BUN 10 07/06/2019   NA 134 07/06/2019   K 3.4 (L) 07/06/2019   CL 95 (L) 07/06/2019   CO2 26 07/06/2019   Lab Results  Component Value Date   CHOL 298 (H) 07/06/2019   HDL 45 07/06/2019   LDLCALC 234 (H) 07/06/2019   LDLDIRECT 188.6 07/10/2013   TRIG 94 07/06/2019   CHOLHDL 6.6 (H) 07/06/2019    Depression screen PHQ 2/9 10/10/2019 07/06/2019 01/20/2019  Decreased Interest 0 0 0  Down, Depressed, Hopeless 0 0 0  PHQ - 2 Score 0 0 0  Altered sleeping - - -  Tired, decreased energy - - -  Change in appetite - - -  Feeling bad or failure about yourself  - - -  Trouble concentrating - - -  Moving slowly or fidgety/restless - - -  Suicidal thoughts - - -  PHQ-9 Score - - -  Difficult doing work/chores - - -    Fall Risk  10/10/2019 07/06/2019 01/20/2019 11/16/2018 10/29/2018  Falls in the past year? 0 0 0 0 0  Number falls in past yr: 0 0 0 - -  Injury with Fall? 0 0 0 - -  Follow up - Falls evaluation completed - - -     Allergies  Allergen Reactions  . Paxil [Paroxetine Hcl]     Makes her grind her teeth  . Bupropion Hcl Other (See Comments)    palpitations  . Contrast Media [Iodinated Diagnostic Agents] Hives and  Itching  . Lisinopril     cough  . Tetracycline Nausea Only    Prior to Admission medications   Medication Sig Start Date End Date Taking? Authorizing Provider  albuterol (VENTOLIN HFA) 108 (90 Base) MCG/ACT inhaler INHALE 2 PUFFS INTO THE LUNGS EVERY 4 HOURS AS NEEDED FOR COUGH, WHEEZING OR SHORTNESS OF BREATH 07/12/19   Rutherford Guys, MD  ALPRAZolam Duanne Moron) 0.5 MG tablet Take 1 tablet (0.5 mg total) by mouth 2 (two) times daily as needed for anxiety. Patient not taking: Reported on 07/06/2019 08/23/18   [name], MD  amLODipine (NORVASC) 5 MG tablet Take 1 tablet (5 mg total) by mouth daily. Patient not taking: Reported on 07/06/2019 01/20/19   Rutherford Guys, MD  aspirin EC 81 MG tablet Take 1 tablet (81 mg total) by mouth daily. 08/24/18   [name], MD  Azelastine HCl 0.15 % SOLN USE 1 SPRAY IN THE NOSE TWICE DAILY 09/28/18   [name], MD  diclofenac sodium (VOLTAREN) 1 % GEL Apply 2 g topically 4 (four) times daily. 08/22/19   Dwana Melena  L, PA-C  hydrochlorothiazide (HYDRODIURIL) 25 MG tablet TAKE 1 TABLET BY MOUTH EVERY DAY 05/07/19   Rutherford Guys, MD  MIMVEY 1-0.5 MG tablet Take 1 tablet by mouth daily. prn 06/17/16   [provider]  rosuvastatin (CRESTOR) 10 MG tablet Take 1 tablet (10 mg total) by mouth daily. Patient not taking: Reported on 10/10/2019 07/17/19   Rutherford Guys, MD  telmisartan (MICARDIS) 40 MG tablet TAKE 1 TABLET BY MOUTH EVERY DAY 05/14/19   Rutherford Guys, MD  Turmeric 500 MG CAPS Take 1 capsule by mouth daily. 08/23/18   [name], MD    Past Medical History:  Diagnosis Date  . Allergy   . Anxiety   . Asthma   . Depression   . Hx of colonic polyps   . Hyperlipidemia   . Hypertension   . Osteoarthritis     Past Surgical History:  Procedure Laterality Date  . TUBAL LIGATION      Social History   Tobacco Use  . Smoking status: Former Research scientist (life sciences)  . Smokeless tobacco: Never Used  Substance Use Topics  . Alcohol  use: Yes    Alcohol/week: 3.0 standard drinks    Types: 3 Glasses of wine per week    Comment: rarely    Family History  Problem Relation Age of Onset  . Hyperlipidemia Brother   . Hypertension Brother   . Arthritis Other   . Depression Other   . Colon cancer Neg Hx   . Stomach cancer Neg Hx     Review of Systems  Constitutional: Negative for chills and fever.  Respiratory: Negative for cough and shortness of breath.   Cardiovascular: Positive for palpitations (rare extra beat wo assoc sx). Negative for chest pain and leg swelling.  Gastrointestinal: Negative for abdominal pain, nausea and vomiting.     OBJECTIVE:  Today's Vitals   10/10/19 1608  BP: 118/80  Pulse: 70  Temp: 98.6 F (37 C)  SpO2: 96%  Weight: 196 lb (88.9 kg)  Height: 5\' 4"  (1.626 m)   Body mass index is 33.64 kg/m.  BP Readings from Last 3 Encounters:  10/10/19 118/80  07/06/19 114/78  01/27/19 126/72    Physical Exam Vitals signs and nursing note reviewed.  Constitutional:      Appearance: She is well-developed.  HENT:     Head: Normocephalic and atraumatic.     Mouth/Throat:     Pharynx: No oropharyngeal exudate.  Eyes:     General: No scleral icterus.    Conjunctiva/sclera: Conjunctivae normal.     Pupils: Pupils are equal, round, and reactive to light.  Neck:     Musculoskeletal: Neck supple.  Cardiovascular:     Rate and Rhythm: Normal rate and regular rhythm.     Heart sounds: Normal heart sounds. No murmur. No friction rub. No gallop.   Pulmonary:     Effort: Pulmonary effort is normal.     Breath sounds: Normal breath sounds. No wheezing or rales.  Skin:    General: Skin is warm and dry.  Neurological:     Mental Status: She is alert and oriented to person, place, and time.     No results found for this or any previous visit (from the past 24 hour(s)).  No results found.   ASSESSMENT and PLAN  1. Essential hypertension, benign Controlled. Continue current  regime.   2. Hyperlipidemia with target LDL less than 130 Did not tolerate crestor, trial of atorvastatin, consider cards  lipid clinic  3. Hypokalemia Checking labs today, medications will be adjusted as needed.  - Basic Metabolic Panel  4. Vitamin D deficiency Checking labs today, medications will be adjusted as needed.  - Vitamin D, 25-hydroxy  Other orders - atorvastatin (LIPITOR) 20 MG tablet; Take 1 tablet (20 mg total) by mouth daily. - diclofenac sodium (VOLTAREN) 1 % GEL; Apply 2 g topically 4 (four) times daily.  Return in about 3 months (around 01/10/2020).    Rutherford Guys, MD Primary Care at [facility] [place], [place] [address] Ph.  [phone] Fax [phone]

## 2019-10-11 ENCOUNTER — Encounter: Payer: Self-pay | Admitting: Family Medicine

## 2019-10-11 LAB — BASIC METABOLIC PANEL
BUN/Creatinine Ratio: 16 (ref 12–28)
BUN: 14 mg/dL (ref 8–27)
CO2: 27 mmol/L (ref 20–29)
Calcium: 9.4 mg/dL (ref 8.7–10.3)
Chloride: 103 mmol/L (ref 96–106)
Creatinine, Ser: 0.9 mg/dL (ref 0.57–1.00)
GFR calc Af Amer: 77 mL/min/{1.73_m2} (ref >59)
GFR calc non Af Amer: 67 mL/min/{1.73_m2} (ref >59)
Glucose: 88 mg/dL (ref 65–99)
Potassium: 3.7 mmol/L (ref 3.5–5.2)
Sodium: 143 mmol/L (ref 134–144)

## 2019-10-11 LAB — VITAMIN D 25 HYDROXY (VIT D DEFICIENCY, FRACTURES): Vit D, 25-Hydroxy: 40.1 ng/mL (ref 30.0–100.0)

## 2019-10-29 ENCOUNTER — Encounter: Payer: Self-pay | Admitting: Family Medicine

## 2019-11-03 ENCOUNTER — Other Ambulatory Visit: Payer: Self-pay | Admitting: Family Medicine

## 2019-11-24 IMAGING — DX LEFT RIBS AND CHEST - 3+ VIEW
4 series · 4 of 4 positions shown · non-contrast
Comparison: 09/02/2016 chest x-ray

CLINICAL DATA: Mid back pain on the left

EXAM:
LEFT RIBS AND CHEST - 3+ VIEW

[chest pa]
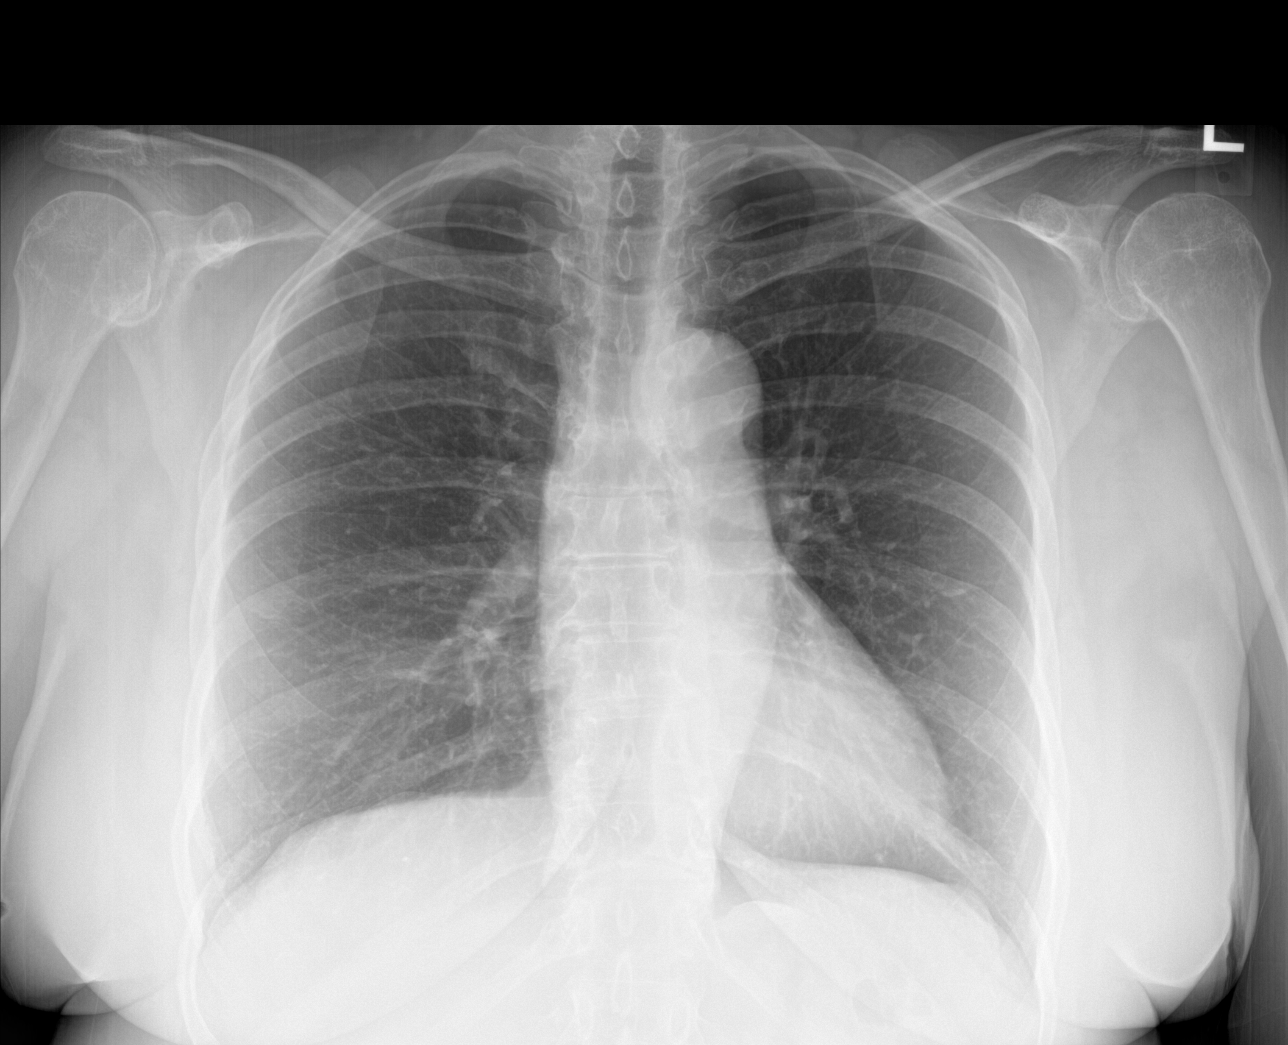

[rib obl (1 of 2)]
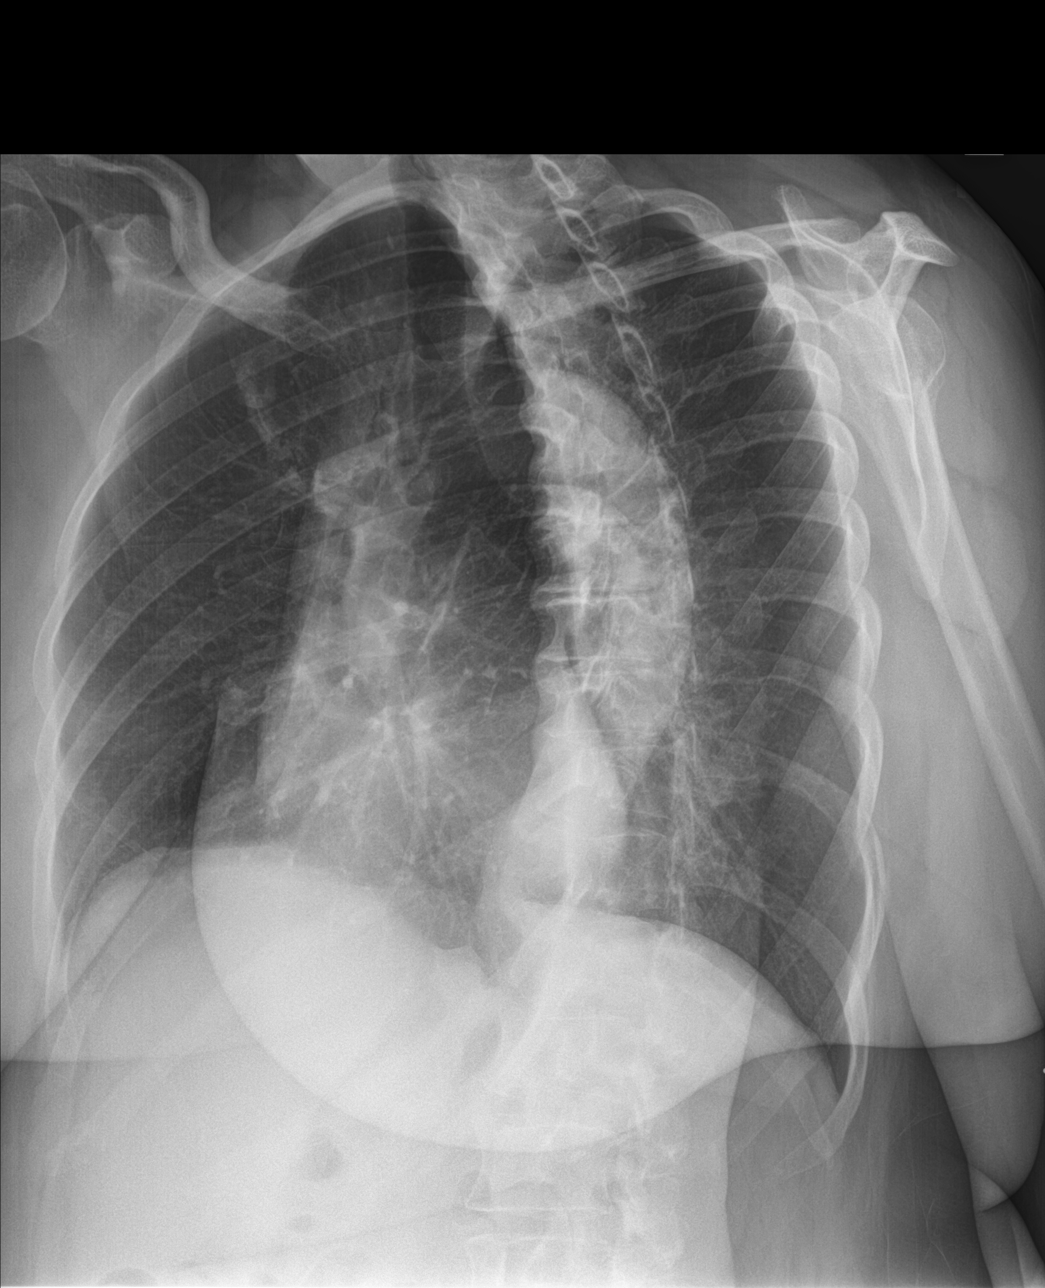

[rib obl (2 of 2)]
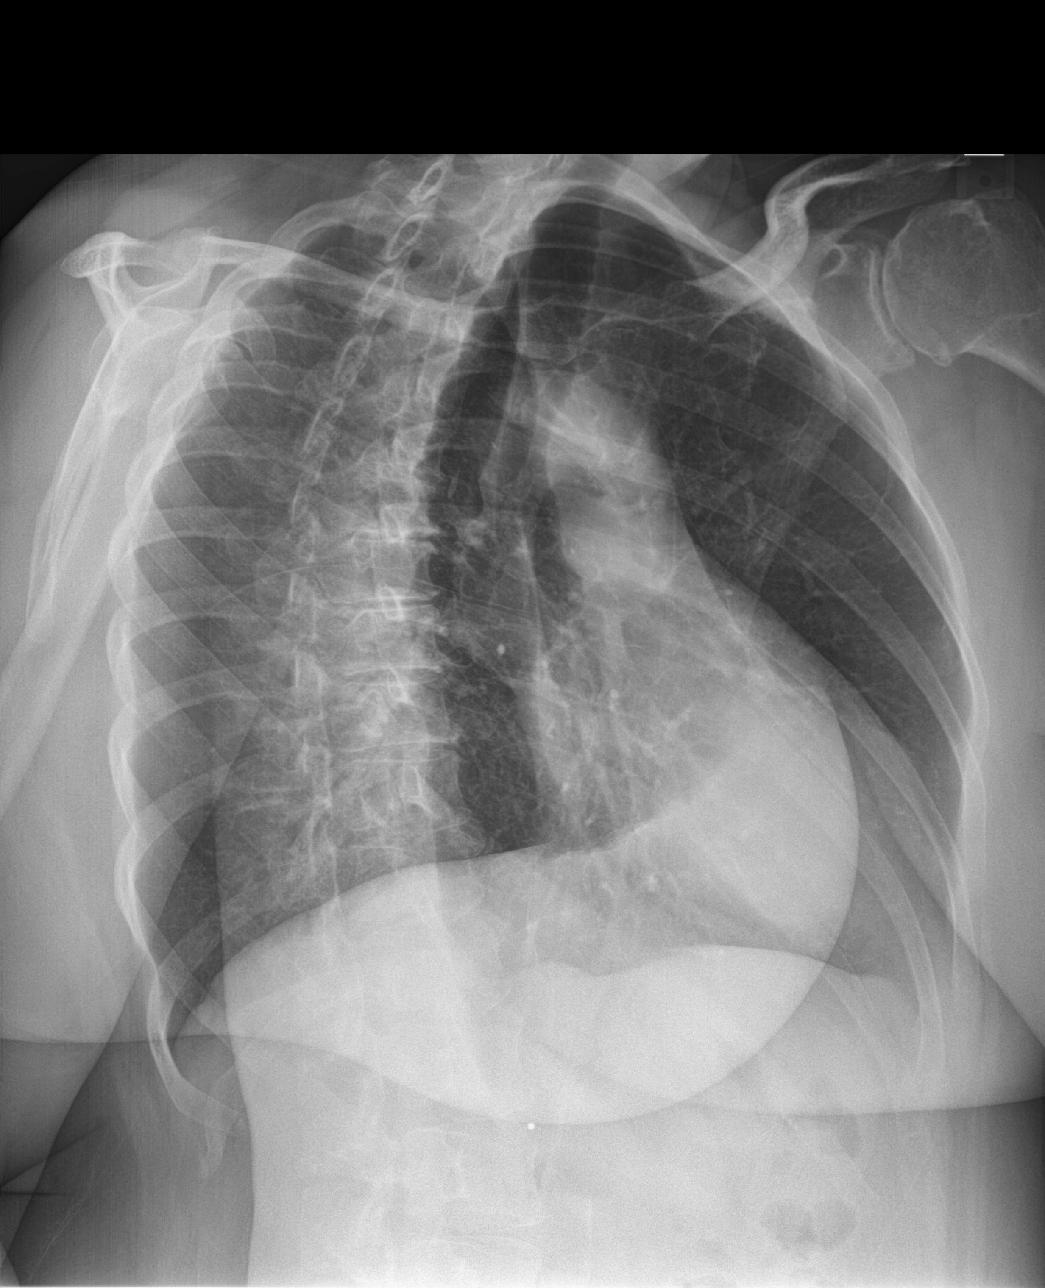

[chest ap]
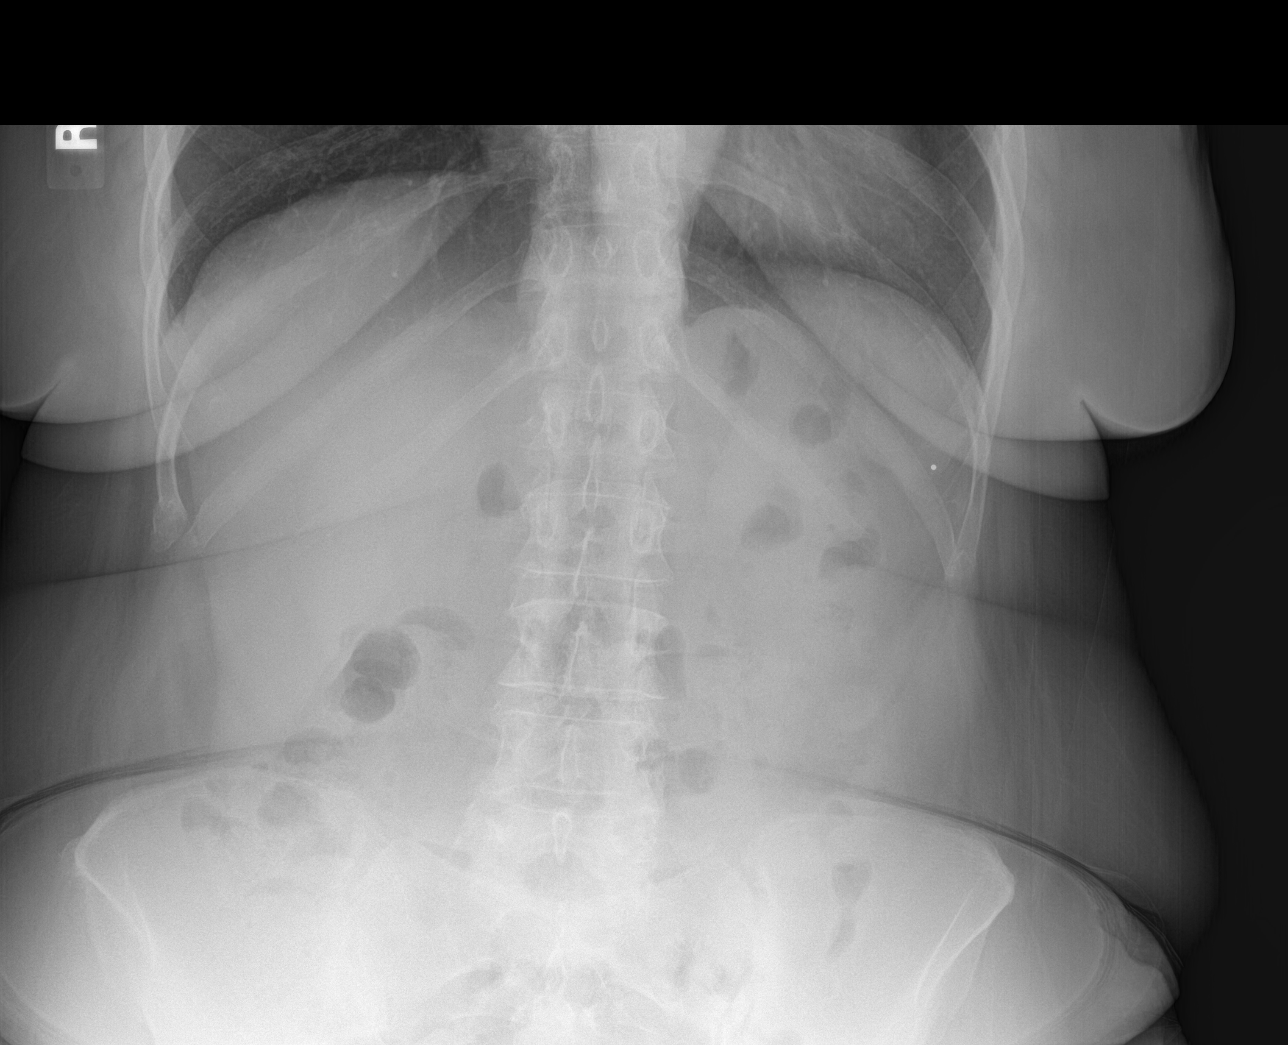

[4 of 4 positions shown; findings below may reference images not displayed]

FINDINGS: No fracture or other bone lesions are seen involving the ribs. There
is no evidence of pneumothorax or pleural effusion. Both lungs are
clear. Heart size and mediastinal contours are within normal limits.
IMPRESSION: Negative.

## 2019-11-27 ENCOUNTER — Other Ambulatory Visit: Payer: Self-pay | Admitting: Family Medicine

## 2019-11-27 DIAGNOSIS — M25552 Pain in left hip: Secondary | ICD-10-CM

## 2019-11-27 DIAGNOSIS — M25562 Pain in left knee: Secondary | ICD-10-CM

## 2019-11-27 NOTE — Telephone Encounter (Signed)
Requested medication (s) are due for refill today: yes  Requested medication (s) are on the active medication list: No  Last refill:  not on medication list D/C 2.2.2020  Future visit scheduled:yes  Notes to clinic: Not delegated not on current medication list.    Requested Prescriptions  Pending Prescriptions Disp Refills   traMADol (ULTRAM) 50 MG tablet [Pharmacy Med Name: TRAMADOL HCL 50 MG TABLET] 14 tablet     Sig: Take 1 tablet (50 mg total) by mouth every 12 (twelve) hours as needed.      Not Delegated - Analgesics:  Opioid Agonists Failed - 11/27/2019  4:57 PM      Failed - This refill cannot be delegated      Failed - Urine Drug Screen completed in last 360 days.      Passed - Valid encounter within last 6 months    Recent Outpatient Visits           1 month ago Essential hypertension, benign   [facility], Lilia Argue, MD   4 months ago Trigger index finger of right hand   [facility], Lilia Argue, MD   5 months ago Shortness of breath   [facility] [place], NP   10 months ago Essential hypertension, benign   [facility], Lilia Argue, MD   1 year ago Essential hypertension, benign   [facility], Lilia Argue, MD       Future Appointments             In 1 month Rutherford Guys, MD Primary [facility] at [place], [facility]

## 2019-11-28 ENCOUNTER — Other Ambulatory Visit: Payer: Self-pay | Admitting: Family Medicine

## 2019-12-11 ENCOUNTER — Ambulatory Visit (INDEPENDENT_AMBULATORY_CARE_PROVIDER_SITE_OTHER): Payer: Medicare Other | Admitting: Family Medicine

## 2019-12-11 ENCOUNTER — Other Ambulatory Visit: Payer: Self-pay

## 2019-12-11 ENCOUNTER — Encounter: Payer: Self-pay | Admitting: Family Medicine

## 2019-12-11 DIAGNOSIS — R300 Dysuria: Secondary | ICD-10-CM | POA: Diagnosis not present

## 2019-12-11 LAB — POCT URINALYSIS DIP (MANUAL ENTRY)
Bilirubin, UA: NEGATIVE
Blood, UA: NEGATIVE
Glucose, UA: NEGATIVE mg/dL
Ketones, POC UA: NEGATIVE mg/dL
Nitrite, UA: NEGATIVE
Protein Ur, POC: NEGATIVE mg/dL
Spec Grav, UA: 1.025 (ref 1.010–1.025)
Urobilinogen, UA: 0.2 E.U./dL
pH, UA: 5.5 (ref 5.0–8.0)

## 2019-12-11 LAB — POC MICROSCOPIC URINALYSIS (UMFC): Mucus: ABSENT

## 2019-12-11 MED ORDER — NITROFURANTOIN MONOHYD MACRO 100 MG PO CAPS: 100.0000 mg | ORAL_CAPSULE | Freq: Two times a day (BID) | ORAL | 0 refills | Status: DC

## 2019-12-11 NOTE — Patient Instructions (Addendum)
Continue what you have been doing with drinking plenty of fluids  If you get abruptly worse at any time with fever and chills get back to [place] or go to an ER or urgent care if needed.  We will try and let you know the results of the culture a few days  Take the nitrofurantoin (Macrobid) 1 twice daily for 3 days   Urinary Tract Infection, Adult A urinary tract infection (UTI) is an infection of any part of the urinary tract. The urinary tract includes:  The kidneys.  The ureters.  The bladder.  The urethra. These organs make, store, and get rid of pee (urine) in the body. What are the causes? This is caused by germs (bacteria) in your genital area. These germs grow and cause swelling (inflammation) of your urinary tract. What increases the risk? You are more likely to develop this condition if:  You have a small, thin tube (catheter) to drain pee.  You cannot control when you pee or poop (incontinence).  You are female, and: ? You use these methods to prevent pregnancy: ? A medicine that kills sperm (spermicide). ? A device that blocks sperm (diaphragm). ? You have low levels of a female hormone (estrogen). ? You are pregnant.  You have genes that add to your risk.  You are sexually active.  You take antibiotic medicines.  You have trouble peeing because of: ? A prostate that is bigger than normal, if you are female. ? A blockage in the part of your body that drains pee from the bladder (urethra). ? A kidney stone. ? A nerve condition that affects your bladder (neurogenic bladder). ? Not getting enough to drink. ? Not peeing often enough.  You have other conditions, such as: ? Diabetes. ? A weak disease-fighting system (immune system). ? Sickle cell disease. ? Gout. ? Injury of the spine. What are the signs or symptoms? Symptoms of this condition include:  Needing to pee right away (urgently).  Peeing often.  Peeing small amounts often.  Pain or burning  when peeing.  Blood in the pee.  Pee that smells bad or not like normal.  Trouble peeing.  Pee that is cloudy.  Fluid coming from the vagina, if you are female.  Pain in the belly or lower back. Other symptoms include:  Throwing up (vomiting).  No urge to eat.  Feeling mixed up (confused).  Being tired and grouchy (irritable).  A fever.  Watery poop (diarrhea). How is this treated? This condition may be treated with:  Antibiotic medicine.  Other medicines.  Drinking enough water. Follow these instructions at home:  Medicines  Take over-the-counter and prescription medicines only as told by your doctor.  If you were prescribed an antibiotic medicine, take it as told by your doctor. Do not stop taking it even if you start to feel better. General instructions  Make sure you: ? Pee until your bladder is empty. ? Do not hold pee for a long time. ? Empty your bladder after sex. ? Wipe from front to back after pooping if you are a female. Use each tissue one time when you wipe.  Drink enough fluid to keep your pee pale yellow.  Keep all follow-up visits as told by your doctor. This is important. Contact a doctor if:  You do not get better after 1-2 days.  Your symptoms go away and then come back. Get help right away if:  You have very bad back pain.  You have very  bad pain in your lower belly.  You have a fever.  You are sick to your stomach (nauseous).  You are throwing up. Summary  A urinary tract infection (UTI) is an infection of any part of the urinary tract.  This condition is caused by germs in your genital area.  There are many risk factors for a UTI. These include having a small, thin tube to drain pee and not being able to control when you pee or poop.  Treatment includes antibiotic medicines for germs.  Drink enough fluid to keep your pee pale yellow. This information is not intended to replace advice given to you by your health care  provider. Make sure you discuss any questions you have with your health care provider. Document Released: 05/18/2008 Document Revised: 11/17/2018 Document Reviewed: 06/09/2018 Elsevier Patient Education  [employer].   If you have lab work done today you will be contacted with your lab results within the next 2 weeks.  If you have not heard from [place] then please contact us. The fastest way to get your results is to register for My Chart.   IF you received an x-ray today, you will receive an invoice from [facility]. Please contact [facility] at [phone] with questions or concerns regarding your invoice.   IF you received labwork today, you will receive an invoice from [facility]. Please contact [facility] at [phone] with questions or concerns regarding your invoice.   Our billing staff will not be able to assist you with questions regarding bills from these companies.  You will be contacted with the lab results as soon as they are available. The fastest way to get your results is to activate your My Chart account. Instructions are located on the last page of this paperwork. If you have not heard from [place] regarding the results in 2 weeks, please contact this office.

## 2019-12-11 NOTE — Progress Notes (Signed)
Patient ID: Paula Meyer, female    DOB: Jul 27, 1951  Age: 68 y.o. MRN: [id number]  Chief Complaint  Patient presents with  . possible UTI    patient states she has noticed some frequent urination and also alot of pressure in lower abdomin. used a cranberry supplement for two days pt thought it worked , but the problem came back.    Subjective:   Patient has been having dysuria for about 10 days.  She took some cranberry that her daughter gave her.  She also goes a little more frequently, but that may be from drinking so much more.  No fever or chills or flank pain or nausea or vomiting or abdominal pain.  Current allergies, medications, problem list, past/family and social histories reviewed.  Objective:  BP (!) 150/76 (BP Location: Right Arm, Patient Position: Sitting, Cuff Size: Large)   Pulse 98   Temp 98.1 F (36.7 C) (Temporal)   Ht 5\' 4"  (1.626 m)   Wt 180 lb (81.6 kg)   LMP 04/02/2011   SpO2 98%   BMI 30.90 kg/m   No CVA tenderness.  Abdomen soft without mass or tenderness Results for orders placed or performed in visit on 12/11/19  POCT urinalysis dipstick  Result Value Ref Range   Color, UA yellow yellow   Clarity, UA clear clear   Glucose, UA negative negative mg/dL   Bilirubin, UA negative negative   Ketones, POC UA negative negative mg/dL   Spec Grav, UA 1.025 1.010 - 1.025   Blood, UA negative negative   pH, UA 5.5 5.0 - 8.0   Protein Ur, POC negative negative mg/dL   Urobilinogen, UA 0.2 0.2 or 1.0 E.U./dL   Nitrite, UA Negative Negative   Leukocytes, UA Trace (A) Negative     Assessment & Plan:   Assessment: 1. Dysuria       Plan: We discussed whether to give her anything waiting the results or not.  Being a holiday week and her persistent with symptoms decided to give her Macrobid for a few days for the bacteria in the urine follow-up the culture is pending.  Orders Placed This Encounter  Procedures  . Urine culture    Order Specific  Question:   Source    Answer:   Clean catch urine  . POCT urinalysis dipstick  . POCT Microscopic Urinalysis ([facility])    Meds ordered this encounter  Medications  . nitrofurantoin, macrocrystal-monohydrate, (MACROBID) 100 MG capsule    Sig: Take 1 capsule (100 mg total) by mouth 2 (two) times daily.    Dispense:  6 capsule    Refill:  0         Patient Instructions    Continue what you have been doing with drinking plenty of fluids  If you get abruptly worse at any time with fever and chills get back to [place] or go to an ER or urgent care if needed.  We will try and let you know the results of the culture a few days  Take the nitrofurantoin (Macrobid) 1 twice daily for 3 days   If you have lab work done today you will be contacted with your lab results within the next 2 weeks.  If you have not heard from [place] then please contact us. The fastest way to get your results is to register for My Chart.   IF you received an x-ray today, you will receive an invoice from [facility]. Please contact [facility] at [phone] with  questions or concerns regarding your invoice.   IF you received labwork today, you will receive an invoice from [place]. Please contact [facility] at [phone] with questions or concerns regarding your invoice.   Our billing staff will not be able to assist you with questions regarding bills from these companies.  You will be contacted with the lab results as soon as they are available. The fastest way to get your results is to activate your My Chart account. Instructions are located on the last page of this paperwork. If you have not heard from [place] regarding the results in 2 weeks, please contact this office.        Return if symptoms worsen or fail to improve.   Ruben Reason, MD 12/11/2019

## 2019-12-13 LAB — URINE CULTURE

## 2020-01-02 ENCOUNTER — Ambulatory Visit: Payer: BC Managed Care – PPO | Admitting: Family Medicine

## 2020-01-10 ENCOUNTER — Ambulatory Visit: Payer: BC Managed Care – PPO | Admitting: Family Medicine

## 2020-01-15 ENCOUNTER — Ambulatory Visit: Payer: BC Managed Care – PPO | Admitting: Family Medicine

## 2020-01-15 ENCOUNTER — Other Ambulatory Visit: Payer: Self-pay

## 2020-01-15 ENCOUNTER — Telehealth (INDEPENDENT_AMBULATORY_CARE_PROVIDER_SITE_OTHER): Payer: Medicare Other | Admitting: Family Medicine

## 2020-01-15 DIAGNOSIS — E785 Hyperlipidemia, unspecified: Secondary | ICD-10-CM

## 2020-01-15 DIAGNOSIS — I10 Essential (primary) hypertension: Secondary | ICD-10-CM | POA: Diagnosis not present

## 2020-01-15 DIAGNOSIS — J45909 Unspecified asthma, uncomplicated: Secondary | ICD-10-CM | POA: Diagnosis not present

## 2020-01-15 DIAGNOSIS — R0683 Snoring: Secondary | ICD-10-CM | POA: Diagnosis not present

## 2020-01-15 DIAGNOSIS — L309 Dermatitis, unspecified: Secondary | ICD-10-CM

## 2020-01-15 DIAGNOSIS — R5383 Other fatigue: Secondary | ICD-10-CM | POA: Diagnosis not present

## 2020-01-15 DIAGNOSIS — D518 Other vitamin B12 deficiency anemias: Secondary | ICD-10-CM | POA: Diagnosis not present

## 2020-01-15 DIAGNOSIS — E559 Vitamin D deficiency, unspecified: Secondary | ICD-10-CM | POA: Diagnosis not present

## 2020-01-15 MED ORDER — TRIAMCINOLONE ACETONIDE 0.1 % EX CREA: 1.0000 "application " | TOPICAL_CREAM | Freq: Two times a day (BID) | CUTANEOUS | 0 refills | Status: DC

## 2020-01-15 MED ORDER — DULOXETINE HCL 30 MG PO CPEP: 30.0000 mg | ORAL_CAPSULE | Freq: Every day | ORAL | 3 refills | Status: DC

## 2020-01-15 NOTE — Progress Notes (Signed)
Virtual Visit Note  I connected with patent on 01/15/20 at 439pm by video doximity and verified that I am speaking with the correct person using two identifiers. Paula Meyer is currently located at home and patient is currently with them during visit. The provider, Rutherford Guys, MD is located in their office at time of visit.  I discussed the limitations, risks, security and privacy concerns of performing an evaluation and management service by telephone and the availability of in person appointments. I also discussed with the patient that there may be a patient responsible charge related to this service. The patient expressed understanding and agreed to proceed.   CC: routine followup  HPI ? PMH: HTN, asthma, OA, HLP, GERD, vitamin D deficiency, anxiety   Last OV Oct 2020 - changed to atorvastatin  Checks her BP at home Has been doing well BP 120/70s Takes telmisartan and HCTZ  Has been on atorvastatin for past 2 weeks Tolerating well  Asthma has been overall doing well At the beginning of winter was using albuterol more frequent but recently started taking zyrtec and eye allergy drops She used to go to allergist for injections She would like to see them again  She is also having several dry itchy patches of skin  She is feeling tired recently Snores loudly, but denies waking up with headaches or falling asleep easily during the day She is doing intermittent fasting She has h/o depression, did well on cymbalta, feels this might be depression starting again   Allergies  Allergen Reactions  . Paxil [Paroxetine Hcl]     Makes her grind her teeth  . Bupropion Hcl Other (See Comments)    palpitations  . Contrast Media [Iodinated Diagnostic Agents] Hives and Itching  . Lisinopril     cough  . Tetracycline Nausea Only    Prior to Admission medications   Medication Sig Start Date End Date Taking? Authorizing Provider  albuterol (VENTOLIN HFA) 108 (90 Base)  MCG/ACT inhaler INHALE 2 PUFFS INTO THE LUNGS EVERY 4 HOURS AS NEEDED FOR COUGH, WHEEZING OR SHORTNESS OF BREATH 07/12/19  Yes Rutherford Guys, MD  aspirin EC 81 MG tablet Take 1 tablet (81 mg total) by mouth daily. 08/24/18  Yes [name], MD  atorvastatin (LIPITOR) 20 MG tablet Take 1 tablet (20 mg total) by mouth daily. 10/10/19  Yes Rutherford Guys, MD  Azelastine HCl 0.15 % SOLN USE 1 SPRAY IN THE NOSE TWICE DAILY 09/28/18  Yes [name], MD  diclofenac sodium (VOLTAREN) 1 % GEL Apply 2 g topically 4 (four) times daily. 10/10/19  Yes Rutherford Guys, MD  hydrochlorothiazide (HYDRODIURIL) 25 MG tablet TAKE 1 TABLET BY MOUTH EVERY DAY 11/28/19  Yes Rutherford Guys, MD  MIMVEY 1-0.5 MG tablet Take 1 tablet by mouth daily. prn 06/17/16  Yes [provider]  nitrofurantoin, macrocrystal-monohydrate, (MACROBID) 100 MG capsule Take 1 capsule (100 mg total) by mouth 2 (two) times daily. 12/11/19  Yes Posey Boyer, MD  telmisartan (MICARDIS) 40 MG tablet TAKE 1 TABLET BY MOUTH EVERY DAY 11/03/19  Yes Rutherford Guys, MD  Turmeric 500 MG CAPS Take 1 capsule by mouth daily. 08/23/18  Yes [name], MD    Past Medical History:  Diagnosis Date  . Allergy   . Anxiety   . Asthma   . Depression   . Hx of colonic polyps   . Hyperlipidemia   . Hypertension   . Osteoarthritis  Past Surgical History:  Procedure Laterality Date  . TUBAL LIGATION      Social History   Tobacco Use  . Smoking status: Former Research scientist (life sciences)  . Smokeless tobacco: Never Used  Substance Use Topics  . Alcohol use: Yes    Alcohol/week: 3.0 standard drinks    Types: 3 Glasses of wine per week    Comment: rarely    Family History  Problem Relation Age of Onset  . Hyperlipidemia Brother   . Hypertension Brother   . Arthritis Other   . Depression Other   . Colon cancer Neg Hx   . Stomach cancer Neg Hx     Review of Systems  Constitutional: Negative for chills and fever.  Respiratory:  Negative for cough and shortness of breath.   Cardiovascular: Negative for chest pain, palpitations and leg swelling.  Gastrointestinal: Negative for abdominal pain, nausea and vomiting.    Objective  Vitals as reported by the patient: none  GEN: AAOx3, NAD HEENT: [place]/AT, pupils are symmetrical, EOMI, non-icteric sclera Resp: breathing comfortably, speaking in full sentences Skin: no rashes noted, no pallor Psych: good eye contact, normal mood and affect   ASSESSMENT and PLAN  1. Asthma due to seasonal allergies amb ref allergy as requested  2. Eczema, unspecified type Discussed skin care. rx triamcinolone given. Reviewed r/se/b  3. Essential hypertension, benign Controlled. Continue current regime.   4. Vitamin D deficiency disease - VITAMIN D 25 Hydroxy (Vit-D Deficiency, Fractures); Future  5. Other vitamin B12 deficiency anemia - CBC; Future - Vitamin B12; Future  6. Fatigue, unspecified type Restarting antidepressants. Labs pending, consider sleep eval. - TSH; Future  7. Hyperlipidemia with target LDL less than 130 Checking labs today, medications will be adjusted as needed.  - Comprehensive metabolic panel; Future - Lipid panel; Future  8. Snoring Consider sleep eval if fatigue does not improve with restating cymbalta  Other orders - triamcinolone cream (KENALOG) 0.1 %; Apply 1 application topically 2 (two) times daily. - DULoxetine (CYMBALTA) 30 MG capsule; Take 1 capsule (30 mg total) by mouth daily.  FOLLOW-UP: 4 weeks   The above assessment and management plan was discussed with the patient. The patient verbalized understanding of and has agreed to the management plan. Patient is aware to call the clinic if symptoms persist or worsen. Patient is aware when to return to the clinic for a follow-up visit. Patient educated on when it is appropriate to go to the emergency department.    I provided 23 minutes of non-face-to-face time during this  encounter.  Rutherford Guys, MD Primary Care at [place],  [address] Ph.  [phone] Fax [phone]

## 2020-01-15 NOTE — Progress Notes (Signed)
Pt is following up on bp, she is asking is she to continue on the hctz. Also, she is having rash break outs on her leg and arm that is causing lots of itching. She is also having itching in the eyes. She is keeping the skin clean with alcohol, using neosporin, using zyrtec for the eyes. Not sure if she is having a reaction to the meds she is taking.

## 2020-01-23 ENCOUNTER — Encounter: Payer: Self-pay | Admitting: Allergy and Immunology

## 2020-01-23 ENCOUNTER — Other Ambulatory Visit: Payer: Self-pay

## 2020-01-23 ENCOUNTER — Ambulatory Visit (INDEPENDENT_AMBULATORY_CARE_PROVIDER_SITE_OTHER): Payer: Medicare Other | Admitting: Allergy and Immunology

## 2020-01-23 VITALS — BP 126/76 | HR 72 | Temp 96.8°F | Resp 17 | Ht 65.0 in | Wt 180.5 lb

## 2020-01-23 DIAGNOSIS — H1013 Acute atopic conjunctivitis, bilateral: Secondary | ICD-10-CM

## 2020-01-23 DIAGNOSIS — J4530 Mild persistent asthma, uncomplicated: Secondary | ICD-10-CM

## 2020-01-23 DIAGNOSIS — J3089 Other allergic rhinitis: Secondary | ICD-10-CM | POA: Diagnosis not present

## 2020-01-23 DIAGNOSIS — H1010 Acute atopic conjunctivitis, unspecified eye: Secondary | ICD-10-CM | POA: Insufficient documentation

## 2020-01-23 MED ORDER — AZELASTINE-FLUTICASONE 137-50 MCG/ACT NA SUSP: 1.0000 | Freq: Two times a day (BID) | NASAL | 5 refills | Status: DC | PRN

## 2020-01-23 MED ORDER — FEXOFENADINE HCL 180 MG PO TABS: 180.0000 mg | ORAL_TABLET | Freq: Every day | ORAL | 5 refills | Status: DC | PRN

## 2020-01-23 MED ORDER — OLOPATADINE HCL 0.2 % OP SOLN: 1.0000 [drp] | Freq: Every day | OPHTHALMIC | 5 refills | Status: DC | PRN

## 2020-01-23 MED ORDER — FLOVENT HFA 110 MCG/ACT IN AERO: 2.0000 | INHALATION_SPRAY | Freq: Two times a day (BID) | RESPIRATORY_TRACT | 5 refills | Status: DC

## 2020-01-23 NOTE — Assessment & Plan Note (Signed)
   Aeroallergen avoidance measures have been discussed and provided in written form.  A prescription has been provided for azelastine/fluticasone nasal spray, 1 spray per nostril twice daily as needed. Proper nasal spray technique has been discussed and demonstrated.  Nasal saline spray (i.e., Simply Saline) or nasal saline lavage (i.e., NeilMed) is recommended as needed and prior to medicated nasal sprays.  Fexofenadine (Allegra) 180 mg daily if needed.  If allergen avoidance measures and medications fail to adequately relieve symptoms, aeroallergen immunotherapy will be considered.

## 2020-01-23 NOTE — Patient Instructions (Addendum)
Perennial and seasonal allergic rhinitis with a probable nonallergic component  Aeroallergen avoidance measures have been discussed and provided in written form.  A prescription has been provided for azelastine/fluticasone nasal spray, 1 spray per nostril twice daily as needed. Proper nasal spray technique has been discussed and demonstrated.  Nasal saline spray (i.e., Simply Saline) or nasal saline lavage (i.e., NeilMed) is recommended as needed and prior to medicated nasal sprays.  Fexofenadine (Allegra) 180 mg daily if needed.  If allergen avoidance measures and medications fail to adequately relieve symptoms, aeroallergen immunotherapy will be considered.  Allergic conjunctivitis  Treatment plan as outlined above for allergic rhinitis.  A prescription has been provided for Pataday, one drop per eye daily as needed.  I have also recommended eye lubricant drops (i.e., Natural Tears) as needed.  Mild persistent asthma  A prescription has been provided for Flovent (fluticasone) 110 g, 2 inhalations twice a day. To maximize pulmonary deposition, a spacer has been provided along with instructions for its proper administration with an HFA inhaler.  Continue albuterol HFA, 1 to 2 inhalations every 4-6 hours if needed.  Subjective and objective measures of pulmonary function will be followed and the treatment plan will be adjusted accordingly.   Return in about 2 months (around 03/22/2020), or if symptoms worsen or fail to improve.  Control of House Dust Mite Allergen  House dust mites play a major role in allergic asthma and rhinitis.  They occur in environments with high humidity wherever human skin, the food for dust mites is found. High levels have been detected in dust obtained from mattresses, pillows, carpets, upholstered furniture, bed covers, clothes and soft toys.  The principal allergen of the house dust mite is found in its feces.  A gram of dust may contain 1,000 mites and  250,000 fecal particles.  Mite antigen is easily measured in the air during house cleaning activities.    1. Encase mattresses, including the box spring, and pillow, in an air tight cover.  Seal the zipper end of the encased mattresses with wide adhesive tape. 2. Wash the bedding in water of 130 degrees Farenheit weekly.  Avoid cotton comforters/quilts and flannel bedding: the most ideal bed covering is the dacron comforter. 3. Remove all upholstered furniture from the bedroom. 4. Remove carpets, carpet padding, rugs, and non-washable window drapes from the bedroom.  Wash drapes weekly or use plastic window coverings. 5. Remove all non-washable stuffed toys from the bedroom.  Wash stuffed toys weekly. 6. Have the room cleaned frequently with a vacuum cleaner and a damp dust-mop.  The patient should not be in a room which is being cleaned and should wait 1 hour after cleaning before going into the room. 7. Close and seal all heating outlets in the bedroom.  Otherwise, the room will become filled with dust-laden air.  An electric heater can be used to heat the room. 8. Reduce indoor humidity to less than 50%.  Do not use a humidifier.  Reducing Pollen Exposure  The American Academy of Allergy, Asthma and Immunology suggests the following steps to reduce your exposure to pollen during allergy seasons.    1. Do not hang sheets or clothing out to dry; pollen may collect on these items. 2. Do not mow lawns or spend time around freshly cut grass; mowing stirs up pollen. 3. Keep windows closed at night.  Keep car windows closed while driving. 4. Minimize morning activities outdoors, a time when pollen counts are usually at their highest. 5.  Stay indoors as much as possible when pollen counts or humidity is high and on windy days when pollen tends to remain in the air longer. 6. Use air conditioning when possible.  Many air conditioners have filters that trap the pollen spores. 7. Use a HEPA room air  filter to remove pollen form the indoor air you breathe.   Control of Cockroach Allergen  Cockroach allergen has been identified as an important cause of acute attacks of asthma, especially in urban settings.  There are fifty-five species of cockroach that exist in the [place], however only three, the [place], [place] species produce allergen that can affect patients with Asthma.  Allergens can be obtained from fecal particles, egg casings and secretions from cockroaches.    1. Remove food sources. 2. Reduce access to water. 3. Seal access and entry points. 4. Spray runways with 0.5-1% Diazinon or Chlorpyrifos 5. Blow boric acid power under stoves and refrigerator. 6. Place bait stations (hydramethylnon) at feeding sites.

## 2020-01-23 NOTE — Assessment & Plan Note (Signed)
   A prescription has been provided for Flovent (fluticasone) 110 g, 2 inhalations twice a day. To maximize pulmonary deposition, a spacer has been provided along with instructions for its proper administration with an HFA inhaler.  Continue albuterol HFA, 1 to 2 inhalations every 4-6 hours if needed.  Subjective and objective measures of pulmonary function will be followed and the treatment plan will be adjusted accordingly.

## 2020-01-23 NOTE — Assessment & Plan Note (Signed)
   Treatment plan as outlined above for allergic rhinitis.  A prescription has been provided for Pataday, one drop per eye daily as needed.  I have also recommended eye lubricant drops (i.e., Natural Tears) as needed. 

## 2020-01-23 NOTE — Progress Notes (Signed)
New Patient Note  RE: Paula Meyer MRN: [id number] DOB: [birth year] Date of Office Visit: 01/23/2020  Referring provider: Rutherford Guys, MD Primary care provider: Rutherford Guys, MD  Chief Complaint: Asthma and Allergic Rhinitis   History of present illness: Paula Meyer is a 69 y.o. female seen today in consultation requested by Grant Fontana, MD.  She reports that over the past 20 years she has experienced episodes of wheezing, chest tightness, coughing, and dyspnea.  These lower respiratory symptoms are triggered by weather changes, particularly when the front moves then prior to rain, as well as heat, pollen, dust, and strong aromas.  She reports that some weeks she experiences no asthma symptoms at all in another week she experiences symptoms daily.  Albuterol provides temporary relief.  She does not experience nocturnal awakenings due to lower respiratory symptoms. She reports that since May 2020 she has experienced ocular pruritus, right eye irritation, rhinorrhea, as well as occasional postnasal drainage, nasal congestion, and sinus pressure.  No significant seasonal symptom variation has been noted nor have specific environmental triggers been identified.  She takes loratadine and an over-the-counter eyedrop with relief.  In addition, she will use warm compresses on her eyes from time to time with benefit.  Assessment and plan: Perennial and seasonal allergic rhinitis with a probable nonallergic component  Aeroallergen avoidance measures have been discussed and provided in written form.  A prescription has been provided for azelastine/fluticasone nasal spray, 1 spray per nostril twice daily as needed. Proper nasal spray technique has been discussed and demonstrated.  Nasal saline spray (i.e., Simply Saline) or nasal saline lavage (i.e., NeilMed) is recommended as needed and prior to medicated nasal sprays.  Fexofenadine (Allegra) 180 mg daily if needed.  If allergen  avoidance measures and medications fail to adequately relieve symptoms, aeroallergen immunotherapy will be considered.  Allergic conjunctivitis  Treatment plan as outlined above for allergic rhinitis.  A prescription has been provided for Pataday, one drop per eye daily as needed.  I have also recommended eye lubricant drops (i.e., Natural Tears) as needed.  Mild persistent asthma  A prescription has been provided for Flovent (fluticasone) 110 g, 2 inhalations twice a day. To maximize pulmonary deposition, a spacer has been provided along with instructions for its proper administration with an HFA inhaler.  Continue albuterol HFA, 1 to 2 inhalations every 4-6 hours if needed.  Subjective and objective measures of pulmonary function will be followed and the treatment plan will be adjusted accordingly.   Meds ordered this encounter  Medications  . Azelastine-Fluticasone 137-50 MCG/ACT SUSP    Sig: Place 1 spray into the nose 2 (two) times daily as needed.    Dispense:  23 g    Refill:  5  . fexofenadine (ALLEGRA) 180 MG tablet    Sig: Take 1 tablet (180 mg total) by mouth daily as needed for allergies or rhinitis.    Dispense:  30 tablet    Refill:  5  . Olopatadine HCl (PATADAY) 0.2 % SOLN    Sig: Place 1 drop into both eyes daily as needed.    Dispense:  2.5 mL    Refill:  5  . fluticasone (FLOVENT HFA) 110 MCG/ACT inhaler    Sig: Inhale 2 puffs into the lungs 2 (two) times daily.    Dispense:  1 Inhaler    Refill:  5    Diagnostics: Spirometry: FVC was 2.91 L and FEV1 was 2.08 L with 160 mL postbronchodilator  improvement.  See spirometry asymptomatic Epicutaneous testing: Positive to tree pollen. Intradermal testing: Positive to grass pollen, cockroach antigen, and dust mite antigen.   Physical examination: Blood pressure 126/76, pulse 72, temperature (!) 96.8 F (36 C), temperature source Temporal, resp. rate 17, height 5\' 5"  (1.651 m), weight 180 lb 8 oz (81.9 kg),  last menstrual period 04/02/2011, SpO2 97 %.  General: Alert, interactive, in no acute distress. HEENT: TMs pearly gray, turbinates moderately edematous without discharge, post-pharynx unremarkable. Neck: Supple without lymphadenopathy. Lungs: Clear to auscultation without wheezing, rhonchi or rales. CV: Normal S1, S2 without murmurs. Abdomen: Nondistended, nontender. Skin: Warm and dry, without lesions or rashes. Extremities:  No clubbing, cyanosis or edema. Neuro:   Grossly intact.  Review of systems:  Review of systems negative except as noted in HPI / PMHx or noted below: Review of Systems  Constitutional: Negative.   HENT: Negative.   Eyes: Negative.   Respiratory: Negative.   Cardiovascular: Negative.   Gastrointestinal: Negative.   Genitourinary: Negative.   Musculoskeletal: Negative.   Skin: Negative.   Neurological: Negative.   Endo/Heme/Allergies: Negative.   Psychiatric/Behavioral: Negative.     Past medical history:  Past Medical History:  Diagnosis Date  . Allergy   . Anxiety   . Asthma   . Depression   . Hx of colonic polyps   . Hyperlipidemia   . Hypertension   . Osteoarthritis     Past surgical history:  Past Surgical History:  Procedure Laterality Date  . TUBAL LIGATION    . WISDOM TOOTH EXTRACTION      Family history: Family History  Problem Relation Age of Onset  . Hyperlipidemia Brother   . Hypertension Brother   . Arthritis Other   . Depression Other   . Colon cancer Neg Hx   . Stomach cancer Neg Hx     Social history: Social History   Socioeconomic History  . Marital status: Divorced    Spouse name: Not on file  . Number of children: Not on file  . Years of education: Not on file  . Highest education level: Not on file  Occupational History  . Not on file  Tobacco Use  . Smoking status: Former Research scientist (life sciences)  . Smokeless tobacco: Never Used  Substance and Sexual Activity  . Alcohol use: Yes    Alcohol/week: 3.0 standard drinks      Types: 3 Glasses of wine per week    Comment: rarely  . Drug use: No  . Sexual activity: Not Currently  Other Topics Concern  . Not on file  Social History Narrative  . Not on file   Social Determinants of Health   Financial Resource Strain:   . Difficulty of Paying Living Expenses: Not on file  Food Insecurity:   . Worried About Charity fundraiser in the Last Year: Not on file  . Ran Out of Food in the Last Year: Not on file  Transportation Needs:   . Lack of Transportation (Medical): Not on file  . Lack of Transportation (Non-Medical): Not on file  Physical Activity:   . Days of Exercise per Week: Not on file  . Minutes of Exercise per Session: Not on file  Stress:   . Feeling of Stress : Not on file  Social Connections:   . Frequency of Communication with Friends and Family: Not on file  . Frequency of Social Gatherings with Friends and Family: Not on file  . Attends Religious Services: Not on file  .  Active Member of Clubs or Organizations: Not on file  . Attends Archivist Meetings: Not on file  . Marital Status: Not on file  Intimate Partner Violence:   . Fear of Current or Ex-Partner: Not on file  . Emotionally Abused: Not on file  . Physically Abused: Not on file  . Sexually Abused: Not on file    Environmental History: The patient lives in an 67 year old house with carpeting in the bedroom, gassy, and central air.  There is no known mold/water damage in the home.  There is a dog in the home which has access to her bedroom.  She is a non-smoker.  Current Outpatient Medications  Medication Sig Dispense Refill  . albuterol (VENTOLIN HFA) 108 (90 Base) MCG/ACT inhaler INHALE 2 PUFFS INTO THE LUNGS EVERY 4 HOURS AS NEEDED FOR COUGH, WHEEZING OR SHORTNESS OF BREATH 6.7 g 1  . aspirin EC 81 MG tablet Take 1 tablet (81 mg total) by mouth daily. 90 tablet 1  . atorvastatin (LIPITOR) 20 MG tablet Take 1 tablet (20 mg total) by mouth daily. 90 tablet 3  .  Azelastine HCl 0.15 % SOLN USE 1 SPRAY IN THE NOSE TWICE DAILY 90 mL 1  . diclofenac sodium (VOLTAREN) 1 % GEL Apply 2 g topically 4 (four) times daily. 100 g 1  . DULoxetine (CYMBALTA) 30 MG capsule Take 1 capsule (30 mg total) by mouth daily. 30 capsule 3  . hydrochlorothiazide (HYDRODIURIL) 25 MG tablet TAKE 1 TABLET BY MOUTH EVERY DAY 90 tablet 1  . MIMVEY 1-0.5 MG tablet Take 1 tablet by mouth daily. prn  3  . nystatin (MYCOSTATIN/NYSTOP) powder APPLY TO AFFECTED AREA TWICE A DAY    . rosuvastatin (CRESTOR) 10 MG tablet Take 10 mg by mouth at bedtime.    [name] Kitchen telmisartan (MICARDIS) 40 MG tablet TAKE 1 TABLET BY MOUTH EVERY DAY 30 tablet 2  . triamcinolone cream (KENALOG) 0.1 % Apply 1 application topically 2 (two) times daily. 30 g 0  . Turmeric 500 MG CAPS Take 1 capsule by mouth daily. 90 capsule 1  . Azelastine-Fluticasone 137-50 MCG/ACT SUSP Place 1 spray into the nose 2 (two) times daily as needed. 23 g 5  . fexofenadine (ALLEGRA) 180 MG tablet Take 1 tablet (180 mg total) by mouth daily as needed for allergies or rhinitis. 30 tablet 5  . fluticasone (FLOVENT HFA) 110 MCG/ACT inhaler Inhale 2 puffs into the lungs 2 (two) times daily. 1 Inhaler 5  . Olopatadine HCl (PATADAY) 0.2 % SOLN Place 1 drop into both eyes daily as needed. 2.5 mL 5   No current facility-administered medications for this visit.    Known medication allergies: Allergies  Allergen Reactions  . Paxil [Paroxetine Hcl]     Makes her grind her teeth  . Bupropion Hcl Other (See Comments)    palpitations  . Contrast Media [Iodinated Diagnostic Agents] Hives and Itching  . Lisinopril     cough  . Tetracycline Nausea Only    I appreciate the opportunity to take part in Paula Meyer's care. Please do not hesitate to contact me with questions.  Sincerely,   R. Edgar Frisk, MD

## 2020-02-04 ENCOUNTER — Ambulatory Visit: Payer: BC Managed Care – PPO | Attending: Internal Medicine

## 2020-02-04 DIAGNOSIS — Z23 Encounter for immunization: Secondary | ICD-10-CM | POA: Insufficient documentation

## 2020-02-04 NOTE — Progress Notes (Signed)
   Covid-19 Vaccination Clinic  Name:  Paula Meyer    MRN: [id number] DOB: 1951/05/09  02/04/2020  [name] was observed post Covid-19 immunization for 30 minutes based on pre-vaccination screening without incidence. She was provided with Vaccine Information Sheet and instruction to access the V-Safe system.   [name] was instructed to call 911 with any severe reactions post vaccine: [name] Kitchen Difficulty breathing  . Swelling of your face and throat  . A fast heartbeat  . A bad rash all over your body  . Dizziness and weakness    Immunizations Administered    Name Date Dose VIS Date Route   Pfizer COVID-19 Vaccine 02/04/2020  2:00 PM 0.3 mL 11/24/2019 Intramuscular   Manufacturer: [place]   Lot: [id number]   [place]: [id number]

## 2020-02-10 ENCOUNTER — Other Ambulatory Visit: Payer: Self-pay | Admitting: Family Medicine

## 2020-02-27 ENCOUNTER — Telehealth: Payer: Self-pay | Admitting: Family Medicine

## 2020-02-27 NOTE — Telephone Encounter (Signed)
Pt would still like to have her AWV with you  Looks like she missed your call

## 2020-02-28 ENCOUNTER — Ambulatory Visit: Payer: BC Managed Care – PPO | Attending: Internal Medicine

## 2020-02-28 DIAGNOSIS — Z23 Encounter for immunization: Secondary | ICD-10-CM

## 2020-02-28 NOTE — Progress Notes (Signed)
   Covid-19 Vaccination Clinic  Name:  KEELA PLUTH    MRN: [id number] DOB: 1951/03/23  02/28/2020  Ms. Shelburn was observed post Covid-19 immunization for 15 minutes without incident. She was provided with Vaccine Information Sheet and instruction to access the V-Safe system.   Ms. Cianciulli was instructed to call 911 with any severe reactions post vaccine: [name] Kitchen Difficulty breathing  . Swelling of face and throat  . A fast heartbeat  . A bad rash all over body  . Dizziness and weakness   Immunizations Administered    Name Date Dose VIS Date Route   Pfizer COVID-19 Vaccine 02/28/2020 10:52 AM 0.3 mL 11/24/2019 Intramuscular   Manufacturer: [place]   Lot: [id number]   [place]: [id number]

## 2020-02-29 ENCOUNTER — Ambulatory Visit (INDEPENDENT_AMBULATORY_CARE_PROVIDER_SITE_OTHER): Payer: Medicare Other | Admitting: Family Medicine

## 2020-02-29 VITALS — BP 126/76 | Ht 65.0 in | Wt 180.0 lb

## 2020-02-29 DIAGNOSIS — Z0000 Encounter for general adult medical examination without abnormal findings: Secondary | ICD-10-CM | POA: Diagnosis not present

## 2020-02-29 NOTE — Progress Notes (Signed)
Presents today for [employer] Visit   Date of last exam: 01/15/2020   Interpreter used for this visit? No  I connected with  Paula Meyer on 02/29/20 by atelephone telemedicine application and verified that I am speaking with the correct person using two identifiers.   I discussed the limitations of evaluation and management by telemedicine. The patient expressed understanding and agreed to proceed.    Patient Care Team: Rutherford Guys, MD as PCP - General (Family Medicine)   Other items to address today:   Discussed Eye/Dental Discussed immunizations Patient will come in for future labs and schedule a follow up. Patient stopped taking the Cymbalta stated made her feel funny.   Will discuss at future appointment but feels good with not taking medication at this time.   Other Screening: Last screening for diabetes: 11/16/2018 Last lipid screening: 07/06/2019  ADVANCE DIRECTIVES: Discussed:  yes On File:no Materials Provided: yes  Immunization status:  Immunization History  Administered Date(s) Administered  . Influenza, High Dose Seasonal PF 01/12/2019  . PFIZER SARS-COV-2 Vaccination 02/04/2020, 02/28/2020  . Pneumococcal Conjugate-13 08/23/2018  . Pneumococcal Polysaccharide-23 07/10/2013  . Tdap 04/02/2011     Health Maintenance Due  Topic Date Due  . PNA vac Low Risk Adult (2 of 2 - PPSV23) 08/24/2019     Functional Status Survey: Is the patient deaf or have difficulty hearing?: Yes(ringing in ears) Does the patient have difficulty seeing, even when wearing glasses/contacts?: No Does the patient have difficulty concentrating, remembering, or making decisions?: No Does the patient have difficulty walking or climbing stairs?: No Does the patient have difficulty dressing or bathing?: No Does the patient have difficulty doing errands alone such as visiting a doctor's office or shopping?: No   6CIT Screen 02/29/2020  What Year? 0  points  What month? 0 points  What time? 0 points  Count back from 20 0 points  Months in reverse 0 points  Repeat phrase 0 points  Total Score 0         Home Environment:   No trouble climbing stairs Lives in two story home Non skid rugs No grab bars Adequate lighting/ no clutter   Patient Active Problem List   Diagnosis Date Noted  . Allergic conjunctivitis 01/23/2020  . Frozen shoulder 11/02/2018  . Degenerative disc disease, cervical 11/02/2018  . Nausea in adult 10/24/2018  . Chronic right shoulder pain 10/24/2018  . Constipation 08/24/2018  . Vitamin D deficiency disease 08/23/2018  . Morbid obesity ([place]) 03/17/2018  . Seasonal affective disorder ([place]) 11/03/2017  . GAD (generalized anxiety disorder) 11/03/2017  . Other screening mammogram 07/10/2013  . B12 deficiency anemia 08/24/2012  . GERD (gastroesophageal reflux disease) 06/30/2012  . Eczema 05/04/2012  . DDD (degenerative disc disease), lumbar 04/02/2011  . Hyperlipidemia with target LDL less than 130 10/20/2010  . Depression with anxiety 10/20/2010  . Essential hypertension, benign 10/20/2010  . Perennial and seasonal allergic rhinitis with a probable nonallergic component 10/20/2010  . Mild persistent asthma 10/20/2010  . Osteoarthritis 10/20/2010     Past Medical History:  Diagnosis Date  . Allergy   . Anxiety   . Asthma   . Depression   . Hx of colonic polyps   . Hyperlipidemia   . Hypertension   . Osteoarthritis      Past Surgical History:  Procedure Laterality Date  . TUBAL LIGATION    . WISDOM TOOTH EXTRACTION       Family History  Problem Relation Age of Onset  . Hyperlipidemia Brother   . Hypertension Brother   . Arthritis Other   . Depression Other   . Colon cancer Neg Hx   . Stomach cancer Neg Hx      Social History   Socioeconomic History  . Marital status: Divorced    Spouse name: Not on file  . Number of children: Not on file  . Years of education: Not on  file  . Highest education level: Not on file  Occupational History  . Not on file  Tobacco Use  . Smoking status: Former Research scientist (life sciences)  . Smokeless tobacco: Never Used  Substance and Sexual Activity  . Alcohol use: Yes    Alcohol/week: 3.0 standard drinks    Types: 3 Glasses of wine per week    Comment: rarely  . Drug use: No  . Sexual activity: Not Currently  Other Topics Concern  . Not on file  Social History Narrative  . Not on file   Social Determinants of Health   Financial Resource Strain:   . Difficulty of Paying Living Expenses:   Food Insecurity:   . Worried About Charity fundraiser in the Last Year:   . Arboriculturist in the Last Year:   Transportation Needs:   . Film/video editor (Medical):   [name] Kitchen Lack of Transportation (Non-Medical):   Physical Activity:   . Days of Exercise per Week:   . Minutes of Exercise per Session:   Stress:   . Feeling of Stress :   Social Connections:   . Frequency of Communication with Friends and Family:   . Frequency of Social Gatherings with Friends and Family:   . Attends Religious Services:   . Active Member of Clubs or Organizations:   . Attends Archivist Meetings:   [name] Kitchen Marital Status:   Intimate Partner Violence:   . Fear of Current or Ex-Partner:   . Emotionally Abused:   [name] Kitchen Physically Abused:   . Sexually Abused:      Allergies  Allergen Reactions  . Paxil [Paroxetine Hcl]     Makes her grind her teeth  . Bupropion Hcl Other (See Comments)    palpitations  . Contrast Media [Iodinated Diagnostic Agents] Hives and Itching  . Lisinopril     cough  . Tetracycline Nausea Only     Prior to Admission medications   Medication Sig Start Date End Date Taking? Authorizing Provider  albuterol (VENTOLIN HFA) 108 (90 Base) MCG/ACT inhaler INHALE 2 PUFFS INTO THE LUNGS EVERY 4 HOURS AS NEEDED FOR COUGH, WHEEZING OR SHORTNESS OF BREATH 07/12/19  Yes Rutherford Guys, MD  aspirin EC 81 MG tablet Take 1 tablet (81 mg  total) by mouth daily. 08/24/18  Yes [name], MD  atorvastatin (LIPITOR) 20 MG tablet Take 1 tablet (20 mg total) by mouth daily. 10/10/19  Yes Rutherford Guys, MD  Azelastine HCl 0.15 % SOLN USE 1 SPRAY IN THE NOSE TWICE DAILY 09/28/18  Yes [name], MD  Azelastine-Fluticasone 137-50 MCG/ACT SUSP Place 1 spray into the nose 2 (two) times daily as needed. 01/23/20  Yes Bobbitt, Sedalia Muta, MD  diclofenac sodium (VOLTAREN) 1 % GEL Apply 2 g topically 4 (four) times daily. 10/10/19  Yes Rutherford Guys, MD  fexofenadine (ALLEGRA) 180 MG tablet Take 1 tablet (180 mg total) by mouth daily as needed for allergies or rhinitis. 01/23/20  Yes Bobbitt, Sedalia Muta, MD  fluticasone ([place]  HFA) 110 MCG/ACT inhaler Inhale 2 puffs into the lungs 2 (two) times daily. 01/23/20  Yes Bobbitt, Sedalia Muta, MD  hydrochlorothiazide (HYDRODIURIL) 25 MG tablet TAKE 1 TABLET BY MOUTH EVERY DAY 11/28/19  Yes Rutherford Guys, MD  MIMVEY 1-0.5 MG tablet Take 1 tablet by mouth daily. prn 06/17/16  Yes [provider]  nystatin (MYCOSTATIN/NYSTOP) powder APPLY TO AFFECTED AREA TWICE A DAY 09/01/19  Yes [provider]  rosuvastatin (CRESTOR) 10 MG tablet Take 5 mg by mouth at bedtime.  01/18/20  Yes [provider]  telmisartan (MICARDIS) 40 MG tablet TAKE 1 TABLET BY MOUTH EVERY DAY 02/10/20  Yes Rutherford Guys, MD  triamcinolone cream (KENALOG) 0.1 % Apply 1 application topically 2 (two) times daily. 01/15/20  Yes Rutherford Guys, MD  DULoxetine (CYMBALTA) 30 MG capsule Take 1 capsule (30 mg total) by mouth daily. Patient not taking: Reported on 02/29/2020 01/15/20   Rutherford Guys, MD  Olopatadine HCl (PATADAY) 0.2 % SOLN Place 1 drop into both eyes daily as needed. Patient not taking: Reported on 02/29/2020 01/23/20   Bobbitt, Sedalia Muta, MD  Turmeric 500 MG CAPS Take 1 capsule by mouth daily. Patient not taking: Reported on 02/29/2020 08/23/18   [name], MD     Depression  screen [facility] 2/9 02/29/2020 01/15/2020 12/11/2019 10/10/2019 07/06/2019  Decreased Interest 0 0 0 0 0  Down, Depressed, Hopeless 0 0 0 0 0  PHQ - 2 Score 0 0 0 0 0  Altered sleeping - - - - -  Tired, decreased energy - - - - -  Change in appetite - - - - -  Feeling bad or failure about yourself  - - - - -  Trouble concentrating - - - - -  Moving slowly or fidgety/restless - - - - -  Suicidal thoughts - - - - -  PHQ-9 Score - - - - -  Difficult doing work/chores - - - - -     Fall Risk  02/29/2020 01/15/2020 12/11/2019 10/10/2019 07/06/2019  Falls in the past year? 0 0 0 0 0  Number falls in past yr: 0 0 0 0 0  Injury with Fall? 0 0 0 0 0  Follow up Falls evaluation completed;Education provided - Falls evaluation completed - Falls evaluation completed      PHYSICAL EXAM: BP 126/76 Comment: taken from previous visit  Ht 5\' 5"  (1.651 m)   Wt 180 lb (81.6 kg)   LMP 04/02/2011   BMI 29.95 kg/m    Wt Readings from Last 3 Encounters:  02/29/20 180 lb (81.6 kg)  01/23/20 180 lb 8 oz (81.9 kg)  12/11/19 180 lb (81.6 kg)    Medicare annual wellness visit, subsequent    Education/Counseling provided regarding diet and exercise, prevention of chronic diseases, smoking/tobacco cessation, if applicable, and reviewed "Covered Medicare Preventive Services."

## 2020-02-29 NOTE — Patient Instructions (Signed)
Thank you for taking time to come for your Medicare Wellness Visit. I appreciate your ongoing commitment to your health goals. Please review the following plan we discussed and let me know if I can assist you in the future.  Julie Greer LPN  Preventive Care 65 Years and Older, Female Preventive care refers to lifestyle choices and visits with your health care provider that can promote health and wellness. This includes:  A yearly physical exam. This is also called an annual well check.  Regular dental and eye exams.  Immunizations.  Screening for certain conditions.  Healthy lifestyle choices, such as diet and exercise. What can I expect for my preventive care visit? Physical exam Your health care provider will check:  Height and weight. These may be used to calculate body mass index (BMI), which is a measurement that tells if you are at a healthy weight.  Heart rate and blood pressure.  Your skin for abnormal spots. Counseling Your health care provider may ask you questions about:  Alcohol, tobacco, and drug use.  Emotional well-being.  Home and relationship well-being.  Sexual activity.  Eating habits.  History of falls.  Memory and ability to understand (cognition).  Work and work environment.  Pregnancy and menstrual history. What immunizations do I need?  Influenza (flu) vaccine  This is recommended every year. Tetanus, diphtheria, and pertussis (Tdap) vaccine  You may need a Td booster every 10 years. Varicella (chickenpox) vaccine  You may need this vaccine if you have not already been vaccinated. Zoster (shingles) vaccine  You may need this after age 60. Pneumococcal conjugate (PCV13) vaccine  One dose is recommended after age 65. Pneumococcal polysaccharide (PPSV23) vaccine  One dose is recommended after age 65. Measles, mumps, and rubella (MMR) vaccine  You may need at least one dose of MMR if you were born in 1957 or later. You may also  need a second dose. Meningococcal conjugate (MenACWY) vaccine  You may need this if you have certain conditions. Hepatitis A vaccine  You may need this if you have certain conditions or if you travel or work in places where you may be exposed to hepatitis A. Hepatitis B vaccine  You may need this if you have certain conditions or if you travel or work in places where you may be exposed to hepatitis B. Haemophilus influenzae type b (Hib) vaccine  You may need this if you have certain conditions. You may receive vaccines as individual doses or as more than one vaccine together in one shot (combination vaccines). Talk with your health care provider about the risks and benefits of combination vaccines. What tests do I need? Blood tests  Lipid and cholesterol levels. These may be checked every 5 years, or more frequently depending on your overall health.  Hepatitis C test.  Hepatitis B test. Screening  Lung cancer screening. You may have this screening every year starting at age 55 if you have a 30-pack-year history of smoking and currently smoke or have quit within the past 15 years.  Colorectal cancer screening. All adults should have this screening starting at age 50 and continuing until age 75. Your health care provider may recommend screening at age 45 if you are at increased risk. You will have tests every 1-10 years, depending on your results and the type of screening test.  Diabetes screening. This is done by checking your blood sugar (glucose) after you have not eaten for a while (fasting). You may have this done every 1-3   years.  Mammogram. This may be done every 1-2 years. Talk with your health care provider about how often you should have regular mammograms.  BRCA-related cancer screening. This may be done if you have a family history of breast, ovarian, tubal, or peritoneal cancers. Other tests  Sexually transmitted disease (STD) testing.  Bone density scan. This is done  to screen for osteoporosis. You may have this done starting at age 21. Follow these instructions at home: Eating and drinking  Eat a diet that includes fresh fruits and vegetables, whole grains, lean protein, and low-fat dairy products. Limit your intake of foods with high amounts of sugar, saturated fats, and salt.  Take vitamin and mineral supplements as recommended by your health care provider.  Do not drink alcohol if your health care provider tells you not to drink.  If you drink alcohol: ? Limit how much you have to 0-1 drink a day. ? Be aware of how much alcohol is in your drink. In the [place] one drink equals one 12 oz bottle of beer (355 mL), one 5 oz glass of wine (148 mL), or one 1 oz glass of hard liquor (44 mL). Lifestyle  Take daily care of your teeth and gums.  Stay active. Exercise for at least 30 minutes on 5 or more days each week.  Do not use any products that contain nicotine or tobacco, such as cigarettes, e-cigarettes, and chewing tobacco. If you need help quitting, ask your health care provider.  If you are sexually active, practice safe sex. Use a condom or other form of protection in order to prevent STIs (sexually transmitted infections).  Talk with your health care provider about taking a low-dose aspirin or statin. What's next?  Go to your health care provider once a year for a well check visit.  Ask your health care provider how often you should have your eyes and teeth checked.  Stay up to date on all vaccines. This information is not intended to replace advice given to you by your health care provider. Make sure you discuss any questions you have with your health care provider. Document Revised: 11/24/2018 Document Reviewed: 11/24/2018 Elsevier Patient Education  2020 [employer].

## 2020-03-22 ENCOUNTER — Ambulatory Visit: Payer: Self-pay | Admitting: *Deleted

## 2020-03-22 NOTE — Telephone Encounter (Signed)
Pt called stating she is having difficulty breathing/chest tightness that started 03/22/20; she says it is continuously; the pt states she took her inhaler 10 minutes prior to calling and it did help; the pt states she got the 2nd dose of COVID vaccine 3 weeks ago; she is coughing up a small amount of yellowish phlegm x 1 episode on 03/21/20; she takes zyrtec for seasonal allergies x 3 days; the pt says that she has no energy; her chest tightness is constant, but does not get worse with exertion; recommendations made per nurse triage protocol; she verbalized understanding; the pt sees Dr Pamella Pert, [place]; will route to office for notification of encounter. Reason for Disposition . [1] MODERATE difficulty breathing (e.g., speaks in phrases, SOB even at rest, pulse 100-120) AND [2] NEW-onset or WORSE than normal  Answer Assessment - Initial Assessment Questions 1. RESPIRATORY STATUS: "Describe your breathing?" (e.g., wheezing, shortness of breath, unable to speak, severe coughing)      Chest tightness 2. ONSET: "When did this breathing problem begin?"      03/22/20 3. PATTERN "Does the difficult breathing come and go, or has it been constant since it started?"    constant 4. SEVERITY: "How bad is your breathing?" (e.g., mild, moderate, severe)    - MILD: No SOB at rest, mild SOB with walking, speaks normally in sentences, can lay down, no retractions, pulse < 100.    - MODERATE: SOB at rest, SOB with minimal exertion and prefers to sit, cannot lie down flat, speaks in phrases, mild retractions, audible wheezing, pulse 100-120.    - SEVERE: Very SOB at rest, speaks in single words, struggling to breathe, sitting hunched forward, retractions, pulse > 120      moderate 5. RECURRENT SYMPTOM: "Have you had difficulty breathing before?" If so, ask: "When was the last time?" and "What happened that time?"     Yes, when asthma flairs up 6. CARDIAC HISTORY: "Do you have any history of heart disease?" (e.g., heart  attack, angina, bypass surgery, angioplasty)     htn 7. LUNG HISTORY: "Do you have any history of lung disease?"  (e.g., pulmonary embolus, asthma, emphysema)    Hx asthma 8. CAUSE: "What do you think is causing the breathing problem?"      asthma 9. OTHER SYMPTOMS: "Do you have any other symptoms? (e.g., dizziness, runny nose, cough, chest pain, fever)     "hot flashes" pt did not take her temp; productive cough x 1 episode 10. PREGNANCY: "Is there any chance you are pregnant?" "When was your last menstrual period?"        11. TRAVEL: "Have you traveled out of the country in the last month?" (e.g., travel history, exposures)  Protocols used: BREATHING DIFFICULTY-A-AH

## 2020-03-29 ENCOUNTER — Ambulatory Visit (INDEPENDENT_AMBULATORY_CARE_PROVIDER_SITE_OTHER)
Admission: RE | Admit: 2020-03-29 | Discharge: 2020-03-29 | Disposition: A | Discharge status: Home / Self Care | Payer: Medicare Other | Source: Ambulatory Visit

## 2020-03-29 DIAGNOSIS — S40862A Insect bite (nonvenomous) of left upper arm, initial encounter: Secondary | ICD-10-CM

## 2020-03-29 DIAGNOSIS — W57XXXA Bitten or stung by nonvenomous insect and other nonvenomous arthropods, initial encounter: Secondary | ICD-10-CM

## 2020-03-29 MED ORDER — ONDANSETRON 4 MG PO TBDP: 4.0000 mg | ORAL_TABLET | Freq: Three times a day (TID) | ORAL | 0 refills | Status: DC | PRN

## 2020-03-29 MED ORDER — DOXYCYCLINE HYCLATE 100 MG PO CAPS: 100.0000 mg | ORAL_CAPSULE | Freq: Two times a day (BID) | ORAL | 0 refills | Status: AC

## 2020-03-29 MED ORDER — TRIAMCINOLONE ACETONIDE 0.1 % EX CREA: 1.0000 "application " | TOPICAL_CREAM | Freq: Two times a day (BID) | CUTANEOUS | 0 refills | Status: DC

## 2020-03-29 NOTE — Discharge Instructions (Signed)
Please read attached on tick bites as well as symptoms to watch out for over the next month concerning for Lyme disease/[place]y mountain spotted fever Triamcinolone cream twice daily to area Doxycycline twice daily for 10 days- take with food, may use zofran as needed for nausea  Follow up in person if symptoms progressing/worsening

## 2020-03-29 NOTE — ED Provider Notes (Signed)
Virtual Visit via Video Note:  MEGGEN ANKENBAUER  initiated request for Telemedicine visit with [facility] team. I connected with [name]  on 03/29/2020 at 6:07 PM  for a synchronized telemedicine visit using a video enabled HIPPA compliant telemedicine application. I verified that I am speaking with [name]  using two identifiers. Florean Hoobler C Celestino Ackerman, PA-C  was physically located in a [facility]gent care site and TARAJI AHEDO was located at a different location.   The limitations of evaluation and management by telemedicine as well as the availability of in-person appointments were discussed. Patient was informed that she  may incur a bill ( including co-pay) for this virtual visit encounter. ANESHA WESTMAN  expressed understanding and gave verbal consent to proceed with virtual visit.     History of Present Illness:Paula Meyer  is a 69 y.o. female presents for evaluation of tick bite.  Patient notes that on Monday she found a tick to her left upper arm/shoulder area.  She reports the previous evening she was working out in her yard.  After removing the tick, she also noticed an area to her left neck that appeared similar.  She has had a small area of redness that is slightly raised with a central scabbing lesion.  Has associated itching, mild pain.  She checked her temperature today and was 99.6.  She has felt some aches in her knees and back, but reports that when she was working in the yard she was picking up some rocks and feels this might also be a cause of her arthralgias.  She denies headaches, nausea, vomiting, dizziness, lightheadedness.     Allergies  Allergen Reactions  . Paxil [Paroxetine Hcl]     Makes her grind her teeth  . Bupropion Hcl Other (See Comments)    palpitations  . Contrast Media [Iodinated Diagnostic Agents] Hives and Itching  . Lisinopril     cough  . Tetracycline Nausea Only     Past Medical History:  Diagnosis Date  .  Allergy   . Anxiety   . Asthma   . Depression   . Hx of colonic polyps   . Hyperlipidemia   . Hypertension   . Osteoarthritis      Social History   Tobacco Use  . Smoking status: Former Research scientist (life sciences)  . Smokeless tobacco: Never Used  Substance Use Topics  . Alcohol use: Yes    Alcohol/week: 3.0 standard drinks    Types: 3 Glasses of wine per week    Comment: rarely  . Drug use: No        Observations/Objective: Physical Exam  Constitutional: She is oriented to person, place, and time. No distress.  HENT:  Head: Normocephalic and atraumatic.  Pulmonary/Chest: Effort normal. No respiratory distress.  Speaking in full sentences  Musculoskeletal:     Cervical back: Normal range of motion.  Neurological: She is alert and oriented to person, place, and time. Gait normal.  Speech clear, face symmetric  Skin:  Left upper arm with small area of raised erythema with central hyperpigmentation/scabbing, no visible erythema extending outward; neck with similar lesion     Assessment and Plan:  Tick bite of axillary region, left, initial encounter   Likely tick bite, does have low-grade fevers, will go ahead and treat with doxycycline x10 days.  Has nausea with tetracyclines, recommend take with food and may use Zofran prior to taking.  Advised to call if having significant adverse  effects from doxycycline.  Lesions appear to have local inflammatory reaction, not suggestive of cellulitis at this time.  Triamcinolone topically to help with itching/inflammation in area.  Provided information on tick bites as well as signs and symptoms of Lyme/RMSF to monitor for over the next month.  Follow-up if developing the symptoms as may need to have doxycycline course extended.  Discussed strict return precautions. Patient verbalized understanding and is agreeable with plan.    Follow Up Instructions:     I discussed the assessment and treatment plan with the patient. The patient was provided  an opportunity to ask questions and all were answered. The patient agreed with the plan and demonstrated an understanding of the instructions.   The patient was advised to call back or seek an in-person evaluation if the symptoms worsen or if the condition fails to improve as anticipated.      [name], PA-C  03/29/2020 6:07 PM         [name], PA-C 03/29/20 2051

## 2020-05-11 ENCOUNTER — Other Ambulatory Visit: Payer: Self-pay | Admitting: Family Medicine

## 2020-05-11 NOTE — Telephone Encounter (Signed)
Requested Prescriptions  Pending Prescriptions Disp Refills  . telmisartan (MICARDIS) 40 MG tablet [Pharmacy Med Name: TELMISARTAN 40 MG TABLET] 90 tablet 1    Sig: TAKE 1 TABLET BY MOUTH EVERY DAY     Cardiovascular:  Angiotensin Receptor Blockers Failed - 05/11/2020  9:55 AM      Failed - Cr in normal range and within 180 days    Creatinine, Ser  Date Value Ref Range Status  10/10/2019 0.90 0.57 - 1.00 mg/dL Final         Failed - K in normal range and within 180 days    Potassium  Date Value Ref Range Status  10/10/2019 3.7 3.5 - 5.2 mmol/L Final         Passed - Patient is not pregnant      Passed - Last BP in normal range    BP Readings from Last 1 Encounters:  02/29/20 126/76         Passed - Valid encounter within last 6 months    Recent Outpatient Visits          2 months ago Medicare annual wellness visit, subsequent   [facility], Lilia Argue, MD   3 months ago Asthma due to seasonal allergies   [facility], Lilia Argue, MD   5 months ago Dysuria   [facility], Fenton Malling, MD   7 months ago Essential hypertension, benign   [facility], Lilia Argue, MD   10 months ago Trigger index finger of right hand   [facility], Lilia Argue, MD

## 2020-06-12 DIAGNOSIS — M5136 Other intervertebral disc degeneration, lumbar region: Secondary | ICD-10-CM | POA: Diagnosis not present

## 2020-06-12 DIAGNOSIS — M79641 Pain in right hand: Secondary | ICD-10-CM | POA: Diagnosis not present

## 2020-06-21 ENCOUNTER — Ambulatory Visit (INDEPENDENT_AMBULATORY_CARE_PROVIDER_SITE_OTHER): Payer: Medicare Other | Admitting: Family Medicine

## 2020-06-21 ENCOUNTER — Other Ambulatory Visit: Payer: Self-pay

## 2020-06-21 ENCOUNTER — Encounter: Payer: Self-pay | Admitting: Family Medicine

## 2020-06-21 ENCOUNTER — Ambulatory Visit: Payer: BC Managed Care – PPO | Admitting: Family Medicine

## 2020-06-21 VITALS — BP 130/78 | HR 88 | Temp 97.6°F | Ht 64.0 in | Wt 195.0 lb

## 2020-06-21 DIAGNOSIS — D518 Other vitamin B12 deficiency anemias: Secondary | ICD-10-CM | POA: Diagnosis not present

## 2020-06-21 DIAGNOSIS — R5383 Other fatigue: Secondary | ICD-10-CM

## 2020-06-21 DIAGNOSIS — F3289 Other specified depressive episodes: Secondary | ICD-10-CM | POA: Diagnosis not present

## 2020-06-21 DIAGNOSIS — W57XXXA Bitten or stung by nonvenomous insect and other nonvenomous arthropods, initial encounter: Secondary | ICD-10-CM

## 2020-06-21 DIAGNOSIS — E559 Vitamin D deficiency, unspecified: Secondary | ICD-10-CM

## 2020-06-21 DIAGNOSIS — E669 Obesity, unspecified: Secondary | ICD-10-CM

## 2020-06-21 DIAGNOSIS — I10 Essential (primary) hypertension: Secondary | ICD-10-CM

## 2020-06-21 DIAGNOSIS — E785 Hyperlipidemia, unspecified: Secondary | ICD-10-CM | POA: Diagnosis not present

## 2020-06-21 MED ORDER — DULOXETINE HCL 30 MG PO CPEP: 30.0000 mg | ORAL_CAPSULE | Freq: Every day | ORAL | 5 refills | Status: DC

## 2020-06-21 MED ORDER — AMLODIPINE BESYLATE 5 MG PO TABS: 5.0000 mg | ORAL_TABLET | Freq: Every day | ORAL | 1 refills | Status: DC

## 2020-06-21 NOTE — Progress Notes (Signed)
Patient ID: Paula Meyer, female    DOB: [birth year]  Age: 69 y.o. MRN: [id number]  Chief Complaint  Patient presents with  . Fatigue    Pt stated that she has been feeling fatigue, ache,pains and SOB since she had the COVID vacc    Subjective:   Patient has not been feeling well.  She has been fatigued for the last several months especially.  She did have some tick bites.  She has talked to an online provider who treated with antibiotics a couple times for that.  She did have a target-like rash with some of them.  She has been more depressed.  She thinks her blood pressure medicine is added to her fatigue, does not like the generic Micardis she has been taking and would like to be on something different.  She has not been taking his statin because she is scared of them.  She says the fatigue started about the time she got her second Covid shot and wonders whether that caused her legs.  She has put on a lot of weight.  She occasionally has to use her inhaler for wheezing but most of the time that is doing pretty well.  She has not been taking her antidepressant Cymbalta and would like to get back on it.  Current allergies, medications, problem list, past/family and social histories reviewed.  Objective:  BP 130/78 (BP Location: Right Arm, Patient Position: Sitting, Cuff Size: Normal)   Pulse 88   Temp 97.6 F (36.4 C) (Temporal)   Ht 5\' 4"  (1.626 m)   Wt 195 lb (88.5 kg)   LMP 04/02/2011   SpO2 96%   BMI 33.47 kg/m   No acute distress.  Pleasant lady alert and oriented.  Obese.  Throat clear.  Neck supple without nodes or thyromegaly.  Chest clear to auscultation.  Heart regular murmurs.  Abdomen soft without mass or tenderness.  No ankle edema.  Skin warm and dry.  A couple spots from old tick bites.  Assessment & Plan:   Assessment: 1. Vitamin D deficiency disease   2. Other vitamin B12 deficiency anemia   3. Fatigue, unspecified type   4. Hyperlipidemia with target LDL less than  130       Plan: Check her labs.  Change her blood pressure medicine.  Resume her antidepressant.  See Dr. Pamella Pert back in 3 months, sooner if problems arise.  See instructions.  No orders of the defined types were placed in this encounter.   No orders of the defined types were placed in this encounter.        Patient Instructions       If you have lab work done today you will be contacted with your lab results within the next 2 weeks.  If you have not heard from [place] then please contact us. The fastest way to get your results is to register for My Chart.   IF you received an x-ray today, you will receive an invoice from [facility]. Please contact [facility] at [phone] with questions or concerns regarding your invoice.   IF you received labwork today, you will receive an invoice from [place]. Please contact [facility] at [phone] with questions or concerns regarding your invoice.   Our billing staff will not be able to assist you with questions regarding bills from these companies.  You will be contacted with the lab results as soon as they are available. The fastest way to get your results is to activate  your My Chart account. Instructions are located on the last page of this paperwork. If you have not heard from [place] regarding the results in 2 weeks, please contact this office.        No follow-ups on file.   Ruben Reason, MD 06/21/2020

## 2020-06-21 NOTE — Patient Instructions (Addendum)
  We are initially doing an evaluation with a laboratory test to see if there is a good reason for your fatigue.  I am checking Lyme titers, general chemistries, hemoglobin and white blood count, and other tests on you.  Resume taking your Cymbalta her (duloxetine) 30 mg 1 daily  Continue taking the hydrochlorothiazide 25 mg daily  Discontinue the Micardis (telemarzan) blood pressure medicine  Begin amlodipine 5 mg 1 daily  If you have someone that can take your blood pressure, it would be good for you to get some readings every couple of weeks.  We will not start you back on the statin quite yet until we see what your labs are doing and how you are tolerating these other medicines.  Get regular exercise doing some daily walking.  Cut back when you are eating as we discussed.  Return in about 3 months to see Dr. Pamella Pert, sooner if problems arise.     If you have lab work done today you will be contacted with your lab results within the next 2 weeks.  If you have not heard from [place] then please contact us. The fastest way to get your results is to register for My Chart.   IF you received an x-ray today, you will receive an invoice from [facility]. Please contact [facility] at [phone] with questions or concerns regarding your invoice.   IF you received labwork today, you will receive an invoice from [place]. Please contact [facility] at [phone] with questions or concerns regarding your invoice.   Our billing staff will not be able to assist you with questions regarding bills from these companies.  You will be contacted with the lab results as soon as they are available. The fastest way to get your results is to activate your My Chart account. Instructions are located on the last page of this paperwork. If you have not heard from [place] regarding the results in 2 weeks, please contact this office.

## 2020-06-22 ENCOUNTER — Other Ambulatory Visit: Payer: Self-pay | Admitting: Family Medicine

## 2020-06-22 DIAGNOSIS — E785 Hyperlipidemia, unspecified: Secondary | ICD-10-CM

## 2020-06-22 LAB — COMPREHENSIVE METABOLIC PANEL
ALT: 19 IU/L (ref 0–32)
AST: 18 IU/L (ref 0–40)
Albumin/Globulin Ratio: 1.7 (ref 1.2–2.2)
Albumin: 4.3 g/dL (ref 3.8–4.8)
Alkaline Phosphatase: 67 IU/L (ref 48–121)
BUN/Creatinine Ratio: 22 (ref 12–28)
BUN: 21 mg/dL (ref 8–27)
Bilirubin Total: <0.2 mg/dL (ref 0.0–1.2)
CO2: 29 mmol/L (ref 20–29)
Calcium: 9.6 mg/dL (ref 8.7–10.3)
Chloride: 99 mmol/L (ref 96–106)
Creatinine, Ser: 0.95 mg/dL (ref 0.57–1.00)
GFR calc Af Amer: 72 mL/min/{1.73_m2} (ref >59)
GFR calc non Af Amer: 63 mL/min/{1.73_m2} (ref >59)
Globulin, Total: 2.5 g/dL (ref 1.5–4.5)
Glucose: 83 mg/dL (ref 65–99)
Potassium: 3.8 mmol/L (ref 3.5–5.2)
Sodium: 139 mmol/L (ref 134–144)
Total Protein: 6.8 g/dL (ref 6.0–8.5)

## 2020-06-22 LAB — VITAMIN D 25 HYDROXY (VIT D DEFICIENCY, FRACTURES): Vit D, 25-Hydroxy: 35.9 ng/mL (ref 30.0–100.0)

## 2020-06-22 LAB — CBC
Hematocrit: 35.7 % (ref 34.0–46.6)
Hemoglobin: 11.5 g/dL (ref 11.1–15.9)
MCH: 27.4 pg (ref 26.6–33.0)
MCHC: 32.2 g/dL (ref 31.5–35.7)
MCV: 85 fL (ref 79–97)
Platelets: 220 10*3/uL (ref 150–450)
RBC: 4.2 x10E6/uL (ref 3.77–5.28)
RDW: 13.1 % (ref 11.7–15.4)
WBC: 3.1 10*3/uL — ABNORMAL LOW (ref 3.4–10.8)

## 2020-06-22 LAB — LIPID PANEL
Chol/HDL Ratio: 5.5 ratio — ABNORMAL HIGH (ref 0.0–4.4)
Cholesterol, Total: 323 mg/dL — ABNORMAL HIGH (ref 100–199)
HDL: 59 mg/dL (ref >39)
LDL Chol Calc (NIH): 237 mg/dL — ABNORMAL HIGH (ref 0–99)
Triglycerides: 146 mg/dL (ref 0–149)
VLDL Cholesterol Cal: 27 mg/dL (ref 5–40)

## 2020-06-22 LAB — TSH: TSH: 3.04 u[IU]/mL (ref 0.450–4.500)

## 2020-06-22 LAB — VITAMIN B12: Vitamin B-12: 693 pg/mL (ref 232–1245)

## 2020-06-22 MED ORDER — ROSUVASTATIN CALCIUM 40 MG PO TABS: 40.0000 mg | ORAL_TABLET | Freq: Every day | ORAL | 1 refills | Status: DC

## 2020-06-25 ENCOUNTER — Encounter: Payer: Self-pay | Admitting: Family Medicine

## 2020-06-25 LAB — B. BURGDORFI ANTIBODIES: Lyme IgG/IgM Ab: <0.91 {ISR} (ref 0.00–0.90)

## 2020-06-27 NOTE — Progress Notes (Signed)
Lyme testing was negativeDavid [employer]

## 2020-07-15 ENCOUNTER — Encounter: Payer: Self-pay | Admitting: Family Medicine

## 2020-07-15 LAB — HM PAP SMEAR

## 2020-07-19 ENCOUNTER — Telehealth: Payer: Self-pay | Admitting: Family Medicine

## 2020-07-19 NOTE — Telephone Encounter (Signed)
Pt is calling to report she has tried  The telmisartan (MICARDIS) 40 MG tablet  And then amLODipine (NORVASC) 5 MG tablet   Neither medicines are working she has tried lisinopril and that does not work .   She is wanting something else called in   All other medicines working fine    Please advise

## 2020-07-22 NOTE — Telephone Encounter (Signed)
Pls schedule pt for a tele health to discuss bp medications with [place]

## 2020-07-22 NOTE — Telephone Encounter (Signed)
Called pt and sch Mychart visit on 07/25/20.

## 2020-07-25 ENCOUNTER — Other Ambulatory Visit: Payer: Self-pay

## 2020-07-25 ENCOUNTER — Telehealth (INDEPENDENT_AMBULATORY_CARE_PROVIDER_SITE_OTHER): Payer: Medicare Other | Admitting: Family Medicine

## 2020-07-25 ENCOUNTER — Encounter: Payer: Self-pay | Admitting: Family Medicine

## 2020-07-25 VITALS — BP 142/86 | HR 56

## 2020-07-25 DIAGNOSIS — I10 Essential (primary) hypertension: Secondary | ICD-10-CM | POA: Diagnosis not present

## 2020-07-25 MED ORDER — HYDROCHLOROTHIAZIDE 25 MG PO TABS: 25.0000 mg | ORAL_TABLET | Freq: Every day | ORAL | 1 refills | Status: DC

## 2020-07-25 MED ORDER — LOSARTAN POTASSIUM 50 MG PO TABS
50.0000 mg | ORAL_TABLET | Freq: Every day | ORAL | 1 refills | Status: DC
Start: 2020-07-25 — End: 2021-01-22

## 2020-07-25 NOTE — Patient Instructions (Signed)
° ° ° °  If you have lab work done today you will be contacted with your lab results within the next 2 weeks.  If you have not heard from us then please contact us. The fastest way to get your results is to register for My Chart. ° ° °IF you received an x-ray today, you will receive an invoice from [facility]. Please contact [facility] at [phone] with questions or concerns regarding your invoice.  ° °IF you received labwork today, you will receive an invoice from [facility]. Please contact [facility] at [phone] with questions or concerns regarding your invoice.  ° °Our billing staff will not be able to assist you with questions regarding bills from these companies. ° °You will be contacted with the lab results as soon as they are available. The fastest way to get your results is to activate your My Chart account. Instructions are located on the last page of this paperwork. If you have not heard from us regarding the results in 2 weeks, please contact this office. °  ° ° ° °

## 2020-07-25 NOTE — Progress Notes (Signed)
Virtual Visit Note  I connected with patient on 07/25/20 at 601pm by phone and verified that I am speaking with the correct person using two identifiers. Paula Meyer is currently located at home and patient is currently with them during visit. The provider, Rutherford Guys, MD is located in their office at time of visit.  I discussed the limitations, risks, security and privacy concerns of performing an evaluation and management service by telephone and the availability of in person appointments. I also discussed with the patient that there may be a patient responsible charge related to this service. The patient expressed understanding and agreed to proceed.   I provided 9 minutes of non-face-to-face time during this encounter.  Chief Complaint  Patient presents with  . Hypertension    amlodipine causes swelling taking eod     HPI ? Saw Dr Linna Darner a month ago - stopped telemarzan and started amlodipine 5mg  daily, cont HCTZ 25mg , changes made as she thought generic micardis was causing her to be tired  She does not like amlodipine as it has been causing her ankles to swell, she has been still taking every other day She reports that fatigue resolved once she stopped taking micardis She reports that BP was ok during these episodes Tolerating rosuvastatin well  Reports that depression and body aches are well controlled on cymbalta  Lab Results  Component Value Date   CREATININE 0.95 06/21/2020   BUN 21 06/21/2020   NA 139 06/21/2020   K 3.8 06/21/2020   CL 99 06/21/2020   CO2 29 06/21/2020   BP Readings from Last 3 Encounters:  07/25/20 (!) 142/86  06/21/20 130/78  02/29/20 126/76    Allergies  Allergen Reactions  . Paxil [Paroxetine Hcl]     Makes her grind her teeth  . Bupropion Hcl Other (See Comments)    palpitations  . Contrast Media [Iodinated Diagnostic Agents] Hives and Itching  . Lisinopril     cough  . Tetracycline Nausea Only    Prior to Admission  medications   Medication Sig Start Date End Date Taking? Authorizing Provider  albuterol (VENTOLIN HFA) 108 (90 Base) MCG/ACT inhaler INHALE 2 PUFFS INTO THE LUNGS EVERY 4 HOURS AS NEEDED FOR COUGH, WHEEZING OR SHORTNESS OF BREATH 07/12/19  Yes Rutherford Guys, MD  amLODipine (NORVASC) 5 MG tablet Take 1 tablet (5 mg total) by mouth daily. 06/21/20  Yes Posey Boyer, MD  aspirin EC 81 MG tablet Take 1 tablet (81 mg total) by mouth daily. 08/24/18  Yes [name], MD  Azelastine HCl 0.15 % SOLN USE 1 SPRAY IN THE NOSE TWICE DAILY 09/28/18  Yes [name], MD  Azelastine-Fluticasone 137-50 MCG/ACT SUSP Place 1 spray into the nose 2 (two) times daily as needed. 01/23/20  Yes Bobbitt, Sedalia Muta, MD  diclofenac sodium (VOLTAREN) 1 % GEL Apply 2 g topically 4 (four) times daily. 10/10/19  Yes Rutherford Guys, MD  DULoxetine (CYMBALTA) 30 MG capsule Take 1 capsule (30 mg total) by mouth daily. 06/21/20  Yes Posey Boyer, MD  fexofenadine (ALLEGRA) 180 MG tablet Take 1 tablet (180 mg total) by mouth daily as needed for allergies or rhinitis. 01/23/20  Yes Bobbitt, Sedalia Muta, MD  fluticasone (FLOVENT HFA) 110 MCG/ACT inhaler Inhale 2 puffs into the lungs 2 (two) times daily. 01/23/20  Yes Bobbitt, Sedalia Muta, MD  hydrochlorothiazide (HYDRODIURIL) 25 MG tablet TAKE 1 TABLET BY MOUTH EVERY DAY 11/28/19  Yes Grant Fontana  M, MD  MIMVEY 1-0.5 MG tablet Take 1 tablet by mouth daily. prn 06/17/16  Yes [provider]  nystatin (MYCOSTATIN/NYSTOP) powder APPLY TO AFFECTED AREA TWICE A DAY 09/01/19  Yes [provider]  Olopatadine HCl (PATADAY) 0.2 % SOLN Place 1 drop into both eyes daily as needed. 01/23/20  Yes Bobbitt, Sedalia Muta, MD  ondansetron (ZOFRAN ODT) 4 MG disintegrating tablet Take 1 tablet (4 mg total) by mouth every 8 (eight) hours as needed for nausea or vomiting. 03/29/20  Yes Wieters, Hallie C, PA-C  rosuvastatin (CRESTOR) 40 MG tablet Take 1 tablet (40 mg total) by  mouth daily. 06/22/20  Yes Rutherford Guys, MD  triamcinolone cream (KENALOG) 0.1 % Apply 1 application topically 2 (two) times daily. 03/29/20  Yes Wieters, Hallie C, PA-C  Turmeric 500 MG CAPS Take 1 capsule by mouth daily. 08/23/18  Yes [name], MD  telmisartan (MICARDIS) 40 MG tablet TAKE 1 TABLET BY MOUTH EVERY DAY Patient not taking: Reported on 07/25/2020 05/11/20   Rutherford Guys, MD    Past Medical History:  Diagnosis Date  . Allergy   . Anxiety   . Asthma   . Depression   . Hx of colonic polyps   . Hyperlipidemia   . Hypertension   . Osteoarthritis     Past Surgical History:  Procedure Laterality Date  . TUBAL LIGATION    . WISDOM TOOTH EXTRACTION      Social History   Tobacco Use  . Smoking status: Former Research scientist (life sciences)  . Smokeless tobacco: Never Used  Substance Use Topics  . Alcohol use: Yes    Alcohol/week: 3.0 standard drinks    Types: 3 Glasses of wine per week    Comment: rarely    Family History  Problem Relation Age of Onset  . Hyperlipidemia Brother   . Hypertension Brother   . Arthritis Other   . Depression Other   . Colon cancer Neg Hx   . Stomach cancer Neg Hx     ROS Per hpi  Objective  Vitals as reported by the patient: per above  AAX3, NAD Speaking in full sentences wo difficulty   ASSESSMENT and PLAN  1. Essential hypertension, benign Having side effects on amlodipine. Therefore will dc amlodipine, trial of different arb - losartan 50mg  daily, cont hctz 25mg  daily. Cont home bp monitoring. Fasting labs in 2 weeks.  - hydrochlorothiazide (HYDRODIURIL) 25 MG tablet; Take 1 tablet (25 mg total) by mouth daily.  Other orders - losartan (COZAAR) 50 MG tablet; Take 1 tablet (50 mg total) by mouth daily.  FOLLOW-UP: 3 months   The above assessment and management plan was discussed with the patient. The patient verbalized understanding of and has agreed to the management plan. Patient is aware to call the clinic if symptoms  persist or worsen. Patient is aware when to return to the clinic for a follow-up visit. Patient educated on when it is appropriate to go to the emergency department.     Rutherford Guys, MD Primary Care at [facility] [place], [place] [address] Ph.  [phone] Fax [phone]

## 2020-08-14 ENCOUNTER — Other Ambulatory Visit: Payer: Self-pay

## 2020-08-14 ENCOUNTER — Ambulatory Visit (INDEPENDENT_AMBULATORY_CARE_PROVIDER_SITE_OTHER): Payer: BC Managed Care – PPO | Admitting: Emergency Medicine

## 2020-08-14 DIAGNOSIS — E785 Hyperlipidemia, unspecified: Secondary | ICD-10-CM | POA: Diagnosis not present

## 2020-08-15 LAB — LIPID PANEL
Chol/HDL Ratio: 2.9 ratio (ref 0.0–4.4)
Cholesterol, Total: 167 mg/dL (ref 100–199)
HDL: 57 mg/dL (ref >39)
LDL Chol Calc (NIH): 99 mg/dL (ref 0–99)
Triglycerides: 54 mg/dL (ref 0–149)
VLDL Cholesterol Cal: 11 mg/dL (ref 5–40)

## 2020-08-15 LAB — COMPREHENSIVE METABOLIC PANEL
ALT: 24 IU/L (ref 0–32)
AST: 21 IU/L (ref 0–40)
Albumin/Globulin Ratio: 1.5 (ref 1.2–2.2)
Albumin: 4.1 g/dL (ref 3.8–4.8)
Alkaline Phosphatase: 61 IU/L (ref 48–121)
BUN/Creatinine Ratio: 13 (ref 12–28)
BUN: 12 mg/dL (ref 8–27)
Bilirubin Total: 0.3 mg/dL (ref 0.0–1.2)
CO2: 27 mmol/L (ref 20–29)
Calcium: 9.6 mg/dL (ref 8.7–10.3)
Chloride: 105 mmol/L (ref 96–106)
Creatinine, Ser: 0.91 mg/dL (ref 0.57–1.00)
GFR calc Af Amer: 76 mL/min/{1.73_m2} (ref >59)
GFR calc non Af Amer: 65 mL/min/{1.73_m2} (ref >59)
Globulin, Total: 2.7 g/dL (ref 1.5–4.5)
Glucose: 98 mg/dL (ref 65–99)
Potassium: 4.1 mmol/L (ref 3.5–5.2)
Sodium: 143 mmol/L (ref 134–144)
Total Protein: 6.8 g/dL (ref 6.0–8.5)

## 2020-09-24 DIAGNOSIS — Z1231 Encounter for screening mammogram for malignant neoplasm of breast: Secondary | ICD-10-CM | POA: Diagnosis not present

## 2020-09-24 LAB — HM MAMMOGRAPHY

## 2020-11-22 DIAGNOSIS — R600 Localized edema: Secondary | ICD-10-CM | POA: Diagnosis not present

## 2020-11-22 DIAGNOSIS — M47816 Spondylosis without myelopathy or radiculopathy, lumbar region: Secondary | ICD-10-CM | POA: Diagnosis not present

## 2020-11-22 DIAGNOSIS — M5136 Other intervertebral disc degeneration, lumbar region: Secondary | ICD-10-CM | POA: Diagnosis not present

## 2020-11-22 DIAGNOSIS — R52 Pain, unspecified: Secondary | ICD-10-CM | POA: Diagnosis not present

## 2020-11-28 ENCOUNTER — Other Ambulatory Visit: Payer: Self-pay

## 2020-11-28 ENCOUNTER — Encounter: Payer: Self-pay | Admitting: Family Medicine

## 2020-11-28 ENCOUNTER — Ambulatory Visit (INDEPENDENT_AMBULATORY_CARE_PROVIDER_SITE_OTHER): Payer: Medicare Other | Admitting: Family Medicine

## 2020-11-28 ENCOUNTER — Telehealth: Payer: Self-pay

## 2020-11-28 VITALS — BP 145/75 | HR 91 | Temp 98.7°F | Ht 64.0 in | Wt 202.4 lb

## 2020-11-28 DIAGNOSIS — E876 Hypokalemia: Secondary | ICD-10-CM

## 2020-11-28 DIAGNOSIS — M79661 Pain in right lower leg: Secondary | ICD-10-CM

## 2020-11-28 MED ORDER — POTASSIUM CHLORIDE ER 10 MEQ PO TBCR: 10.0000 meq | EXTENDED_RELEASE_TABLET | Freq: Two times a day (BID) | ORAL | 0 refills | Status: DC

## 2020-11-28 NOTE — Telephone Encounter (Signed)
I Norton Blizzard called patient and let her know I have her scheduled to have her US done at [facility] 11/29/20 @9 :00 but be there at 8:45 check in time. Patient understood but was driving and wanted me to call back and leave this detail msg on her vm as well. Voice mail was left on machine. Ashley Jacobs

## 2020-11-28 NOTE — Patient Instructions (Addendum)
    I have ordered you a venous ultrasound study of the right calf.  Someone should contact you over the next 24 hours as to when that can be done.  I will let you know the results when I get them.  If your leg gets worse, please seek additional help.  Either here or at an urgent care or emergency room.  If the venous study is normal, then I would like you to start taking Aleve 2 pills twice daily for a couple of weeks.  If the pain continues to persist come back for recheck.  Take with food.   Your potassium level was a little bit low, I am not sure why.  I am replacing the potassium by having you take 1 twice daily for couple weeks.  Next time you see a doctor asked to get your potassium level rechecked 1 more time, but it is always been good in the past so I will do not think it is a major concern.  Return as needed.   If you have lab work done today you will be contacted with your lab results within the next 2 weeks.  If you have not heard from [place] then please contact us. The fastest way to get your results is to register for My Chart.   IF you received an x-ray today, you will receive an invoice from [facility]. Please contact [facility] at [phone] with questions or concerns regarding your invoice.   IF you received labwork today, you will receive an invoice from [place]. Please contact [facility] at [phone] with questions or concerns regarding your invoice.   Our billing staff will not be able to assist you with questions regarding bills from these companies.  You will be contacted with the lab results as soon as they are available. The fastest way to get your results is to activate your My Chart account. Instructions are located on the last page of this paperwork. If you have not heard from [place] regarding the results in 2 weeks, please contact this office.

## 2020-11-28 NOTE — Progress Notes (Signed)
Patient ID: Paula Meyer, female    DOB: 04/08/1951  Age: 69 y.o. MRN: [id number]  Chief Complaint  Patient presents with  . low potassium   . right leg pain     And both calf's gets tight.     Subjective:  Patient is a retired Pharmacist, hospital.  She has been having pain in the right calf for the past week.  She does most of her stuff at home.  She is working on a PhD.  Her computer is in her basement and she has to go up 1 floor back-and-forth to her printer.  That is the only thing she can imagine that would have strained her right leg.  It has been hurting in the right calf over the past week.  It started medially near the knee, migrated more posterior laterally.  Hurts mostly in the popliteal fossa area.  No history of prior knee issues or problems or injuries.  [facility] and they did blood work on her and called her to tell her that her potassium was low and she needed to come in here.  However they really did not address what to do about her leg.  Current allergies, medications, problem list, past/family and social histories reviewed.  Objective:  BP (!) 145/75   Pulse 91   Temp 98.7 F (37.1 C) (Temporal)   Ht 5\' 4"  (1.626 m)   Wt 202 lb 6.4 oz (91.8 kg)   LMP 04/02/2011   SpO2 98%   BMI 34.74 kg/m   Alert and oriented.  1+ edema of the right leg.  Tenderness in the popliteal fossa and more in the levels mid and lateral calf area.  No tenderness medially or medial thigh.  Assessment & Plan:   Assessment: 1. Right calf pain   2. Hypokalemia       Plan: This been going on a week.  I think we need to make sure she does not have a clot.  She possibly could have a Baker's cyst.  Labs from last week on Ellinore Merced done through [facility] diagnostics showed BUN 15, creatinine 0.92, glucose 84, sodium 140, potassium 3.3, chloride 101, CO2 31, calcium 9.6.  The potassium is the only item of concern. No orders of the defined types were placed in  this encounter.   No orders of the defined types were placed in this encounter.        Patient Instructions       If you have lab work done today you will be contacted with your lab results within the next 2 weeks.  If you have not heard from [place] then please contact us. The fastest way to get your results is to register for My Chart.   IF you received an x-ray today, you will receive an invoice from [facility]. Please contact [facility] at [phone] with questions or concerns regarding your invoice.   IF you received labwork today, you will receive an invoice from [place]. Please contact [facility] at [phone] with questions or concerns regarding your invoice.   Our billing staff will not be able to assist you with questions regarding bills from these companies.  You will be contacted with the lab results as soon as they are available. The fastest way to get your results is to activate your My Chart account. Instructions are located on the last page of this paperwork. If you have not heard from [place] regarding the results in 2  weeks, please contact this office.         No follow-ups on file.   Ruben Reason, MD 11/28/2020

## 2020-11-29 ENCOUNTER — Ambulatory Visit (HOSPITAL_COMMUNITY)
Admission: RE | Admit: 2020-11-29 | Discharge: 2020-11-29 | Disposition: A | Discharge status: Home / Self Care | Payer: Medicare Other | Source: Ambulatory Visit | Attending: Family Medicine | Admitting: Family Medicine

## 2020-11-29 DIAGNOSIS — M79661 Pain in right lower leg: Secondary | ICD-10-CM

## 2020-11-29 NOTE — Progress Notes (Signed)
Lower extremity venous has been completed.   Preliminary results in CV Proc.   Abram Sander 11/29/2020 8:53 AM

## 2020-12-06 ENCOUNTER — Other Ambulatory Visit: Payer: Self-pay | Admitting: Family Medicine

## 2020-12-06 DIAGNOSIS — E876 Hypokalemia: Secondary | ICD-10-CM

## 2020-12-08 NOTE — Telephone Encounter (Signed)
Your potassium level was a little bit low, I am not sure why.  I am replacing the potassium by having you take 1 twice daily for couple weeks.  Next time you see a doctor asked to get your potassium level rechecked 1 more time, but it is always been good in the past so I will do not think it is a major concern.  Return as needed.  1st RF 11/28/20 #30 tablets-  Refused request and routed refusal to office

## 2020-12-12 DIAGNOSIS — M25561 Pain in right knee: Secondary | ICD-10-CM | POA: Diagnosis not present

## 2020-12-12 DIAGNOSIS — M79604 Pain in right leg: Secondary | ICD-10-CM | POA: Diagnosis not present

## 2020-12-14 DIAGNOSIS — I700 Atherosclerosis of aorta: Secondary | ICD-10-CM

## 2020-12-14 HISTORY — DX: Atherosclerosis of aorta: I70.0

## 2020-12-17 ENCOUNTER — Ambulatory Visit: Payer: BC Managed Care – PPO | Admitting: Family Medicine

## 2020-12-19 DIAGNOSIS — M47816 Spondylosis without myelopathy or radiculopathy, lumbar region: Secondary | ICD-10-CM | POA: Diagnosis not present

## 2021-01-03 DIAGNOSIS — E785 Hyperlipidemia, unspecified: Secondary | ICD-10-CM | POA: Diagnosis not present

## 2021-01-03 DIAGNOSIS — M533 Sacrococcygeal disorders, not elsewhere classified: Secondary | ICD-10-CM | POA: Diagnosis not present

## 2021-01-14 DIAGNOSIS — Z79899 Other long term (current) drug therapy: Secondary | ICD-10-CM | POA: Diagnosis not present

## 2021-01-14 DIAGNOSIS — M533 Sacrococcygeal disorders, not elsewhere classified: Secondary | ICD-10-CM | POA: Diagnosis not present

## 2021-01-22 ENCOUNTER — Ambulatory Visit (INDEPENDENT_AMBULATORY_CARE_PROVIDER_SITE_OTHER): Payer: Medicare Other | Admitting: Family Medicine

## 2021-01-22 ENCOUNTER — Encounter: Payer: Self-pay | Admitting: Family Medicine

## 2021-01-22 ENCOUNTER — Other Ambulatory Visit: Payer: Self-pay

## 2021-01-22 VITALS — BP 140/77 | HR 73 | Temp 98.2°F | Ht 64.0 in | Wt 213.0 lb

## 2021-01-22 DIAGNOSIS — R600 Localized edema: Secondary | ICD-10-CM

## 2021-01-22 DIAGNOSIS — F338 Other recurrent depressive disorders: Secondary | ICD-10-CM

## 2021-01-22 DIAGNOSIS — I739 Peripheral vascular disease, unspecified: Secondary | ICD-10-CM

## 2021-01-22 DIAGNOSIS — I10 Essential (primary) hypertension: Secondary | ICD-10-CM | POA: Diagnosis not present

## 2021-01-22 DIAGNOSIS — E785 Hyperlipidemia, unspecified: Secondary | ICD-10-CM

## 2021-01-22 DIAGNOSIS — M7989 Other specified soft tissue disorders: Secondary | ICD-10-CM

## 2021-01-22 MED ORDER — ROSUVASTATIN CALCIUM 40 MG PO TABS: 40.0000 mg | ORAL_TABLET | Freq: Every day | ORAL | 1 refills | Status: DC

## 2021-01-22 MED ORDER — LOSARTAN POTASSIUM 50 MG PO TABS: 50.0000 mg | ORAL_TABLET | Freq: Every day | ORAL | 1 refills | Status: DC

## 2021-01-22 MED ORDER — HYDROCHLOROTHIAZIDE 25 MG PO TABS: 25.0000 mg | ORAL_TABLET | Freq: Every day | ORAL | 1 refills | Status: DC

## 2021-01-22 NOTE — Progress Notes (Signed)
2/9/20224:19 PM  Paula Meyer 05/25/1951, 70 y.o., female [id number]  Chief Complaint  Patient presents with  . Leg Swelling    3 weeks Makes walking difficult , had recent ortho appts for back pain, mostly sedentary - goes to school online  . Transitions Of Care    HPI:   Patient is a 70 y.o. female with past medical history significant for HTN, GERD, HLD, GAD who presents today for leg swelling.   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  The potassium is the only item of concern.  Has had issues with weight gain over the past few months Knows she needs to walk more Wt Readings from Last 3 Encounters:  01/22/21 213 lb (96.6 kg)  11/28/20 202 lb 6.4 oz (91.8 kg)  06/21/20 195 lb (88.5 kg)   Lab Results  Component Value Date   CHOL 167 08/14/2020   HDL 57 08/14/2020   LDLCALC 99 08/14/2020   LDLDIRECT 188.6 07/10/2013   TRIG 54 08/14/2020   CHOLHDL 2.9 08/14/2020      Depression screen PHQ 2/9 01/22/2021 11/28/2020 07/25/2020  Decreased Interest 0 0 0  Down, Depressed, Hopeless 0 0 0  PHQ - 2 Score 0 0 0  Altered sleeping - - -  Tired, decreased energy - - -  Change in appetite - - -  Feeling bad or failure about yourself  - - -  Trouble concentrating - - -  Moving slowly or fidgety/restless - - -  Suicidal thoughts - - -  PHQ-9 Score - - -  Difficult doing work/chores - - -    Fall Risk  01/22/2021 11/28/2020 07/25/2020 06/21/2020 02/29/2020  Falls in the past year? 0 0 0 0 0   Number falls in past yr: 0 0 0 0 0  Injury with Fall? 0 0 0 0 0  Follow up Falls evaluation completed Falls evaluation completed Falls evaluation completed Falls evaluation completed Falls evaluation completed;Education provided     Allergies  Allergen Reactions  . Paxil [Paroxetine Hcl]     Makes her grind her teeth  . Bupropion Hcl Other (See Comments)    palpitations  . Contrast Media [Iodinated Diagnostic Agents] Hives and Itching  . Lisinopril     cough  . Tetracycline Nausea Only    Prior to Admission medications   Medication Sig Start Date End Date Taking? Authorizing Provider  albuterol (VENTOLIN HFA) 108 (90 Base) MCG/ACT inhaler INHALE 2 PUFFS INTO THE LUNGS EVERY 4 HOURS AS NEEDED FOR COUGH, WHEEZING OR SHORTNESS OF BREATH 07/12/19  Yes Jacelyn Pi, Lilia Argue, MD  aspirin EC 81 MG tablet Take 1 tablet (81 mg total) by mouth daily. 08/24/18  Yes [name], MD  diclofenac sodium (VOLTAREN) 1 % GEL Apply 2 g topically 4 (four) times daily. 10/10/19  Yes Jacelyn Pi, Irma M, MD  fluticasone (FLOVENT HFA) 110 MCG/ACT inhaler Inhale 2 puffs into the lungs 2 (two) times daily. 01/23/20  Yes Bobbitt, Sedalia Muta, MD  hydrochlorothiazide (HYDRODIURIL) 25 MG tablet Take 1 tablet (25 mg total) by mouth daily. 07/25/20  Yes Jacelyn Pi, Lilia Argue, MD  losartan (COZAAR) 50 MG tablet Take 1 tablet (50 mg total) by mouth daily. 07/25/20  Yes Jacelyn Pi, Lilia Argue, MD  MIMVEY 1-0.5 MG tablet Take 1 tablet by mouth daily. prn 06/17/16  Yes [provider]  potassium chloride (KLOR-CON) 10 MEQ tablet Take 1 tablet (10 mEq total) by mouth 2 (two) times daily. 11/28/20  Yes Posey Boyer, MD  triamcinolone cream (KENALOG) 0.1 % Apply 1 application topically 2 (two) times daily. 03/29/20  Yes Wieters, Hallie C, PA-C  Azelastine HCl 0.15 % SOLN USE 1 SPRAY IN THE NOSE TWICE DAILY Patient not taking: Reported on 01/22/2021 09/28/18   [name], MD  Azelastine-Fluticasone 137-50  MCG/ACT SUSP Place 1 spray into the nose 2 (two) times daily as needed. Patient not taking: Reported on 01/22/2021 01/23/20   Bobbitt, Sedalia Muta, MD  DULoxetine (CYMBALTA) 30 MG capsule Take 1 capsule (30 mg total) by mouth daily. Patient not taking: No sig reported 06/21/20   Posey Boyer, MD  Olopatadine HCl (PATADAY) 0.2 % SOLN Place 1 drop into both eyes daily as needed. Patient not taking: Reported on 01/22/2021 01/23/20   Bobbitt, Sedalia Muta, MD    Past Medical History:  Diagnosis Date  . Allergy   . Anxiety   . Asthma   . Depression   . Hx of colonic polyps   . Hyperlipidemia   . Hypertension   . Osteoarthritis     Past Surgical History:  Procedure Laterality Date  . TUBAL LIGATION    . WISDOM TOOTH EXTRACTION      Social History   Tobacco Use  . Smoking status: Former Research scientist (life sciences)  . Smokeless tobacco: Never Used  Substance Use Topics  . Alcohol use: Yes    Alcohol/week: 3.0 standard drinks    Types: 3 Glasses of wine per week    Comment: rarely    Family History  Problem Relation Age of Onset  . Hyperlipidemia Brother   . Hypertension Brother   . Arthritis Other   . Depression Other   . Colon cancer Neg Hx   . Stomach cancer Neg Hx     Review of Systems  Constitutional: Negative for chills, fever and malaise/fatigue.  Eyes: Negative for blurred vision and double vision.  Respiratory: Negative for cough, shortness of breath and wheezing.   Cardiovascular: Positive for leg swelling. Negative for chest pain and palpitations.  Gastrointestinal: Negative for abdominal pain, blood in stool, constipation, diarrhea, heartburn, nausea and vomiting.  Genitourinary: Negative for dysuria, frequency and hematuria.  Musculoskeletal: Negative for back pain and joint pain.  Skin: Negative for rash.  Neurological: Negative for dizziness, tingling, sensory change, focal weakness, weakness and headaches.  Psychiatric/Behavioral: Negative for depression.      OBJECTIVE:  Today's Vitals   01/22/21 1432  BP: 140/77  Pulse: 73  Temp: 98.2 F (36.8 C)  SpO2: 95%  Weight: 213 lb (96.6 kg)  Height: 5' 4"  (1.626 m)   Body mass index is 36.56 kg/m.   Physical Exam Constitutional:      General: She is not in acute distress.    Appearance: Normal appearance. She is not ill-appearing.  HENT:     Head: Normocephalic.  Cardiovascular:     Rate and Rhythm: Normal rate and regular rhythm.     Pulses: Normal pulses.     Heart sounds:  Normal heart sounds. No murmur heard. No friction rub. No gallop.   Pulmonary:     Effort: Pulmonary effort is normal. No respiratory distress.     Breath sounds: Normal breath sounds. No stridor. No wheezing, rhonchi or rales.  Abdominal:     General: Bowel sounds are normal.     Palpations: Abdomen is soft.     Tenderness: There is no abdominal tenderness.  Musculoskeletal:     Right lower leg: No edema.     Left lower leg: No edema.  Skin:    General: Skin is warm and dry.  Neurological:     Mental Status: She is alert and oriented to person, place, and time.  Psychiatric:        Mood and Affect: Mood normal.        Behavior: Behavior normal.     No results found for this or any previous visit (from the past 24 hour(s)).  No results found.   ASSESSMENT and PLAN  Problem List Items Addressed This Visit      Cardiovascular and Mediastinum   Essential hypertension, benign   Relevant Medications   rosuvastatin (CRESTOR) 40 MG tablet   hydrochlorothiazide (HYDRODIURIL) 25 MG tablet   losartan (COZAAR) 50 MG tablet     Other   Hyperlipidemia with target LDL less than 130 - Primary   Relevant Medications   rosuvastatin (CRESTOR) 40 MG tablet   hydrochlorothiazide (HYDRODIURIL) 25 MG tablet   losartan (COZAAR) 50 MG tablet   Seasonal affective disorder (HCC)   Morbid obesity (HCC)    Other Visit Diagnoses    Leg swelling       Relevant Orders   Brain natriuretic peptide    CMP14+EGFR   PVD (peripheral vascular disease) (HCC)       Relevant Medications   rosuvastatin (CRESTOR) 40 MG tablet   hydrochlorothiazide (HYDRODIURIL) 25 MG tablet   losartan (COZAAR) 50 MG tablet   Localized edema        Relevant Orders   Brain natriuretic peptide      Plan . Will restart crestor . Start wearing compression socks daily . Refills sent . Will follow up for lab results . School note provided . Will continue to monitor BP not at goal this visit   Return in about 3 months (around 04/21/2021).    Huston Foley Tremane Spurgeon, FNP-BC [facility], [place] [address] Ph.  [phone] Fax [phone]

## 2021-01-22 NOTE — Patient Instructions (Addendum)
Firm/Compression socks/ TED socks Restart cholesterol medication  Peripheral Vascular Disease  Peripheral vascular disease (PVD) is a disease of the blood vessels that carry blood from the heart to the rest of the body. PVD is also called peripheral artery disease (PAD) or poor circulation. PVD affects most of the body. But it affects the legs and feet the most. PVD can lead to acute limb ischemia. This happens when there is a sudden stop of blood flow to an arm or leg. This is a medical emergency. What are the causes? The most common cause of PVD is a buildup of a fatty substance (plaque) inside your arteries. This decreases blood flow. Plaque can break off and block blood in a smaller artery. This can lead to acute limb ischemia. Other common causes of PVD include:  Blood clots inside the blood vessels.  Injuries to blood vessels.  Irritation and swelling of blood vessels.  Sudden tightening of the blood vessel (spasms). What increases the risk?  A family history of PVD.  Medical conditions, including: ? High cholesterol. ? Diabetes. ? High blood pressure. ? Heart disease. ? Past problems with blood clots. ? Past injury, such as burns or a broken bone.  Other conditions, such as: ? Buerger's disease. This is caused by swollen or irritated blood vessels in your hands and feet. ? Arthritis. ? Birth defects that affect the arteries in your legs. ? Kidney disease.  Using tobacco or nicotine products.  Not getting enough exercise.  Being very overweight (obese).  Being 93 years old or older. What are the signs or symptoms?  Cramps in your butt, legs, and feet.  Pain and weakness in your legs when you are active that goes away when you rest.  Leg pain when at rest.  Leg numbness, tingling, or weakness.  Coldness in a leg or foot, especially when compared with the other leg or foot.  Skin or hair changes. These can include: ? Hair loss. ? Shiny skin. ? Pale or  bluish skin. ? Thick toenails.  Being unable to get or keep an erection.  Tiredness (fatigue).  Weak pulse or no pulse in the feet.  Wounds and sores on the toes, feet, or legs. These take longer to heal. How is this treated? Underlying causes are treated first. Other conditions, like diabetes, high cholesterol, and blood pressure, are also treated. Treatment may include:  Lifestyle changes, such as: ? Quitting smoking. ? Getting regular exercise. ? Having a diet low in fat and cholesterol. ? Not drinking alcohol.  Taking medicines, such as: ? Blood thinners. ? Medicines to improve blood flow. ? Medicines to improve your blood cholesterol.  Procedures to: ? Open the arteries and restore blood flow. ? Insert a small mesh tube (stent) to keep a blocked vessel open. ? Create a new path for blood to flow to the body (peripheral bypass). ? Remove dead tissue from a wound. ? Remove an affected leg or arm. Follow these instructions at home: Medicines  Take over-the-counter and prescription medicines only as told by your doctor.  If you are taking blood thinners: ? Talk with your doctor before you take any medicines that have aspirin, or NSAIDs, such as ibuprofen. ? Take medicines exactly as told. Take them at the same time each day. ? Avoid doing things that could hurt or bruise you. Take action to prevent falls. ? Wear an alert bracelet or carry a card that shows you are taking blood thinners. Lifestyle  Get regular exercise.  Ask your doctor about how to stay active.  Talk with your doctor about keeping a healthy weight. If needed, ask about losing weight.  Eat a diet that is low in fat and cholesterol. If you need help, talk with your doctor.  Do not drink alcohol.  Do not smoke or use any products that contain nicotine or tobacco. If you need help quitting, ask your doctor.      General instructions  Take good care of your feet. To do this: ? Wear shoes that fit  well and feel good. ? Check your feet often for any cuts or sores.  Get a flu shot (influenza vaccine) each year.  Keep all follow-up visits. Where to find more information  Society for Vascular Surgery: vascular.org  American Heart Association: heart.org  National Heart, Lung, and Blood Institute: https://www.hartman-hill.biz/ Contact a doctor if:  You have cramps in your legs when you walk.  You have leg pain when you rest.  Your leg or foot feels cold.  Your skin changes.  You cannot get or keep an erection.  You have cuts or sores on your legs or feet that do not heal. Get help right away if:  You have sudden changes in the color and feeling of your arms or legs, such as: ? Your arm or leg turns cold, numb, and blue. ? Your arm or leg becomes red, warm, swollen, painful, or numb.  You have any signs of a stroke. "BE FAST" is an easy way to remember the main warning signs: ? B - Balance. Dizziness, sudden trouble walking, or loss of balance. ? E - Eyes. Trouble seeing or a change in how you see. ? F - Face. Sudden weakness or loss of feeling of the face. The face or eyelid may droop on one side. ? A - Arms. Weakness or loss of feeling in an arm. This happens all of a sudden and most often on one side of the body. ? S - Speech. Sudden trouble speaking, slurred speech, or trouble understanding what people say. ? T - Time. Time to call emergency services. Write down what time symptoms started.  You have other signs of a stroke, such as: ? A sudden, very bad headache with no known cause. ? Feeling like you may vomit (nausea). ? Vomiting. ? A seizure.  You have chest pain or trouble breathing. These symptoms may be an emergency. Get help right away. Call your local emergency services (911 in the [place]  Do not wait to see if the symptoms will go away.  Do not drive yourself to the hospital. Summary  Peripheral vascular disease (PVD) is a disease of the blood vessels.  PVD  affects the legs and feet the most.  Symptoms may include leg pain or leg numbness, tingling, and weakness.  Treatment may include lifestyle changes, medicines, and procedures. This information is not intended to replace advice given to you by your health care provider. Make sure you discuss any questions you have with your health care provider. Document Revised: 06/03/2020 Document Reviewed: 06/03/2020 Elsevier Patient Education  2021 [employer].    If you have lab work done today you will be contacted with your lab results within the next 2 weeks.  If you have not heard from [place] then please contact us. The fastest way to get your results is to register for My Chart.   IF you received an x-ray today, you will receive an invoice from [facility]. Please contact [facility] at [phone] with questions or concerns regarding your invoice.   IF you received labwork today, you will receive an invoice from [facility]. Please contact [facility] at [phone] with questions or concerns regarding your invoice.   Our billing staff will not be able to assist you with questions regarding bills from these companies.  You will be contacted with the lab results as soon as they are available. The fastest way to get your results is to activate your My Chart account. Instructions are located on the last page of this paperwork. If you have not heard from [place] regarding the results in 2 weeks, please contact this office.

## 2021-01-23 LAB — CMP14+EGFR
ALT: 12 IU/L (ref 0–32)
AST: 15 IU/L (ref 0–40)
Albumin/Globulin Ratio: 2 (ref 1.2–2.2)
Albumin: 4.5 g/dL (ref 3.8–4.8)
Alkaline Phosphatase: 66 IU/L (ref 44–121)
BUN/Creatinine Ratio: 14 (ref 12–28)
BUN: 12 mg/dL (ref 8–27)
Bilirubin Total: 0.2 mg/dL (ref 0.0–1.2)
CO2: 24 mmol/L (ref 20–29)
Calcium: 9.4 mg/dL (ref 8.7–10.3)
Chloride: 102 mmol/L (ref 96–106)
Creatinine, Ser: 0.88 mg/dL (ref 0.57–1.00)
GFR calc Af Amer: 79 mL/min/{1.73_m2} (ref >59)
GFR calc non Af Amer: 68 mL/min/{1.73_m2} (ref >59)
Globulin, Total: 2.3 g/dL (ref 1.5–4.5)
Glucose: 87 mg/dL (ref 65–99)
Potassium: 3.9 mmol/L (ref 3.5–5.2)
Sodium: 141 mmol/L (ref 134–144)
Total Protein: 6.8 g/dL (ref 6.0–8.5)

## 2021-01-23 LAB — BRAIN NATRIURETIC PEPTIDE: BNP: 16.2 pg/mL (ref 0.0–100.0)

## 2021-01-24 ENCOUNTER — Encounter: Payer: Self-pay | Admitting: Family Medicine

## 2021-01-27 NOTE — Telephone Encounter (Signed)
Pt saw labs is asking about heart function?   Also reports restarted Statin and wearing TED socks?

## 2021-02-04 ENCOUNTER — Other Ambulatory Visit: Payer: Self-pay | Admitting: Family Medicine

## 2021-02-04 NOTE — Telephone Encounter (Signed)
Requested medications are due for refill today. Unknown  Requested medications are on the active medications list.  Yes - but per note 2/22 - nol onger taking  Last refill. 01/23/2020  Future visit scheduled.   yes  Notes to clinic.  I am unable figure out if this pt is taking this medication. Please advise.

## 2021-02-25 ENCOUNTER — Encounter: Payer: Self-pay | Admitting: Family Medicine

## 2021-02-25 ENCOUNTER — Other Ambulatory Visit: Payer: Self-pay

## 2021-02-25 ENCOUNTER — Ambulatory Visit (INDEPENDENT_AMBULATORY_CARE_PROVIDER_SITE_OTHER): Payer: Medicare Other | Admitting: Family Medicine

## 2021-02-25 VITALS — BP 136/79 | HR 93 | Temp 98.1°F | Ht 64.0 in | Wt 213.0 lb

## 2021-02-25 DIAGNOSIS — F3289 Other specified depressive episodes: Secondary | ICD-10-CM

## 2021-02-25 DIAGNOSIS — I10 Essential (primary) hypertension: Secondary | ICD-10-CM | POA: Diagnosis not present

## 2021-02-25 DIAGNOSIS — R5383 Other fatigue: Secondary | ICD-10-CM | POA: Diagnosis not present

## 2021-02-25 DIAGNOSIS — E785 Hyperlipidemia, unspecified: Secondary | ICD-10-CM

## 2021-02-25 DIAGNOSIS — D518 Other vitamin B12 deficiency anemias: Secondary | ICD-10-CM

## 2021-02-25 DIAGNOSIS — E559 Vitamin D deficiency, unspecified: Secondary | ICD-10-CM

## 2021-02-25 MED ORDER — DULOXETINE HCL 40 MG PO CPEP: 40.0000 mg | ORAL_CAPSULE | Freq: Every day | ORAL | 3 refills | Status: DC

## 2021-02-25 NOTE — Patient Instructions (Signed)
Health Maintenance After Age 68 After age 68, you are at a higher risk for certain long-term diseases and infections as well as injuries from falls. Falls are a major cause of broken bones and head injuries in people who are older than age 68. Getting regular preventive care can help to keep you healthy and well. Preventive care includes getting regular testing and making lifestyle changes as recommended by your health care provider. Talk with your health care provider about:  Which screenings and tests you should have. A screening is a test that checks for a disease when you have no symptoms.  A diet and exercise plan that is right for you. What should I know about screenings and tests to prevent falls? Screening and testing are the best ways to find a health problem early. Early diagnosis and treatment give you the best chance of managing medical conditions that are common after age 68. Certain conditions and lifestyle choices may make you more likely to have a fall. Your health care provider may recommend:  Regular vision checks. Poor vision and conditions such as cataracts can make you more likely to have a fall. If you wear glasses, make sure to get your prescription updated if your vision changes.  Medicine review. Work with your health care provider to regularly review all of the medicines you are taking, including over-the-counter medicines. Ask your health care provider about any side effects that may make you more likely to have a fall. Tell your health care provider if any medicines that you take make you feel dizzy or sleepy.  Osteoporosis screening. Osteoporosis is a condition that causes the bones to get weaker. This can make the bones weak and cause them to break more easily.  Blood pressure screening. Blood pressure changes and medicines to control blood pressure can make you feel dizzy.  Strength and balance checks. Your health care provider may recommend certain tests to check your  strength and balance while standing, walking, or changing positions.  Foot health exam. Foot pain and numbness, as well as not wearing proper footwear, can make you more likely to have a fall.  Depression screening. You may be more likely to have a fall if you have a fear of falling, feel emotionally low, or feel unable to do activities that you used to do.  Alcohol use screening. Using too much alcohol can affect your balance and may make you more likely to have a fall. What actions can I take to lower my risk of falls? General instructions  Talk with your health care provider about your risks for falling. Tell your health care provider if: ? You fall. Be sure to tell your health care provider about all falls, even ones that seem minor. ? You feel dizzy, sleepy, or off-balance.  Take over-the-counter and prescription medicines only as told by your health care provider. These include any supplements.  Eat a healthy diet and maintain a healthy weight. A healthy diet includes low-fat dairy products, low-fat (lean) meats, and fiber from whole grains, beans, and lots of fruits and vegetables. Home safety  Remove any tripping hazards, such as rugs, cords, and clutter.  Install safety equipment such as grab bars in bathrooms and safety rails on stairs.  Keep rooms and walkways well-lit. Activity  Follow a regular exercise program to stay fit. This will help you maintain your balance. Ask your health care provider what types of exercise are appropriate for you.  If you need a cane or walker,   use it as recommended by your health care provider.  Wear supportive shoes that have nonskid soles.   Lifestyle  Do not drink alcohol if your health care provider tells you not to drink.  If you drink alcohol, limit how much you have: ? 0-1 drink a day for women. ? 0-2 drinks a day for men.  Be aware of how much alcohol is in your drink. In the [place] one drink equals one typical bottle of beer (12  oz), one-half glass of wine (5 oz), or one shot of hard liquor (1 oz).  Do not use any products that contain nicotine or tobacco, such as cigarettes and e-cigarettes. If you need help quitting, ask your health care provider. Summary  Having a healthy lifestyle and getting preventive care can help to protect your health and wellness after age 68.  Screening and testing are the best way to find a health problem early and help you avoid having a fall. Early diagnosis and treatment give you the best chance for managing medical conditions that are more common for people who are older than age 68.  Falls are a major cause of broken bones and head injuries in people who are older than age 68. Take precautions to prevent a fall at home.  Work with your health care provider to learn what changes you can make to improve your health and wellness and to prevent falls. This information is not intended to replace advice given to you by your health care provider. Make sure you discuss any questions you have with your health care provider. Document Revised: 03/23/2019 Document Reviewed: 10/13/2017 Elsevier Patient Education  2021 [employer]vier Inc.  

## 2021-02-25 NOTE — Progress Notes (Signed)
3/15/20225:06 PM  Paula Meyer [birth year], 70 y.o., female [id number]  Chief Complaint  Patient presents with  . sleepiness    Mostly during the day and awake at night , cold feet, palpitations, appetite increase, dry skin and peeling  Phq9- 13     HPI:   Patient is a 70 y.o. female with past medical history significant for HTN, GERD, HLD, GAD who presents today for fatigue.   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 Having a hard time completing assignments due to fatigue Has noticed sleepiness during the day and insomnia at night Has had issues with cold feet, palpitations, increased appetite And dry and peeling skin Palpitations: had twice last month, happens when lays down This can cause her insomnia Had been on Cymbalta in the past She is concerned it may be her anemia or electrolyte imbalance PHQ-9=13  Has had issues with weight gain over the past few months Knows she needs to walk more Wt Readings from Last 3 Encounters:  02/25/21 213 lb (96.6 kg)  01/22/21 213 lb (96.6 kg)  11/28/20 202 lb 6.4 oz (91.8 kg)   Lab Results  Component Value Date   CHOL 167 08/14/2020   HDL 57 08/14/2020   LDLCALC 99 08/14/2020   LDLDIRECT 188.6 07/10/2013   TRIG 54 08/14/2020   CHOLHDL 2.9 08/14/2020      Depression screen PHQ 2/9 02/25/2021 01/22/2021 11/28/2020  Decreased Interest 2 0 0  Down, Depressed, Hopeless 1 0 0  PHQ - 2 Score 3 0 0  Altered sleeping 3 - -  Tired, decreased energy 3 - -  Change in appetite 3 - -  Feeling bad or failure about yourself  0 - -  Trouble concentrating 1 - -  Moving slowly or fidgety/restless 0 - -  Suicidal thoughts 0 - -  PHQ-9 Score 13 - -  Difficult doing work/chores Very difficult - -    Fall Risk  01/22/2021 11/28/2020 07/25/2020 06/21/2020 02/29/2020  Falls in the past year? 0 0 0 0 0  Number falls in past yr: 0 0 0 0 0  Injury with Fall? 0 0 0 0 0  Follow up Falls evaluation completed Falls evaluation completed Falls evaluation completed  Falls evaluation completed Falls evaluation completed;Education provided     Allergies  Allergen Reactions  . Paxil [Paroxetine Hcl]     Makes her grind her teeth  . Bupropion Hcl Other (See Comments)    palpitations  . Contrast Media [Iodinated Diagnostic Agents] Hives and Itching  . Lisinopril     cough  . Tetracycline Nausea Only    Prior to Admission medications   Medication Sig Start Date End Date Taking? Authorizing Provider  albuterol (VENTOLIN HFA) 108 (90 Base) MCG/ACT inhaler INHALE 2 PUFFS INTO THE LUNGS EVERY 4 HOURS AS NEEDED FOR COUGH, WHEEZING OR SHORTNESS OF BREATH 07/12/19  Yes Jacelyn Pi, Lilia Argue, MD  aspirin EC 81 MG tablet Take 1 tablet (81 mg total) by mouth daily. 08/24/18  Yes [name], MD  diclofenac sodium (VOLTAREN) 1 % GEL Apply 2 g topically 4 (four) times daily. 10/10/19  Yes Jacelyn Pi, Irma M, MD  fluticasone (FLOVENT HFA) 110 MCG/ACT inhaler Inhale 2 puffs into the lungs 2 (two) times daily. 01/23/20  Yes Bobbitt, Sedalia Muta, MD  hydrochlorothiazide (HYDRODIURIL) 25 MG tablet Take 1 tablet (25 mg total) by mouth daily. 07/25/20  Yes Jacelyn Pi, Lilia Argue, MD  losartan (COZAAR) 50 MG tablet Take 1 tablet (50  mg total) by mouth daily. 07/25/20  Yes Jacelyn Pi, Lilia Argue, MD  MIMVEY 1-0.5 MG tablet Take 1 tablet by mouth daily. prn 06/17/16  Yes [provider]  potassium chloride (KLOR-CON) 10 MEQ tablet Take 1 tablet (10 mEq total) by mouth 2 (two) times daily. 11/28/20  Yes Posey Boyer, MD  triamcinolone cream (KENALOG) 0.1 % Apply 1 application topically 2 (two) times daily. 03/29/20  Yes Wieters, Hallie C, PA-C  Azelastine HCl 0.15 % SOLN USE 1 SPRAY IN THE NOSE TWICE DAILY Patient not taking: Reported on 01/22/2021 09/28/18   [name], MD  Azelastine-Fluticasone 137-50 MCG/ACT SUSP Place 1 spray into the nose 2 (two) times daily as needed. Patient not taking: Reported on 01/22/2021 01/23/20   Bobbitt, Sedalia Muta, MD  DULoxetine  (CYMBALTA) 30 MG capsule Take 1 capsule (30 mg total) by mouth daily. Patient not taking: No sig reported 06/21/20   Posey Boyer, MD  Olopatadine HCl (PATADAY) 0.2 % SOLN Place 1 drop into both eyes daily as needed. Patient not taking: Reported on 01/22/2021 01/23/20   Bobbitt, Sedalia Muta, MD    Past Medical History:  Diagnosis Date  . Allergy   . Anxiety   . Asthma   . Depression   . Hx of colonic polyps   . Hyperlipidemia   . Hypertension   . Osteoarthritis     Past Surgical History:  Procedure Laterality Date  . TUBAL LIGATION    . WISDOM TOOTH EXTRACTION      Social History   Tobacco Use  . Smoking status: Former Research scientist (life sciences)  . Smokeless tobacco: Never Used  Substance Use Topics  . Alcohol use: Yes    Alcohol/week: 3.0 standard drinks    Types: 3 Glasses of wine per week    Comment: rarely    Family History  Problem Relation Age of Onset  . Hyperlipidemia Brother   . Hypertension Brother   . Arthritis Other   . Depression Other   . Colon cancer Neg Hx   . Stomach cancer Neg Hx     Review of Systems  Constitutional: Positive for malaise/fatigue. Negative for chills and fever.  Eyes: Negative for blurred vision and double vision.  Respiratory: Negative for cough, shortness of breath and wheezing.   Cardiovascular: Positive for palpitations. Negative for chest pain and leg swelling.  Gastrointestinal: Negative for abdominal pain, blood in stool, constipation, diarrhea, heartburn, nausea and vomiting.  Genitourinary: Negative for dysuria, frequency and hematuria.  Musculoskeletal: Negative for back pain and joint pain.  Skin: Negative for rash.  Neurological: Negative for dizziness, tingling, sensory change, focal weakness, weakness and headaches.  Psychiatric/Behavioral: Positive for depression. Negative for suicidal ideas. The patient is nervous/anxious and has insomnia.      OBJECTIVE:  Today's Vitals   02/25/21 1629  BP: 136/79  Pulse: 93  Temp: 98.1  F (36.7 C)  SpO2: 96%  Weight: 213 lb (96.6 kg)  Height: 5' 4"  (1.626 m)   Body mass index is 36.56 kg/m.   Physical Exam Constitutional:      General: She is not in acute distress.    Appearance: Normal appearance. She is not ill-appearing.  HENT:     Head: Normocephalic.  Cardiovascular:     Rate and Rhythm: Normal rate and regular rhythm.     Pulses: Normal pulses.     Heart sounds: Normal heart sounds. No murmur heard. No friction rub. No gallop.   Pulmonary:  Effort: Pulmonary effort is normal. No respiratory distress.     Breath sounds: Normal breath sounds. No stridor. No wheezing, rhonchi or rales.  Abdominal:     General: Bowel sounds are normal.     Palpations: Abdomen is soft.     Tenderness: There is no abdominal tenderness.  Musculoskeletal:     Right lower leg: No edema.     Left lower leg: No edema.  Skin:    General: Skin is warm and dry.  Neurological:     Mental Status: She is alert and oriented to person, place, and time.  Psychiatric:        Mood and Affect: Mood normal.        Behavior: Behavior normal.     No results found for this or any previous visit (from the past 24 hour(s)).  No results found.   ASSESSMENT and PLAN  Problem List Items Addressed This Visit      Cardiovascular and Mediastinum   Essential hypertension, benign   Relevant Orders   CMP14+EGFR     Other   Hyperlipidemia with target LDL less than 130   Relevant Orders   Lipid Panel   B12 deficiency anemia   Relevant Orders   CBC with Differential   Iron, TIBC and Ferritin Panel   Vitamin B12   Vitamin D deficiency disease   Relevant Orders   Vitamin D, 25-hydroxy    Other Visit Diagnoses    Other depression    -  Primary   Relevant Medications   DULoxetine 40 MG CPEP   Fatigue, unspecified type       Relevant Orders   Magnesium   TSH      Plan . Will restart cymbalta . Refills sent . Will follow up for lab results    Return in about 3 months  (around 05/28/2021).    Huston Foley Georgana Romain, FNP-BC [facility] [employer], [place] [address] Ph.  [phone] Fax [phone]

## 2021-02-27 DIAGNOSIS — R5383 Other fatigue: Secondary | ICD-10-CM | POA: Diagnosis not present

## 2021-02-27 DIAGNOSIS — D518 Other vitamin B12 deficiency anemias: Secondary | ICD-10-CM | POA: Diagnosis not present

## 2021-02-27 DIAGNOSIS — E785 Hyperlipidemia, unspecified: Secondary | ICD-10-CM | POA: Diagnosis not present

## 2021-02-27 DIAGNOSIS — E559 Vitamin D deficiency, unspecified: Secondary | ICD-10-CM | POA: Diagnosis not present

## 2021-02-27 DIAGNOSIS — I10 Essential (primary) hypertension: Secondary | ICD-10-CM | POA: Diagnosis not present

## 2021-02-28 LAB — CMP14+EGFR
ALT: 40 IU/L — ABNORMAL HIGH (ref 0–32)
AST: 32 IU/L (ref 0–40)
Albumin/Globulin Ratio: 1.7 (ref 1.2–2.2)
Albumin: 4.2 g/dL (ref 3.8–4.8)
Alkaline Phosphatase: 59 IU/L (ref 44–121)
BUN/Creatinine Ratio: 16 (ref 12–28)
BUN: 13 mg/dL (ref 8–27)
Bilirubin Total: 0.2 mg/dL (ref 0.0–1.2)
CO2: 24 mmol/L (ref 20–29)
Calcium: 9.4 mg/dL (ref 8.7–10.3)
Chloride: 102 mmol/L (ref 96–106)
Creatinine, Ser: 0.83 mg/dL (ref 0.57–1.00)
Globulin, Total: 2.5 g/dL (ref 1.5–4.5)
Glucose: 92 mg/dL (ref 65–99)
Potassium: 3.6 mmol/L (ref 3.5–5.2)
Sodium: 143 mmol/L (ref 134–144)
Total Protein: 6.7 g/dL (ref 6.0–8.5)
eGFR: 77 mL/min/{1.73_m2} (ref >59)

## 2021-02-28 LAB — CBC WITH DIFFERENTIAL/PLATELET
Basophils Absolute: 0 10*3/uL (ref 0.0–0.2)
Basos: 1 %
EOS (ABSOLUTE): 0.1 10*3/uL (ref 0.0–0.4)
Eos: 2 %
Hematocrit: 33.8 % — ABNORMAL LOW (ref 34.0–46.6)
Hemoglobin: 10.8 g/dL — ABNORMAL LOW (ref 11.1–15.9)
Immature Grans (Abs): 0 10*3/uL (ref 0.0–0.1)
Immature Granulocytes: 0 %
Lymphocytes Absolute: 1.6 10*3/uL (ref 0.7–3.1)
Lymphs: 52 %
MCH: 26 pg — ABNORMAL LOW (ref 26.6–33.0)
MCHC: 32 g/dL (ref 31.5–35.7)
MCV: 81 fL (ref 79–97)
Monocytes Absolute: 0.3 10*3/uL (ref 0.1–0.9)
Monocytes: 9 %
Neutrophils Absolute: 1.2 10*3/uL — ABNORMAL LOW (ref 1.4–7.0)
Neutrophils: 36 %
Platelets: 178 10*3/uL (ref 150–450)
RBC: 4.15 x10E6/uL (ref 3.77–5.28)
RDW: 13.1 % (ref 11.7–15.4)
WBC: 3.2 10*3/uL — ABNORMAL LOW (ref 3.4–10.8)

## 2021-02-28 LAB — IRON,TIBC AND FERRITIN PANEL
Ferritin: 137 ng/mL (ref 15–150)
Iron Saturation: 31 % (ref 15–55)
Iron: 94 ug/dL (ref 27–139)
Total Iron Binding Capacity: 300 ug/dL (ref 250–450)
UIBC: 206 ug/dL (ref 118–369)

## 2021-02-28 LAB — VITAMIN B12: Vitamin B-12: 611 pg/mL (ref 232–1245)

## 2021-02-28 LAB — VITAMIN D 25 HYDROXY (VIT D DEFICIENCY, FRACTURES): Vit D, 25-Hydroxy: 32.9 ng/mL (ref 30.0–100.0)

## 2021-02-28 LAB — LIPID PANEL
Chol/HDL Ratio: 3.6 ratio (ref 0.0–4.4)
Cholesterol, Total: 169 mg/dL (ref 100–199)
HDL: 47 mg/dL (ref >39)
LDL Chol Calc (NIH): 106 mg/dL — ABNORMAL HIGH (ref 0–99)
Triglycerides: 87 mg/dL (ref 0–149)
VLDL Cholesterol Cal: 16 mg/dL (ref 5–40)

## 2021-02-28 LAB — TSH: TSH: 3.8 u[IU]/mL (ref 0.450–4.500)

## 2021-02-28 LAB — MAGNESIUM: Magnesium: 1.9 mg/dL (ref 1.6–2.3)

## 2021-03-03 ENCOUNTER — Encounter: Payer: Self-pay | Admitting: Family Medicine

## 2021-03-03 NOTE — Telephone Encounter (Signed)
Pt wants to discuss all abnormal levels please advise

## 2021-03-03 NOTE — Telephone Encounter (Signed)
Pt requesting fenofibrate

## 2021-03-07 DIAGNOSIS — M130 Polyarthritis, unspecified: Secondary | ICD-10-CM | POA: Diagnosis not present

## 2021-03-07 DIAGNOSIS — E7211 Homocystinuria: Secondary | ICD-10-CM | POA: Diagnosis not present

## 2021-03-07 DIAGNOSIS — M488X9 Other specified spondylopathies, site unspecified: Secondary | ICD-10-CM | POA: Diagnosis not present

## 2021-03-07 DIAGNOSIS — R5383 Other fatigue: Secondary | ICD-10-CM | POA: Diagnosis not present

## 2021-03-07 DIAGNOSIS — M47816 Spondylosis without myelopathy or radiculopathy, lumbar region: Secondary | ICD-10-CM | POA: Diagnosis not present

## 2021-03-12 DIAGNOSIS — M25871 Other specified joint disorders, right ankle and foot: Secondary | ICD-10-CM | POA: Diagnosis not present

## 2021-03-12 DIAGNOSIS — M19042 Primary osteoarthritis, left hand: Secondary | ICD-10-CM | POA: Diagnosis not present

## 2021-03-12 DIAGNOSIS — M1990 Unspecified osteoarthritis, unspecified site: Secondary | ICD-10-CM | POA: Diagnosis not present

## 2021-03-12 DIAGNOSIS — M79672 Pain in left foot: Secondary | ICD-10-CM | POA: Diagnosis not present

## 2021-03-12 DIAGNOSIS — M2550 Pain in unspecified joint: Secondary | ICD-10-CM | POA: Diagnosis not present

## 2021-03-12 DIAGNOSIS — M1711 Unilateral primary osteoarthritis, right knee: Secondary | ICD-10-CM | POA: Diagnosis not present

## 2021-03-12 DIAGNOSIS — M79642 Pain in left hand: Secondary | ICD-10-CM | POA: Diagnosis not present

## 2021-03-12 DIAGNOSIS — M25872 Other specified joint disorders, left ankle and foot: Secondary | ICD-10-CM | POA: Diagnosis not present

## 2021-03-12 DIAGNOSIS — M79643 Pain in unspecified hand: Secondary | ICD-10-CM | POA: Diagnosis not present

## 2021-03-12 DIAGNOSIS — M549 Dorsalgia, unspecified: Secondary | ICD-10-CM | POA: Diagnosis not present

## 2021-03-12 DIAGNOSIS — M1712 Unilateral primary osteoarthritis, left knee: Secondary | ICD-10-CM | POA: Diagnosis not present

## 2021-03-12 DIAGNOSIS — M25561 Pain in right knee: Secondary | ICD-10-CM | POA: Diagnosis not present

## 2021-03-12 DIAGNOSIS — M79641 Pain in right hand: Secondary | ICD-10-CM | POA: Diagnosis not present

## 2021-03-12 DIAGNOSIS — M79671 Pain in right foot: Secondary | ICD-10-CM | POA: Diagnosis not present

## 2021-03-12 DIAGNOSIS — E669 Obesity, unspecified: Secondary | ICD-10-CM | POA: Diagnosis not present

## 2021-03-12 DIAGNOSIS — M791 Myalgia, unspecified site: Secondary | ICD-10-CM | POA: Diagnosis not present

## 2021-03-12 DIAGNOSIS — R768 Other specified abnormal immunological findings in serum: Secondary | ICD-10-CM | POA: Diagnosis not present

## 2021-03-12 DIAGNOSIS — M533 Sacrococcygeal disorders, not elsewhere classified: Secondary | ICD-10-CM | POA: Diagnosis not present

## 2021-03-12 DIAGNOSIS — M47816 Spondylosis without myelopathy or radiculopathy, lumbar region: Secondary | ICD-10-CM | POA: Diagnosis not present

## 2021-03-12 DIAGNOSIS — M25562 Pain in left knee: Secondary | ICD-10-CM | POA: Diagnosis not present

## 2021-03-12 DIAGNOSIS — Z79899 Other long term (current) drug therapy: Secondary | ICD-10-CM | POA: Diagnosis not present

## 2021-03-12 DIAGNOSIS — M19041 Primary osteoarthritis, right hand: Secondary | ICD-10-CM | POA: Diagnosis not present

## 2021-03-18 DIAGNOSIS — M79651 Pain in right thigh: Secondary | ICD-10-CM | POA: Diagnosis not present

## 2021-03-18 DIAGNOSIS — M5459 Other low back pain: Secondary | ICD-10-CM | POA: Diagnosis not present

## 2021-03-27 DIAGNOSIS — M47816 Spondylosis without myelopathy or radiculopathy, lumbar region: Secondary | ICD-10-CM | POA: Diagnosis not present

## 2021-03-27 NOTE — Telephone Encounter (Signed)
Opened in error

## 2021-04-08 DIAGNOSIS — M549 Dorsalgia, unspecified: Secondary | ICD-10-CM | POA: Diagnosis not present

## 2021-04-08 DIAGNOSIS — M1990 Unspecified osteoarthritis, unspecified site: Secondary | ICD-10-CM | POA: Diagnosis not present

## 2021-04-08 DIAGNOSIS — R768 Other specified abnormal immunological findings in serum: Secondary | ICD-10-CM | POA: Diagnosis not present

## 2021-04-08 DIAGNOSIS — Z79899 Other long term (current) drug therapy: Secondary | ICD-10-CM | POA: Diagnosis not present

## 2021-04-08 DIAGNOSIS — M79643 Pain in unspecified hand: Secondary | ICD-10-CM | POA: Diagnosis not present

## 2021-04-08 DIAGNOSIS — E669 Obesity, unspecified: Secondary | ICD-10-CM | POA: Diagnosis not present

## 2021-04-08 DIAGNOSIS — R21 Rash and other nonspecific skin eruption: Secondary | ICD-10-CM | POA: Diagnosis not present

## 2021-04-10 ENCOUNTER — Other Ambulatory Visit: Payer: Self-pay | Admitting: Rheumatology

## 2021-04-10 DIAGNOSIS — M47816 Spondylosis without myelopathy or radiculopathy, lumbar region: Secondary | ICD-10-CM | POA: Diagnosis not present

## 2021-04-10 DIAGNOSIS — M461 Sacroiliitis, not elsewhere classified: Secondary | ICD-10-CM

## 2021-04-11 DIAGNOSIS — M545 Low back pain, unspecified: Secondary | ICD-10-CM | POA: Diagnosis not present

## 2021-04-15 DIAGNOSIS — M545 Low back pain, unspecified: Secondary | ICD-10-CM | POA: Diagnosis not present

## 2021-04-17 DIAGNOSIS — M545 Low back pain, unspecified: Secondary | ICD-10-CM | POA: Diagnosis not present

## 2021-04-21 ENCOUNTER — Ambulatory Visit: Payer: Medicare Other | Admitting: Family Medicine

## 2021-04-22 DIAGNOSIS — M545 Low back pain, unspecified: Secondary | ICD-10-CM | POA: Diagnosis not present

## 2021-04-28 ENCOUNTER — Other Ambulatory Visit: Payer: Self-pay

## 2021-04-28 ENCOUNTER — Ambulatory Visit
Admission: RE | Admit: 2021-04-28 | Discharge: 2021-04-28 | Disposition: A | Discharge status: Home / Self Care | Payer: Medicare Other | Source: Ambulatory Visit | Attending: Rheumatology | Admitting: Rheumatology

## 2021-04-28 DIAGNOSIS — M533 Sacrococcygeal disorders, not elsewhere classified: Secondary | ICD-10-CM | POA: Diagnosis not present

## 2021-04-28 DIAGNOSIS — M461 Sacroiliitis, not elsewhere classified: Secondary | ICD-10-CM

## 2021-04-28 DIAGNOSIS — M47818 Spondylosis without myelopathy or radiculopathy, sacral and sacrococcygeal region: Secondary | ICD-10-CM | POA: Diagnosis not present

## 2021-04-28 MED ORDER — GADOBENATE DIMEGLUMINE 529 MG/ML IV SOLN
20.0000 mL | Freq: Once | INTRAVENOUS | Status: AC | PRN
  Administered 2021-04-28: 20 mL via INTRAVENOUS

## 2021-05-01 DIAGNOSIS — M9905 Segmental and somatic dysfunction of pelvic region: Secondary | ICD-10-CM | POA: Diagnosis not present

## 2021-05-01 DIAGNOSIS — M9903 Segmental and somatic dysfunction of lumbar region: Secondary | ICD-10-CM | POA: Diagnosis not present

## 2021-05-01 DIAGNOSIS — M5116 Intervertebral disc disorders with radiculopathy, lumbar region: Secondary | ICD-10-CM | POA: Diagnosis not present

## 2021-05-01 DIAGNOSIS — M25551 Pain in right hip: Secondary | ICD-10-CM | POA: Diagnosis not present

## 2021-05-05 DIAGNOSIS — M9905 Segmental and somatic dysfunction of pelvic region: Secondary | ICD-10-CM | POA: Diagnosis not present

## 2021-05-05 DIAGNOSIS — M9903 Segmental and somatic dysfunction of lumbar region: Secondary | ICD-10-CM | POA: Diagnosis not present

## 2021-05-05 DIAGNOSIS — M5116 Intervertebral disc disorders with radiculopathy, lumbar region: Secondary | ICD-10-CM | POA: Diagnosis not present

## 2021-05-05 DIAGNOSIS — M25551 Pain in right hip: Secondary | ICD-10-CM | POA: Diagnosis not present

## 2021-05-06 DIAGNOSIS — M9905 Segmental and somatic dysfunction of pelvic region: Secondary | ICD-10-CM | POA: Diagnosis not present

## 2021-05-06 DIAGNOSIS — M9903 Segmental and somatic dysfunction of lumbar region: Secondary | ICD-10-CM | POA: Diagnosis not present

## 2021-05-06 DIAGNOSIS — M5116 Intervertebral disc disorders with radiculopathy, lumbar region: Secondary | ICD-10-CM | POA: Diagnosis not present

## 2021-05-06 DIAGNOSIS — M25551 Pain in right hip: Secondary | ICD-10-CM | POA: Diagnosis not present

## 2021-05-08 ENCOUNTER — Other Ambulatory Visit: Payer: Self-pay

## 2021-05-08 ENCOUNTER — Other Ambulatory Visit: Payer: Self-pay | Admitting: Chiropractic Medicine

## 2021-05-08 DIAGNOSIS — M25551 Pain in right hip: Secondary | ICD-10-CM | POA: Diagnosis not present

## 2021-05-08 DIAGNOSIS — N8000 Endometriosis of the uterus, unspecified: Secondary | ICD-10-CM

## 2021-05-08 DIAGNOSIS — M5116 Intervertebral disc disorders with radiculopathy, lumbar region: Secondary | ICD-10-CM | POA: Diagnosis not present

## 2021-05-08 DIAGNOSIS — M9905 Segmental and somatic dysfunction of pelvic region: Secondary | ICD-10-CM | POA: Diagnosis not present

## 2021-05-08 DIAGNOSIS — M9903 Segmental and somatic dysfunction of lumbar region: Secondary | ICD-10-CM | POA: Diagnosis not present

## 2021-05-13 DIAGNOSIS — M9905 Segmental and somatic dysfunction of pelvic region: Secondary | ICD-10-CM | POA: Diagnosis not present

## 2021-05-13 DIAGNOSIS — M5116 Intervertebral disc disorders with radiculopathy, lumbar region: Secondary | ICD-10-CM | POA: Diagnosis not present

## 2021-05-13 DIAGNOSIS — M25551 Pain in right hip: Secondary | ICD-10-CM | POA: Diagnosis not present

## 2021-05-13 DIAGNOSIS — M9903 Segmental and somatic dysfunction of lumbar region: Secondary | ICD-10-CM | POA: Diagnosis not present

## 2021-05-14 DIAGNOSIS — M25551 Pain in right hip: Secondary | ICD-10-CM | POA: Diagnosis not present

## 2021-05-14 DIAGNOSIS — M9903 Segmental and somatic dysfunction of lumbar region: Secondary | ICD-10-CM | POA: Diagnosis not present

## 2021-05-14 DIAGNOSIS — M5116 Intervertebral disc disorders with radiculopathy, lumbar region: Secondary | ICD-10-CM | POA: Diagnosis not present

## 2021-05-14 DIAGNOSIS — M9905 Segmental and somatic dysfunction of pelvic region: Secondary | ICD-10-CM | POA: Diagnosis not present

## 2021-05-15 DIAGNOSIS — M5116 Intervertebral disc disorders with radiculopathy, lumbar region: Secondary | ICD-10-CM | POA: Diagnosis not present

## 2021-05-15 DIAGNOSIS — M9905 Segmental and somatic dysfunction of pelvic region: Secondary | ICD-10-CM | POA: Diagnosis not present

## 2021-05-15 DIAGNOSIS — M9903 Segmental and somatic dysfunction of lumbar region: Secondary | ICD-10-CM | POA: Diagnosis not present

## 2021-05-15 DIAGNOSIS — M25551 Pain in right hip: Secondary | ICD-10-CM | POA: Diagnosis not present

## 2021-05-19 DIAGNOSIS — M9905 Segmental and somatic dysfunction of pelvic region: Secondary | ICD-10-CM | POA: Diagnosis not present

## 2021-05-19 DIAGNOSIS — M5116 Intervertebral disc disorders with radiculopathy, lumbar region: Secondary | ICD-10-CM | POA: Diagnosis not present

## 2021-05-19 DIAGNOSIS — M25551 Pain in right hip: Secondary | ICD-10-CM | POA: Diagnosis not present

## 2021-05-19 DIAGNOSIS — M9903 Segmental and somatic dysfunction of lumbar region: Secondary | ICD-10-CM | POA: Diagnosis not present

## 2021-05-20 DIAGNOSIS — M9905 Segmental and somatic dysfunction of pelvic region: Secondary | ICD-10-CM | POA: Diagnosis not present

## 2021-05-20 DIAGNOSIS — M9903 Segmental and somatic dysfunction of lumbar region: Secondary | ICD-10-CM | POA: Diagnosis not present

## 2021-05-20 DIAGNOSIS — M5116 Intervertebral disc disorders with radiculopathy, lumbar region: Secondary | ICD-10-CM | POA: Diagnosis not present

## 2021-05-20 DIAGNOSIS — M25551 Pain in right hip: Secondary | ICD-10-CM | POA: Diagnosis not present

## 2021-05-22 DIAGNOSIS — M9905 Segmental and somatic dysfunction of pelvic region: Secondary | ICD-10-CM | POA: Diagnosis not present

## 2021-05-22 DIAGNOSIS — M5116 Intervertebral disc disorders with radiculopathy, lumbar region: Secondary | ICD-10-CM | POA: Diagnosis not present

## 2021-05-22 DIAGNOSIS — M25551 Pain in right hip: Secondary | ICD-10-CM | POA: Diagnosis not present

## 2021-05-22 DIAGNOSIS — M9903 Segmental and somatic dysfunction of lumbar region: Secondary | ICD-10-CM | POA: Diagnosis not present

## 2021-05-26 DIAGNOSIS — M25551 Pain in right hip: Secondary | ICD-10-CM | POA: Diagnosis not present

## 2021-05-26 DIAGNOSIS — M9905 Segmental and somatic dysfunction of pelvic region: Secondary | ICD-10-CM | POA: Diagnosis not present

## 2021-05-26 DIAGNOSIS — M5116 Intervertebral disc disorders with radiculopathy, lumbar region: Secondary | ICD-10-CM | POA: Diagnosis not present

## 2021-05-26 DIAGNOSIS — M9903 Segmental and somatic dysfunction of lumbar region: Secondary | ICD-10-CM | POA: Diagnosis not present

## 2021-05-27 ENCOUNTER — Encounter: Payer: Self-pay | Admitting: Medical

## 2021-05-27 ENCOUNTER — Ambulatory Visit (INDEPENDENT_AMBULATORY_CARE_PROVIDER_SITE_OTHER): Payer: Medicare Other | Admitting: Medical

## 2021-05-27 ENCOUNTER — Other Ambulatory Visit: Payer: Self-pay

## 2021-05-27 VITALS — BP 130/80 | HR 72 | Ht 64.0 in | Wt 211.2 lb

## 2021-05-27 DIAGNOSIS — E785 Hyperlipidemia, unspecified: Secondary | ICD-10-CM | POA: Diagnosis not present

## 2021-05-27 DIAGNOSIS — I10 Essential (primary) hypertension: Secondary | ICD-10-CM

## 2021-05-27 DIAGNOSIS — R748 Abnormal levels of other serum enzymes: Secondary | ICD-10-CM | POA: Insufficient documentation

## 2021-05-27 DIAGNOSIS — D518 Other vitamin B12 deficiency anemias: Secondary | ICD-10-CM

## 2021-05-27 DIAGNOSIS — M7989 Other specified soft tissue disorders: Secondary | ICD-10-CM | POA: Diagnosis not present

## 2021-05-27 NOTE — Progress Notes (Signed)
Subjective:  Paula Meyer is a 70 y.o. female who presents for Chief Complaint  Patient presents with   New Patient (Initial Visit)    Pt present as a new patient to get established. Wants to discuss bilateral foot and ankle swelling. Wants liver enzyme checked as it was elevated previously but she feels it's related to her statin.      Here as a new patient.  Was seeing [name] prior to them closing.  Medical team: [facility] Dr. Lahoma Rocker, [facility]   Lately has had some swelling in both legs, feet and ankles.   At one point was wearing compression hose, but she had stopped for a while.  Lately found some new hose.   The hoarse has improved things but still has some foot and ankle swelling.   She had stopped taking Losartan BP medication, manufacturer changed, different colored pill.  The new tablet seemed to cause some swelling in legs.  She attributed some of the swelling to the losartan.   She is taking her HCTZ.  She avoids salt food.     She notes change in urination, no decreased urination, drinks a lot of water.  No dark urine  Denies CP, but sometimes can note some SOB.   Attributes this to pains in her body.  Until recently was getting achy all over, back, legs, arms, torso, but lately this is improved.  She has hx/o sciatica and hx/o DDD.   Seeing chiropractor for this.     Has been prescribed Naltrexone for diet pills but not currently taking this.    Last PCP had prescribed Duloxetine but she hasn't started back on this.  Was on prior.   Was on statin drug but stopped this as well.   She notes was on it initially, then stopped this over some fears of medication such as mental fog, liver inflammation.   She took it for 3 months.   After this liver tests were elevated so she has since stopped this medication.  Along the way had +ANA, ended up seeing rheumatology.  Had eval, was diagnosed with a specific arthritis.   Last labs was mid May.  Liver tests  were higher, so she didn't restart statin.  No concern for hepatitis testing.    No other aggravating or relieving factors.    No other c/o.   Past Medical History:  Diagnosis Date   Allergy    Anxiety    Asthma    Depression    Hx of colonic polyps    Hyperlipidemia    Hypertension    Osteoarthritis    Current Outpatient Medications on File Prior to Visit  Medication Sig Dispense Refill   acarbose (PRECOSE) 25 MG tablet Take 25 mg by mouth 3 (three) times daily with meals.     albuterol (VENTOLIN HFA) 108 (90 Base) MCG/ACT inhaler INHALE 2 PUFFS INTO THE LUNGS EVERY 4 HOURS AS NEEDED FOR COUGH, WHEEZING OR SHORTNESS OF BREATH 6.7 g 1   aspirin EC 81 MG tablet Take 1 tablet (81 mg total) by mouth daily. 90 tablet 1   Azelastine HCl 0.15 % SOLN USE 1 SPRAY IN THE NOSE TWICE DAILY 90 mL 1   Azelastine-Fluticasone 137-50 MCG/ACT SUSP PLACE 1 SPRAY INTO THE NOSE 2 (TWO) TIMES DAILY AS NEEDED. 23 g 1   diclofenac sodium (VOLTAREN) 1 % GEL Apply 2 g topically 4 (four) times daily. 100 g 1   fluticasone (FLOVENT HFA) 110 MCG/ACT  inhaler Inhale 2 puffs into the lungs 2 (two) times daily. 1 Inhaler 5   hydrochlorothiazide (HYDRODIURIL) 25 MG tablet Take 1 tablet (25 mg total) by mouth daily. 90 tablet 1   MIMVEY 1-0.5 MG tablet Take 1 tablet by mouth daily. prn  3   traMADol (ULTRAM) 50 MG tablet Take 50 mg by mouth 3 (three) times daily.     losartan (COZAAR) 50 MG tablet Take 1 tablet (50 mg total) by mouth daily. (Patient not taking: Reported on 05/27/2021) 90 tablet 1   No current facility-administered medications on file prior to visit.     The following portions of the patient's history were reviewed and updated as appropriate: allergies, current medications, past family history, past medical history, past social history, past surgical history and problem list.  ROS Otherwise as in subjective above    Objective: BP 130/80   Pulse 72   Ht 5\' 4"  (1.626 m)   Wt 211 lb 3.2 oz  (95.8 kg)   LMP 04/02/2011   SpO2 97%   BMI 36.25 kg/m   General appearance: alert, no distress, well developed, well nourished, African American female Neck: supple, no lymphadenopathy, no thyromegaly, no masses, no JVD or bruits Heart: RRR, normal S1, S2, no murmurs Lungs: CTA bilaterally, no wheezes, rhonchi, or rales Abdomen: +bs, soft, non tender, non distended, no masses, no hepatomegaly, no splenomegaly Pulses: 2+ radial pulses, 2+ pedal pulses, normal cap refill Ext: 1+ ankle  edema bilat, but otherwise normal     Assessment: Encounter Diagnoses  Name Primary?   Leg swelling Yes   Elevated liver enzymes    Hyperlipidemia with target LDL less than 130    Essential hypertension, benign    Other vitamin B12 deficiency anemia      Plan: We discussed her several concerns.  Since she just had labs done at another office we will try to request those records rather than repeating a bunch of labs today.  It sounds like the main concern was elevated liver test.  I reviewed all comprehensive panel labs in the chart record that was done in March.  Other than some mild anemia and slightly elevated liver test, kidney and electrolytes and other blood work was okay  Her swelling seems to be most likely dependent edema, venous insufficiency.  She notes improvements in the past when she wore compression hose.  I recommended she use leg elevation, compression hose restart, walk for exercise and to limit salt intake.  For now continue same medications including hydrochlorothiazide.  She is not currently taking losartan.  Her blood pressure is actually okay today.  So she will hold off on losartan for now since she is worried about this medication.  We will recheck lipids at her request today.  She is not currently taking a statin as there was concern that this was causing liver test elevation  There is also the possibility of fatty liver disease.  There is no recent imaging the chart record  but consider ultrasound abdomen  She declines hepatitis testing today, thinks it was done on recent labs  [name] was seen today for new patient (initial visit).  Diagnoses and all orders for this visit:  Leg swelling  Elevated liver enzymes  Hyperlipidemia with target LDL less than 130 -     Lipid panel  Essential hypertension, benign  Other vitamin B12 deficiency anemia   Follow up:  pending records

## 2021-05-28 DIAGNOSIS — M25551 Pain in right hip: Secondary | ICD-10-CM | POA: Diagnosis not present

## 2021-05-28 DIAGNOSIS — M9905 Segmental and somatic dysfunction of pelvic region: Secondary | ICD-10-CM | POA: Diagnosis not present

## 2021-05-28 DIAGNOSIS — M5116 Intervertebral disc disorders with radiculopathy, lumbar region: Secondary | ICD-10-CM | POA: Diagnosis not present

## 2021-05-28 DIAGNOSIS — M9903 Segmental and somatic dysfunction of lumbar region: Secondary | ICD-10-CM | POA: Diagnosis not present

## 2021-05-28 LAB — LIPID PANEL
Chol/HDL Ratio: 5.7 ratio — ABNORMAL HIGH (ref 0.0–4.4)
Cholesterol, Total: 280 mg/dL — ABNORMAL HIGH (ref 100–199)
HDL: 49 mg/dL (ref >39)
LDL Chol Calc (NIH): 212 mg/dL — ABNORMAL HIGH (ref 0–99)
Triglycerides: 109 mg/dL (ref 0–149)
VLDL Cholesterol Cal: 19 mg/dL (ref 5–40)

## 2021-06-03 ENCOUNTER — Telehealth: Payer: Self-pay | Admitting: Medical

## 2021-06-03 DIAGNOSIS — R768 Other specified abnormal immunological findings in serum: Secondary | ICD-10-CM | POA: Diagnosis not present

## 2021-06-03 DIAGNOSIS — R21 Rash and other nonspecific skin eruption: Secondary | ICD-10-CM | POA: Diagnosis not present

## 2021-06-03 DIAGNOSIS — M549 Dorsalgia, unspecified: Secondary | ICD-10-CM | POA: Diagnosis not present

## 2021-06-03 DIAGNOSIS — L4050 Arthropathic psoriasis, unspecified: Secondary | ICD-10-CM | POA: Diagnosis not present

## 2021-06-03 DIAGNOSIS — M79643 Pain in unspecified hand: Secondary | ICD-10-CM | POA: Diagnosis not present

## 2021-06-03 DIAGNOSIS — E669 Obesity, unspecified: Secondary | ICD-10-CM | POA: Diagnosis not present

## 2021-06-03 DIAGNOSIS — M1990 Unspecified osteoarthritis, unspecified site: Secondary | ICD-10-CM | POA: Diagnosis not present

## 2021-06-03 DIAGNOSIS — R748 Abnormal levels of other serum enzymes: Secondary | ICD-10-CM | POA: Diagnosis not present

## 2021-06-03 DIAGNOSIS — Z79899 Other long term (current) drug therapy: Secondary | ICD-10-CM | POA: Diagnosis not present

## 2021-06-03 NOTE — Telephone Encounter (Signed)
I received labs and records from rheumatology.  Please get her in to discuss the elevated liver test further

## 2021-06-03 NOTE — Telephone Encounter (Signed)
Sent patient a a message on mychart to call an schedule her appointment.

## 2021-06-04 ENCOUNTER — Telehealth: Payer: Self-pay | Admitting: Medical

## 2021-06-04 DIAGNOSIS — M9903 Segmental and somatic dysfunction of lumbar region: Secondary | ICD-10-CM | POA: Diagnosis not present

## 2021-06-04 DIAGNOSIS — M9905 Segmental and somatic dysfunction of pelvic region: Secondary | ICD-10-CM | POA: Diagnosis not present

## 2021-06-04 DIAGNOSIS — M25551 Pain in right hip: Secondary | ICD-10-CM | POA: Diagnosis not present

## 2021-06-04 DIAGNOSIS — M5116 Intervertebral disc disorders with radiculopathy, lumbar region: Secondary | ICD-10-CM | POA: Diagnosis not present

## 2021-06-04 NOTE — Telephone Encounter (Signed)
Pt called and left a vm requesting a refill on her tramadol please send to the [id number] - [place], [place]

## 2021-06-05 ENCOUNTER — Telehealth: Payer: Self-pay | Admitting: Medical

## 2021-06-05 NOTE — Telephone Encounter (Signed)
Patient was taking prn back pain. Patient got a refill approved already at pharmacy.

## 2021-06-05 NOTE — Telephone Encounter (Signed)
Requested records received from [facility]

## 2021-06-09 DIAGNOSIS — L4050 Arthropathic psoriasis, unspecified: Secondary | ICD-10-CM | POA: Diagnosis not present

## 2021-06-09 DIAGNOSIS — M5116 Intervertebral disc disorders with radiculopathy, lumbar region: Secondary | ICD-10-CM | POA: Diagnosis not present

## 2021-06-09 DIAGNOSIS — M9903 Segmental and somatic dysfunction of lumbar region: Secondary | ICD-10-CM | POA: Diagnosis not present

## 2021-06-09 DIAGNOSIS — M25551 Pain in right hip: Secondary | ICD-10-CM | POA: Diagnosis not present

## 2021-06-09 DIAGNOSIS — M9905 Segmental and somatic dysfunction of pelvic region: Secondary | ICD-10-CM | POA: Diagnosis not present

## 2021-06-17 DIAGNOSIS — M9903 Segmental and somatic dysfunction of lumbar region: Secondary | ICD-10-CM | POA: Diagnosis not present

## 2021-06-17 DIAGNOSIS — M25551 Pain in right hip: Secondary | ICD-10-CM | POA: Diagnosis not present

## 2021-06-17 DIAGNOSIS — M9905 Segmental and somatic dysfunction of pelvic region: Secondary | ICD-10-CM | POA: Diagnosis not present

## 2021-06-17 DIAGNOSIS — M5116 Intervertebral disc disorders with radiculopathy, lumbar region: Secondary | ICD-10-CM | POA: Diagnosis not present

## 2021-06-23 DIAGNOSIS — M25551 Pain in right hip: Secondary | ICD-10-CM | POA: Diagnosis not present

## 2021-06-23 DIAGNOSIS — M5116 Intervertebral disc disorders with radiculopathy, lumbar region: Secondary | ICD-10-CM | POA: Diagnosis not present

## 2021-06-23 DIAGNOSIS — M9905 Segmental and somatic dysfunction of pelvic region: Secondary | ICD-10-CM | POA: Diagnosis not present

## 2021-06-23 DIAGNOSIS — M9903 Segmental and somatic dysfunction of lumbar region: Secondary | ICD-10-CM | POA: Diagnosis not present

## 2021-06-24 ENCOUNTER — Ambulatory Visit: Payer: Medicare Other | Admitting: Medical

## 2021-06-30 DIAGNOSIS — M9905 Segmental and somatic dysfunction of pelvic region: Secondary | ICD-10-CM | POA: Diagnosis not present

## 2021-06-30 DIAGNOSIS — M5116 Intervertebral disc disorders with radiculopathy, lumbar region: Secondary | ICD-10-CM | POA: Diagnosis not present

## 2021-06-30 DIAGNOSIS — M25551 Pain in right hip: Secondary | ICD-10-CM | POA: Diagnosis not present

## 2021-06-30 DIAGNOSIS — M9903 Segmental and somatic dysfunction of lumbar region: Secondary | ICD-10-CM | POA: Diagnosis not present

## 2021-07-02 ENCOUNTER — Ambulatory Visit (INDEPENDENT_AMBULATORY_CARE_PROVIDER_SITE_OTHER): Payer: Medicare Other | Admitting: Medical

## 2021-07-02 ENCOUNTER — Other Ambulatory Visit: Payer: Self-pay

## 2021-07-02 VITALS — BP 120/80 | HR 83 | Wt 207.8 lb

## 2021-07-02 DIAGNOSIS — R748 Abnormal levels of other serum enzymes: Secondary | ICD-10-CM

## 2021-07-02 DIAGNOSIS — Z8249 Family history of ischemic heart disease and other diseases of the circulatory system: Secondary | ICD-10-CM

## 2021-07-02 DIAGNOSIS — R9431 Abnormal electrocardiogram [ECG] [EKG]: Secondary | ICD-10-CM | POA: Insufficient documentation

## 2021-07-02 DIAGNOSIS — Z136 Encounter for screening for cardiovascular disorders: Secondary | ICD-10-CM | POA: Insufficient documentation

## 2021-07-02 DIAGNOSIS — E785 Hyperlipidemia, unspecified: Secondary | ICD-10-CM

## 2021-07-02 DIAGNOSIS — I10 Essential (primary) hypertension: Secondary | ICD-10-CM

## 2021-07-02 MED ORDER — FENOFIBRATE 120 MG PO TABS: 1.0000 | ORAL_TABLET | Freq: Every day | ORAL | 2 refills | Status: DC

## 2021-07-02 MED ORDER — METHOCARBAMOL 500 MG PO TABS: 500.0000 mg | ORAL_TABLET | Freq: Every day | ORAL | 1 refills | Status: DC

## 2021-07-02 NOTE — Addendum Note (Signed)
Addended by: Minette Headland A on: 07/02/2021 04:06 PM   Modules accepted: Orders

## 2021-07-02 NOTE — Progress Notes (Signed)
Subjective:  Paula Meyer is a 70 y.o. female who presents for Chief Complaint  Patient presents with   1 month follow-up    1 month follow-up   Here for recheck.  I saw her as a new patient May 27, 2021.  She was seeing Paula Meyer prior to them closing.  Medical team: Dr. Latanya Maudlin, ortho Dr. Levy Pupa, spine/ortho Dr. Suella Broad, ortho Dr. Lahoma Rocker, [facility]  Last visit we discussed several concerns including leg swelling, obesity, mood, cholesterol and other concerns.  She has a history of high blood pressure, hyperlipidemia, asthma, allergies, depression anxiety, arthritis.  Here for recheck of liver test.  Her liver tests were elevated in the 40 range back in March and the labs had went up to around 60 but then recent blood test in June showed liver test back down to normal.  All other labs including blood counts, kidney, electrolytes, uric acid all normal in June and in March.  She has a lab panel with her today from June  She thinks the liver tests were elevated due to being on cholesterol medication.  She does have a history of high cholesterol and was put on medicine earlier this year at which time the liver tests were found to be elevated.  She also has been taking several pain pills a day.  She takes or has been taking on average 2 tramadol a day along with some ibuprofen and Tylenol for the last several months given the ongoing pain she has in her right hip and buttock.  She has been seeing chiropractor and back specialist.  They felt like she had a disc issue or pinched nerve but then recently she saw orthopedics and they felt like she may have more of a muscle issue in the buttock area.  She just saw Dr. Gladstone Lighter the other day and is having MRI coming up soon  Hypertension-she continues on hydrochlorothiazide 25 mg daily.  Her brother just passed away last week with a heart attack, has significant blockages.  She also found out that several of  her family members have high cholesterol.  Her brother was 18 She was on Crestor when the liver tests were up earlier this year.  Along the way had +ANA, ended up seeing rheumatology.  Had eval, was diagnosed with a specific arthritis.   Last labs was mid May.  Liver tests were higher, so she didn't restart statin.  No concern for hepatitis testing.    No other aggravating or relieving factors.    No other c/o.   Past Medical History:  Diagnosis Date   Allergy    Anxiety    Asthma    Depression    Hx of colonic polyps    Hyperlipidemia    Hypertension    Osteoarthritis    Current Outpatient Medications on File Prior to Visit  Medication Sig Dispense Refill   acarbose (PRECOSE) 25 MG tablet Take 25 mg by mouth 3 (three) times daily with meals.     albuterol (VENTOLIN HFA) 108 (90 Base) MCG/ACT inhaler INHALE 2 PUFFS INTO THE LUNGS EVERY 4 HOURS AS NEEDED FOR COUGH, WHEEZING OR SHORTNESS OF BREATH 6.7 g 1   aspirin EC 81 MG tablet Take 1 tablet (81 mg total) by mouth daily. 90 tablet 1   Azelastine HCl 0.15 % SOLN USE 1 SPRAY IN THE NOSE TWICE DAILY 90 mL 1   Azelastine-Fluticasone 137-50 MCG/ACT SUSP PLACE 1 SPRAY INTO THE NOSE 2 (TWO)  TIMES DAILY AS NEEDED. 23 g 1   diclofenac sodium (VOLTAREN) 1 % GEL Apply 2 g topically 4 (four) times daily. 100 g 1   fluticasone (FLOVENT HFA) 110 MCG/ACT inhaler Inhale 2 puffs into the lungs 2 (two) times daily. 1 Inhaler 5   hydrochlorothiazide (HYDRODIURIL) 25 MG tablet Take 1 tablet (25 mg total) by mouth daily. 90 tablet 1   traMADol (ULTRAM) 50 MG tablet Take 50 mg by mouth 3 (three) times daily.     hydroxychloroquine (PLAQUENIL) 200 MG tablet hydroxychloroquine 200 mg tablet  TWO TABLETS ORALLY DAILY 30 DAYS     No current facility-administered medications on file prior to visit.     The following portions of the patient's history were reviewed and updated as appropriate: allergies, current medications, past family history, past  medical history, past social history, past surgical history and problem list.  ROS Otherwise as in subjective above    Objective: BP 120/80   Pulse 83   Wt 207 lb 12.8 oz (94.3 kg)   LMP 04/02/2011   BMI 35.67 kg/m   BP Readings from Last 3 Encounters:  07/02/21 120/80  05/27/21 130/80  02/25/21 136/79   Wt Readings from Last 3 Encounters:  07/02/21 207 lb 12.8 oz (94.3 kg)  05/27/21 211 lb 3.2 oz (95.8 kg)  02/25/21 213 lb (96.6 kg)     General appearance: alert, no distress, well developed, well nourished, African American female Neck: supple, no lymphadenopathy, no thyromegaly, no masses, no JVD or bruits Heart: RRR, normal S1, S2, no murmurs Lungs: CTA bilaterally, no wheezes, rhonchi, or rales Abdomen: +bs, soft, non tender, non distended, no masses, no hepatomegaly, no splenomegaly Pulses: 2+ radial pulses, 2+ pedal pulses, normal cap refill Ext: 1+ ankle  edema bilat, but otherwise normal    Adult ECG Report  Indication: screen for heart disease  Rate: 72 bpm  Rhythm: normal sinus rhythm  QRS Axis: 14 degrees  PR Interval: 210ms  QRS Duration: 73ms  QTc: 437ms  Conduction Disturbances: none  Other Abnormalities:  T wave abnormality precordial leads  Patient's cardiac risk factors are: advanced age (older than 12 for men, 22 for women), dyslipidemia, and hypertension.  EKG comparison: none  Narrative Interpretation: abnormal EKG     Assessment: Encounter Diagnoses  Name Primary?   Hyperlipidemia with target LDL less than 130 Yes   Essential hypertension, benign    Elevated liver enzymes    Family history of heart disease    Screening for heart disease    Abnormal electrocardiogram (ECG) (EKG)     Abnormal EKG       Plan: Elevated liver test in the 40 range back in March.  Recent labs in June showed liver test but down to normal for ALT AST.  All of the labs for uric acid, CBC, comprehensive metabolic were normal  She had negative antibodies  for hepatitis A, B, and C in March.  Recent iron labs in the computer also normal.  We discussed possible differential for elevated liver test.  She is not drinking alcohol.  She does not have hepatitis or hemochromatosis per screening.  I suspect her liver test are elevated either due to statin drug or clearance of pain medicine over-the-counter.  She is agreeable to doing another test of cholesterol medication.  She does not want to use a statin today.  After discussing options for care she does not want to do a PSK 9 either.  She is going to begin  a trial of fenofibrate.  We will plan to recheck lipids in [redacted] weeks along with liver test at that time  Referral for CT coronary calcium score.  She notes T wave inversions on prior EKG although I don't have records for this .   F/u pending scan.  High blood pressure-continue current medication  Low back, leg and buttock pain-follow-up with orthopedics and chiropractor.  We discussed limiting pain medication given elevated liver test.  I advised either Tylenol or Aleve oral in the morning along with muscle rub topically.  Begin Robaxin in evening.  Caution on sedation.  She does not want to use Gabpaentin for now.  I advised I could not prescribed regular narcotic pain medications.     Paula Meyer was seen today for 1 month follow-up.  Diagnoses and all orders for this visit:  Hyperlipidemia with target LDL less than 130 -     CT CARDIAC SCORING ([facility] LOCATIONS ONLY); Future -     EKG 12-Lead  Essential hypertension, benign -     CT CARDIAC SCORING ([facility] LOCATIONS ONLY); Future -     EKG 12-Lead  Elevated liver enzymes  Family history of heart disease  Screening for heart disease -     CT CARDIAC SCORING ([facility] LOCATIONS ONLY); Future -     EKG 12-Lead  Abnormal electrocardiogram (ECG) (EKG)  -     CT CARDIAC SCORING ([facility] LOCATIONS ONLY); Future  Abnormal EKG  Other orders -     Fenofibrate 120 MG TABS; Take 1 tablet (120 mg total) by mouth  daily. -     methocarbamol (ROBAXIN) 500 MG tablet; Take 1 tablet (500 mg total) by mouth at bedtime.    F/u 6 weeks fasting

## 2021-07-07 ENCOUNTER — Other Ambulatory Visit: Payer: Self-pay | Admitting: Medical

## 2021-07-07 DIAGNOSIS — L4050 Arthropathic psoriasis, unspecified: Secondary | ICD-10-CM | POA: Diagnosis not present

## 2021-07-07 MED ORDER — FENOFIBRATE 160 MG PO TABS: 160.0000 mg | ORAL_TABLET | Freq: Every day | ORAL | 1 refills | Status: DC

## 2021-07-14 DIAGNOSIS — I2510 Atherosclerotic heart disease of native coronary artery without angina pectoris: Secondary | ICD-10-CM

## 2021-07-14 HISTORY — DX: Atherosclerotic heart disease of native coronary artery without angina pectoris: I25.10

## 2021-07-15 DIAGNOSIS — Z79899 Other long term (current) drug therapy: Secondary | ICD-10-CM | POA: Diagnosis not present

## 2021-07-15 DIAGNOSIS — L409 Psoriasis, unspecified: Secondary | ICD-10-CM | POA: Diagnosis not present

## 2021-07-15 DIAGNOSIS — R748 Abnormal levels of other serum enzymes: Secondary | ICD-10-CM | POA: Diagnosis not present

## 2021-07-15 DIAGNOSIS — M25572 Pain in left ankle and joints of left foot: Secondary | ICD-10-CM | POA: Diagnosis not present

## 2021-07-15 DIAGNOSIS — L4050 Arthropathic psoriasis, unspecified: Secondary | ICD-10-CM | POA: Diagnosis not present

## 2021-07-15 DIAGNOSIS — M1990 Unspecified osteoarthritis, unspecified site: Secondary | ICD-10-CM | POA: Diagnosis not present

## 2021-07-15 DIAGNOSIS — M79643 Pain in unspecified hand: Secondary | ICD-10-CM | POA: Diagnosis not present

## 2021-07-15 DIAGNOSIS — R768 Other specified abnormal immunological findings in serum: Secondary | ICD-10-CM | POA: Diagnosis not present

## 2021-07-15 DIAGNOSIS — M549 Dorsalgia, unspecified: Secondary | ICD-10-CM | POA: Diagnosis not present

## 2021-07-23 DIAGNOSIS — M79604 Pain in right leg: Secondary | ICD-10-CM | POA: Diagnosis not present

## 2021-07-23 DIAGNOSIS — M25551 Pain in right hip: Secondary | ICD-10-CM | POA: Diagnosis not present

## 2021-07-25 ENCOUNTER — Encounter: Payer: Self-pay | Admitting: Internal Medicine

## 2021-07-29 DIAGNOSIS — M79651 Pain in right thigh: Secondary | ICD-10-CM | POA: Diagnosis not present

## 2021-07-29 DIAGNOSIS — M7071 Other bursitis of hip, right hip: Secondary | ICD-10-CM | POA: Diagnosis not present

## 2021-07-30 ENCOUNTER — Other Ambulatory Visit: Payer: Self-pay

## 2021-07-30 ENCOUNTER — Ambulatory Visit
Admission: RE | Admit: 2021-07-30 | Discharge: 2021-07-30 | Disposition: A | Discharge status: Home / Self Care | Payer: No Typology Code available for payment source | Source: Ambulatory Visit | Attending: Medical | Admitting: Medical

## 2021-07-30 DIAGNOSIS — I10 Essential (primary) hypertension: Secondary | ICD-10-CM

## 2021-07-30 DIAGNOSIS — Z136 Encounter for screening for cardiovascular disorders: Secondary | ICD-10-CM

## 2021-07-30 DIAGNOSIS — R9431 Abnormal electrocardiogram [ECG] [EKG]: Secondary | ICD-10-CM

## 2021-07-30 DIAGNOSIS — Z8249 Family history of ischemic heart disease and other diseases of the circulatory system: Secondary | ICD-10-CM

## 2021-07-31 ENCOUNTER — Other Ambulatory Visit: Payer: Self-pay | Admitting: Medical

## 2021-07-31 DIAGNOSIS — Z8249 Family history of ischemic heart disease and other diseases of the circulatory system: Secondary | ICD-10-CM

## 2021-07-31 DIAGNOSIS — R9431 Abnormal electrocardiogram [ECG] [EKG]: Secondary | ICD-10-CM

## 2021-07-31 DIAGNOSIS — E785 Hyperlipidemia, unspecified: Secondary | ICD-10-CM

## 2021-07-31 DIAGNOSIS — I10 Essential (primary) hypertension: Secondary | ICD-10-CM

## 2021-07-31 NOTE — Progress Notes (Signed)
CT coronary results

## 2021-08-01 ENCOUNTER — Other Ambulatory Visit: Payer: Self-pay | Admitting: Medical

## 2021-08-01 MED ORDER — PRALUENT 75 MG/ML ~~LOC~~ SOAJ: 75.0000 mg | SUBCUTANEOUS | 5 refills | Status: DC

## 2021-08-01 MED ORDER — REPATHA 140 MG/ML ~~LOC~~ SOSY
140.0000 mg | PREFILLED_SYRINGE | SUBCUTANEOUS | 5 refills | Status: DC
Start: 2021-08-01 — End: 2021-10-02

## 2021-08-01 NOTE — Progress Notes (Signed)
Repatha

## 2021-08-01 NOTE — Progress Notes (Signed)
pfa

## 2021-08-02 ENCOUNTER — Telehealth: Payer: Self-pay

## 2021-08-02 NOTE — Telephone Encounter (Signed)
P.A. PRALUENT  

## 2021-08-05 ENCOUNTER — Other Ambulatory Visit: Payer: Self-pay | Admitting: Orthopedic Surgery

## 2021-08-05 ENCOUNTER — Telehealth: Payer: Self-pay

## 2021-08-05 DIAGNOSIS — M7071 Other bursitis of hip, right hip: Secondary | ICD-10-CM

## 2021-08-05 MED ORDER — DIPHENHYDRAMINE HCL 50 MG PO TABS: 50.0000 mg | ORAL_TABLET | Freq: Once | ORAL | 0 refills | Status: DC

## 2021-08-05 MED ORDER — PREDNISONE 50 MG PO TABS: ORAL_TABLET | ORAL | 0 refills | Status: DC

## 2021-08-05 NOTE — Telephone Encounter (Signed)
Phone call to patient to review instructions for 13 hr prep for Arthrogram w/ contrast on 08/06/21  at 11:30. [facility]. Pt aware and verbalized understanding of instructions.   Prescription: Pt to take 50 mg of prednisone on 08/05/21 at 10:30PM, 50 mg of prednisone on 08/06/21 at 04:30AM, and 50 mg of prednisone on 08/06/21 at 10:30AM. Pt is also to take 50 mg of benadryl on 08/06/21 at 10:30. Please call [phone] with any questions.

## 2021-08-06 ENCOUNTER — Ambulatory Visit
Admission: RE | Admit: 2021-08-06 | Discharge: 2021-08-06 | Disposition: A | Discharge status: Home / Self Care | Payer: Medicare Other | Source: Ambulatory Visit | Attending: Orthopedic Surgery | Admitting: Orthopedic Surgery

## 2021-08-06 ENCOUNTER — Other Ambulatory Visit: Payer: Self-pay

## 2021-08-06 DIAGNOSIS — M7071 Other bursitis of hip, right hip: Secondary | ICD-10-CM

## 2021-08-06 MED ORDER — METHYLPREDNISOLONE ACETATE 40 MG/ML INJ SUSP (RADIOLOG
80.0000 mg | Freq: Once | INTRAMUSCULAR | Status: AC
  Administered 2021-08-06: 80 mg via INTRALESIONAL

## 2021-08-06 MED ORDER — IOPAMIDOL (ISOVUE-M 200) INJECTION 41%
1.0000 mL | Freq: Once | INTRAMUSCULAR | Status: AC
  Administered 2021-08-06: 1 mL via INTRA_ARTICULAR

## 2021-08-07 ENCOUNTER — Encounter: Payer: Self-pay | Admitting: Medical

## 2021-08-08 NOTE — Telephone Encounter (Signed)
Additional questions answered & submitted

## 2021-08-11 NOTE — Telephone Encounter (Signed)
P.A. approved til 08/08/22, activated co pay card & called pharmacy went from $141 for $25 a month.  They are ordering and will be in tomorrow.  Called pt and informed, she will ask the pharmacy for instructions and call back if she has any issues.

## 2021-08-19 ENCOUNTER — Ambulatory Visit (INDEPENDENT_AMBULATORY_CARE_PROVIDER_SITE_OTHER): Payer: Medicare Other | Admitting: Medical

## 2021-08-19 ENCOUNTER — Other Ambulatory Visit: Payer: Self-pay

## 2021-08-19 VITALS — BP 120/70 | HR 67 | Wt 211.2 lb

## 2021-08-19 DIAGNOSIS — M8949 Other hypertrophic osteoarthropathy, multiple sites: Secondary | ICD-10-CM

## 2021-08-19 DIAGNOSIS — R9431 Abnormal electrocardiogram [ECG] [EKG]: Secondary | ICD-10-CM

## 2021-08-19 DIAGNOSIS — M5136 Other intervertebral disc degeneration, lumbar region: Secondary | ICD-10-CM

## 2021-08-19 DIAGNOSIS — M159 Polyosteoarthritis, unspecified: Secondary | ICD-10-CM

## 2021-08-19 DIAGNOSIS — Z8249 Family history of ischemic heart disease and other diseases of the circulatory system: Secondary | ICD-10-CM

## 2021-08-19 DIAGNOSIS — Z136 Encounter for screening for cardiovascular disorders: Secondary | ICD-10-CM | POA: Diagnosis not present

## 2021-08-19 DIAGNOSIS — R0989 Other specified symptoms and signs involving the circulatory and respiratory systems: Secondary | ICD-10-CM | POA: Diagnosis not present

## 2021-08-19 DIAGNOSIS — E785 Hyperlipidemia, unspecified: Secondary | ICD-10-CM

## 2021-08-19 DIAGNOSIS — R748 Abnormal levels of other serum enzymes: Secondary | ICD-10-CM | POA: Diagnosis not present

## 2021-08-19 DIAGNOSIS — Z780 Asymptomatic menopausal state: Secondary | ICD-10-CM | POA: Diagnosis not present

## 2021-08-19 DIAGNOSIS — I10 Essential (primary) hypertension: Secondary | ICD-10-CM | POA: Diagnosis not present

## 2021-08-19 DIAGNOSIS — M7989 Other specified soft tissue disorders: Secondary | ICD-10-CM

## 2021-08-19 DIAGNOSIS — E2839 Other primary ovarian failure: Secondary | ICD-10-CM

## 2021-08-19 NOTE — Progress Notes (Signed)
Subjective:  Paula Meyer is a 70 y.o. female who presents for Chief Complaint  Patient presents with   follow-up    Follow-up. Should she take praulent or repatha injection. She has not started any of them. Would like to discuss tramadol   Medical team: Dr. Latanya Maudlin, ortho Dr. Levy Pupa, spine/ortho Dr. Suella Broad, ortho Dr. Lahoma Rocker, [facility]  Here for f/u.    She has a medical history significant for hypertension, hyperlipidemia, depression, anxiety, allergies, asthma, osteoarthritis.  Here for f/u on cholesterol medication.  She notes that he has had issues with elevated liver test on statin drugs in the past.  She also may have had some myalgias on these medicines in the past.  She stopped them on her own.  She recently tried fenofibrate but thought this was causing her to have increased appetite so she stopped that as well.  We recently prescribed PSK 9 medication that took a while to get insurance approval.  Apparently she has approval now.  She has not yet started the medication.  She is supposed to go to the pharmacy today to figure out which one is cheaper out-of-pocket between [place] and Praluent, and she plans to go ahead and start this injection every 2 weeks.  She thinks her increased appetite and weight gain may in part be due to the injection she gets to her orthopedist.  She notes that she gets treatment per her orthopedist IV injection for arthritis, Simponi Aria/Golimumab.  She thinks this may be causing some increased appetite and weight gain  She would like some advice on helping lose weight.  She is considering doing hCG drops.  She has done this in the past.  She has a lot of pain in her hips right now and cannot work out and exercise like she would like to.  Leg swelling-she wears compression hose for this.  No recent significant swelling  No other aggravating or relieving factors.    No other c/o.   Past Medical  History:  Diagnosis Date   Allergy    Anxiety    Asthma    Depression    Hx of colonic polyps    Hyperlipidemia    Hypertension    Osteoarthritis    Current Outpatient Medications on File Prior to Visit  Medication Sig Dispense Refill   albuterol (VENTOLIN HFA) 108 (90 Base) MCG/ACT inhaler INHALE 2 PUFFS INTO THE LUNGS EVERY 4 HOURS AS NEEDED FOR COUGH, WHEEZING OR SHORTNESS OF BREATH 6.7 g 1   aspirin EC 81 MG tablet Take 1 tablet (81 mg total) by mouth daily. 90 tablet 1   Azelastine HCl 0.15 % SOLN USE 1 SPRAY IN THE NOSE TWICE DAILY 90 mL 1   Azelastine-Fluticasone 137-50 MCG/ACT SUSP PLACE 1 SPRAY INTO THE NOSE 2 (TWO) TIMES DAILY AS NEEDED. 23 g 1   diclofenac sodium (VOLTAREN) 1 % GEL Apply 2 g topically 4 (four) times daily. 100 g 1   diphenhydrAMINE (BENADRYL) 50 MG tablet Take 1 tablet (50 mg total) by mouth once for 1 dose. Pt is also to take 50 mg of benadryl on 08/06/21 at 10:30. Please call [phone] with any questions. 1 tablet 0   fluticasone (FLOVENT HFA) 110 MCG/ACT inhaler Inhale 2 puffs into the lungs 2 (two) times daily. 1 Inhaler 5   hydrochlorothiazide (HYDRODIURIL) 25 MG tablet Take 1 tablet (25 mg total) by mouth daily. 90 tablet 1   methocarbamol (ROBAXIN) 500 MG tablet Take  1 tablet (500 mg total) by mouth at bedtime. 30 tablet 1   Alirocumab (PRALUENT) 75 MG/ML SOAJ Inject 75 mg into the skin every 14 (fourteen) days. (Patient not taking: Reported on 08/19/2021) 2 mL 5   Evolocumab (REPATHA) 140 MG/ML SOSY Inject 140 mg into the skin every 14 (fourteen) days. (Patient not taking: Reported on 08/19/2021) 2.1 mL 5   traMADol (ULTRAM) 50 MG tablet Take 50 mg by mouth 3 (three) times daily. (Patient not taking: Reported on 08/19/2021)     No current facility-administered medications on file prior to visit.     The following portions of the patient's history were reviewed and updated as appropriate: allergies, current medications, past family history, past medical  history, past social history, past surgical history and problem list.  ROS Otherwise as in subjective above    Objective: BP 120/70   Pulse 67   Wt 211 lb 3.2 oz (95.8 kg)   LMP 04/02/2011   BMI 36.25 kg/m   BP Readings from Last 3 Encounters:  08/19/21 120/70  07/02/21 120/80  05/27/21 130/80   Wt Readings from Last 3 Encounters:  08/19/21 211 lb 3.2 oz (95.8 kg)  07/02/21 207 lb 12.8 oz (94.3 kg)  05/27/21 211 lb 3.2 oz (95.8 kg)   General appearance: alert, no distress, well developed, well nourished, African American female 1+ pedal pulses, no obvious edema today, compression hose on left lower leg present Othewrise not examined    Assessment: Encounter Diagnoses  Name Primary?   Primary osteoarthritis involving multiple joints Yes   Morbid obesity (HCC)    Leg swelling    Hyperlipidemia with target LDL less than 130    Family history of heart disease    Essential hypertension, benign    Elevated liver enzymes    DDD (degenerative disc disease), lumbar    Abnormal electrocardiogram (ECG) (EKG)     Encounter for screening for vascular disease    Decreased pedal pulses    Post-menopause    Estrogen deficiency        Plan: Hyperlipidemia  Prior elevated LFTs with statins Brief trial of fenofibrate without problems Today we discussed trial of either Repatha or Praluent whichever is cheaper out of pocket We will plan to recheck lipids and liver tests in 6- 8 weeks fasting Of note, CT calcium score in 07/30/2021 showed atherosclerosis in aorta and CAD, percentile 99% She has first visit scheduled to see cardiology in mid to late October  Screening for osteoporosis, postmenopausal and estrogen deficiency-she is going to consider [facility] screening which includes bone density test versus getting bone density test ordered through our office.  She will let me know which way she wants to go with this.  Her last bone density test in 2020 was normal.  I reviewed  this document in the chart record.  Osteoarthritis-continue routine follow-up with orthopedics.  They are managing her treatment for this.  Abnormal EKG, hypertension, coronary artery disease and aortic atherosclerosis -we discussed her CT coronary score that showed coronary artery disease and aortic atherosclerosis.  Continue same plan for lipids above with the plan to start Praluent.  She has appointment with cardiology for consult on abnormal EKG in October.  We discussed that she will likely end up having a stress test  Decreased pedal pulses-she may move forward with a [facility] screening through a private party where she can do carotid artery screening, ABI, aortic aneurysm screen and bone density test for cash pay $150.  If  she decides not to do this we can certainly order an ABI screening through vascular  Obesity-we discussed different strategies to help lose weight.  She is not interested in weight loss surgery.  She declines weight management clinic referral.  She will check insurance coverage on some medicines we discussed such as Qsymia Saxenda or Mounjaro.   Cutting back on meat products.  She says she is not eating lots of sweets or sugar at this time   Elevated liver test in the 40 range back in March.  Recent labs in June showed liver test but down to normal for ALT AST.  All of the labs for uric acid, CBC, comprehensive metabolic were normal  She had negative antibodies for hepatitis A, B, and C in March.  Recent iron labs in the computer also normal.  We discussed possible differential for elevated liver test.  She is not drinking alcohol.  She does not have hepatitis or hemochromatosis per screening.  I suspect her liver test are elevated either due to statin drug or clearance of pain medicine over-the-counter.  She is agreeable to doing another test of cholesterol medication.  She does not want to use a statin today.  Paula Meyer was seen today for follow-up.  Diagnoses and all orders  for this visit:  Primary osteoarthritis involving multiple joints  Morbid obesity (HCC)  Leg swelling  Hyperlipidemia with target LDL less than 130 -     Lipid panel; Future -     Hepatic function panel; Future  Family history of heart disease  Essential hypertension, benign  Elevated liver enzymes -     Hepatic function panel; Future  DDD (degenerative disc disease), lumbar  Abnormal electrocardiogram (ECG) (EKG)   Encounter for screening for vascular disease  Decreased pedal pulses  Post-menopause  Estrogen deficiency  Spent > 45 minutes face to face with patient in discussion of symptoms, evaluation, plan and recommendations.    F/u 8 weeks fasting

## 2021-08-26 DIAGNOSIS — M25551 Pain in right hip: Secondary | ICD-10-CM | POA: Diagnosis not present

## 2021-09-01 DIAGNOSIS — L4050 Arthropathic psoriasis, unspecified: Secondary | ICD-10-CM | POA: Diagnosis not present

## 2021-09-15 ENCOUNTER — Ambulatory Visit: Payer: Medicare Other | Admitting: Medical

## 2021-09-29 ENCOUNTER — Other Ambulatory Visit: Payer: Self-pay | Admitting: Medical

## 2021-09-29 DIAGNOSIS — I10 Essential (primary) hypertension: Secondary | ICD-10-CM

## 2021-09-29 MED ORDER — HYDROCHLOROTHIAZIDE 25 MG PO TABS: 25.0000 mg | ORAL_TABLET | Freq: Every day | ORAL | 0 refills | Status: DC

## 2021-10-01 NOTE — Progress Notes (Signed)
Cardiology Office Note:    Date:  10/02/2021  NAME:  Paula Meyer    MRN: [id number] DOB:  12-29-1950   PCP:  Carlena Hurl, PA-C  Cardiologist:  None  Electrophysiologist:  None   Referring MD: Carlena Hurl, PA-C   Chief Complaint  Patient presents with   Coronary Artery Disease    History of Present Illness:    Paula Meyer is a 70 y.o. female with a hx of HTN, HLD, CAD who is being seen today for the evaluation of CAD at the request of Carlena Hurl, PA-C.  She reports she was recently diagnosed with elevated coronary calcium score.  She has elevated LDL cholesterol.  Values are over 200.  She apparently has had severely elevated cholesterol her entire life.  She informs me that she is had 2 brothers who have both had heart attacks.  1 died suddenly.  Her brother had heart attacks in his early 34s.  She does have 2 daughters.  No grandchildren.  She reports she is not active but has no chest pain or trouble breathing.  She does suffer from several orthopedic issues including back issues.  No regular exercise due to this.  She was on statin medications but this was stopped due to elevated liver enzymes.  She was recently started on Praluent.  She has been on this for 2 months and will have recheck lipid profiles tomorrow.  Her EKG in office demonstrates normal sinus rhythm with T wave inversions in the inferior and anterior leads.  Apparently she has had these T wave changes for years.  She had echocardiograms in the past that were normal but I cannot see the results.  She has a never smoker in my opinion.  She may have smoked for 4 years and her earlier years.  She denies any significant alcohol or drug use.  She is currently retired.  She has 2 daughters.  She denies any chest pain or trouble breathing in office.  She is concerned that she may have a genetic etiology for her elevated cholesterol.  I highly suspect she does.  We discussed further testing to make sure  everything is okay.  Problem List HTN HLD -T chol 280, HDL 49, LDL 212, TG 109 -statin intolerant (liver enzyme elevation) 3. CAD -calcium score 2,498 (99th percentile)  Past Medical History: Past Medical History:  Diagnosis Date   Allergy    Anxiety    Arthritis    Asthma    Depression    Hx of colonic polyps    Hyperlipidemia    Hypertension    Osteoarthritis    Psoriatic arthritis ([place])     Past Surgical History: Past Surgical History:  Procedure Laterality Date   TUBAL LIGATION     WISDOM TOOTH EXTRACTION      Current Medications: Current Meds  Medication Sig   albuterol (VENTOLIN HFA) 108 (90 Base) MCG/ACT inhaler INHALE 2 PUFFS INTO THE LUNGS EVERY 4 HOURS AS NEEDED FOR COUGH, WHEEZING OR SHORTNESS OF BREATH   Alirocumab (PRALUENT) 75 MG/ML SOAJ Inject 75 mg into the skin every 14 (fourteen) days.   aspirin EC 81 MG tablet Take 1 tablet (81 mg total) by mouth daily.   Azelastine HCl 0.15 % SOLN USE 1 SPRAY IN THE NOSE TWICE DAILY   Azelastine-Fluticasone 137-50 MCG/ACT SUSP PLACE 1 SPRAY INTO THE NOSE 2 (TWO) TIMES DAILY AS NEEDED.   diclofenac sodium (VOLTAREN) 1 % GEL Apply 2 g  topically 4 (four) times daily.   fluticasone (FLOVENT HFA) 110 MCG/ACT inhaler Inhale 2 puffs into the lungs 2 (two) times daily.   hydrochlorothiazide (HYDRODIURIL) 25 MG tablet Take 1 tablet (25 mg total) by mouth daily.   methocarbamol (ROBAXIN) 500 MG tablet Take 1 tablet (500 mg total) by mouth at bedtime.     Allergies:    Paxil [paroxetine hcl], Bupropion hcl, Contrast media [iodinated diagnostic agents], Evac [psyllium], Lisinopril, and Tetracycline   Social History: Social History   Socioeconomic History   Marital status: Divorced    Spouse name: Not on file   Number of children: 2   Years of education: Not on file   Highest education level: Not on file  Occupational History   Not on file  Tobacco Use   Smoking status: Former    Years: 4.00    Types: Cigarettes    Smokeless tobacco: Never  Vaping Use   Vaping Use: Never used  Substance and Sexual Activity   Alcohol use: Yes    Alcohol/week: 3.0 standard drinks    Types: 3 Glasses of wine per week    Comment: rarely   Drug use: No   Sexual activity: Not Currently  Other Topics Concern   Not on file  Social History Narrative   Not on file   Social Determinants of Health   Financial Resource Strain: Not on file  Food Insecurity: Not on file  Transportation Needs: Not on file  Physical Activity: Not on file  Stress: Not on file  Social Connections: Not on file     Family History: The patient's family history includes Arthritis in an other family member; Depression in an other family member; Heart attack in her brother; Heart disease in her brother; Hyperlipidemia in her brother; Hypertension in her brother. There is no history of Colon cancer or Stomach cancer.  ROS:   All other ROS reviewed and negative. Pertinent positives noted in the HPI.    EKGs/Labs/Other Studies Reviewed:   The following studies were personally reviewed by me today:  EKG:  EKG is ordered today.  The ekg ordered today demonstrates normal sinus rhythm heart rate 68, T wave inversions noted in the inferior and anterior leads, and was personally reviewed by me.  Calcium score 07/30/2021 FINDINGS: CORONARY CALCIUM SCORES:   Left Main: 0   LAD: 1155   LCx: 98   RCA: 1245   Total Agatston Score: 2,498   MESA database percentile: 99   Recent Labs: 01/22/2021: BNP 16.2 02/27/2021: ALT 40; BUN 13; Creatinine, Ser 0.83; Hemoglobin 10.8; Magnesium 1.9; Platelets 178; Potassium 3.6; Sodium 143; TSH 3.800   Recent Lipid Panel    Component Value Date/Time   CHOL 280 (H) 05/27/2021 0000   TRIG 109 05/27/2021 0000   HDL 49 05/27/2021 0000   CHOLHDL 5.7 (H) 05/27/2021 0000   CHOLHDL 6 08/23/2018 1651   VLDL 29.8 08/23/2018 1651   LDLCALC 212 (H) 05/27/2021 0000   LDLDIRECT 188.6 07/10/2013 1626    Physical  Exam:   VS:  BP 110/70   Pulse 68   Ht 5\' 4"  (1.626 m)   Wt 197 lb 1.6 oz (89.4 kg)   LMP 04/02/2011   BMI 33.83 kg/m    Wt Readings from Last 3 Encounters:  10/02/21 197 lb 1.6 oz (89.4 kg)  08/19/21 211 lb 3.2 oz (95.8 kg)  07/02/21 207 lb 12.8 oz (94.3 kg)    General: Well nourished, well developed, in no acute distress  Head: Atraumatic, normal size  Eyes: PEERLA, EOMI  Neck: Supple, no JVD Endocrine: No thryomegaly Cardiac: Normal S1, S2; RRR; no murmurs, rubs, or gallops Lungs: Clear to auscultation bilaterally, no wheezing, rhonchi or rales  Abd: Soft, nontender, no hepatomegaly  Ext: No edema, pulses 2+ Musculoskeletal: No deformities, BUE and BLE strength normal and equal Skin: Warm and dry, no rashes   Neuro: Alert and oriented to person, place, time, and situation, CNII-XII grossly intact, no focal deficits  Psych: Normal mood and affect   ASSESSMENT:   LUZMARIA DEVAUX is a 70 y.o. female who presents for the following: 1. Coronary artery disease involving native coronary artery of native heart without angina pectoris   2. Agatston coronary artery calcium score greater than 400   3. Mixed hyperlipidemia     PLAN:   1. Coronary artery disease involving native coronary artery of native heart without angina pectoris 2. Agatston coronary artery calcium score greater than 400 3. Mixed hyperlipidemia -LDL cholesterol 212.  Taken off statin due to elevated liver enzymes.  She is now on PCSK9 inhibitor therapy.  She is been on this for 2 months.  She has severely elevated coronary calcium score up to 2498 which is the 99th percentile.  She has several family members who had elevated cholesterol as well as myocardial infarction.  These are both of her brothers.  I highly suspect she has familial hypercholesterolemia.  We discussed genetic testing.  She is interested.  This could have implications for her daughters as well as other family members who have very elevated  cholesterol.  Mainly for the younger family members.  I have recommended continue aspirin.  I have also recommended to continue PCSK9 inhibitor therapy.  Goal LDL should be less than 50.  She endorses no symptoms of angina or trouble breathing.  She is not that active.  She does have an EKG which is abnormal but she informs me this is chronic.  I would like to check a Lexi nuclear medicine stress test just to make sure she has no dangerous blockages.  Everything says we can likely treat her medically.  We will also check an echocardiogram given her abnormal EKG.  Again I suspect this to be normal but we will make sure.  She will see me back in 6 months.  I would like for her primary care physician to forward me the results of her cholesterol profile once they are obtained.  Shared Decision Making/Informed Consent The risks [chest pain, shortness of breath, cardiac arrhythmias, dizziness, blood pressure fluctuations, myocardial infarction, stroke/transient ischemic attack, nausea, vomiting, allergic reaction, radiation exposure, metallic taste sensation and life-threatening complications (estimated to be 1 in 10,000)], benefits (risk stratification, diagnosing coronary artery disease, treatment guidance) and alternatives of a nuclear stress test were discussed in detail with Ms. Forgione and she agrees to proceed.  Disposition: Return in about 6 months (around 04/02/2022).  Medication Adjustments/Labs and Tests Ordered: Current medicines are reviewed at length with the patient today.  Concerns regarding medicines are outlined above.  Orders Placed This Encounter  Procedures   MYOCARDIAL PERFUSION IMAGING   EKG 12-Lead   ECHOCARDIOGRAM COMPLETE   No orders of the defined types were placed in this encounter.   Patient Instructions  Medication Instructions:  The current medical regimen is effective;  continue present plan and medications.  *If you need a refill on your cardiac medications before your  next appointment, please call your pharmacy*   Lab Work: Have your  PCP send [place] blood work-   If you have labs (blood work) drawn today and your tests are completely normal, you will receive your results only by: [place] (if you have MyChart) OR A paper copy in the mail If you have any lab test that is abnormal or we need to change your treatment, we will call you to review the results.   Testing/Procedures: Your physician has requested that you have a lexiscan myoview. For further information please visit [email or web address]. Please follow instruction sheet, as given.  Echocardiogram - Your physician has requested that you have an echocardiogram. Echocardiography is a painless test that uses sound waves to create images of your heart. It provides your doctor with information about the size and shape of your heart and how well your heart's chambers and valves are working. This procedure takes approximately one hour. There are no restrictions for this procedure.     Follow-Up: At [facility], you and your health needs are our priority.  As part of our continuing mission to provide you with exceptional heart care, we have created designated Provider Care Teams.  These Care Teams include your primary Cardiologist (physician) and Advanced Practice Providers (APPs -  Physician Assistants and Nurse Practitioners) who all work together to provide you with the care you need, when you need it.  We recommend signing up for the patient portal called "MyChart".  Sign up information is provided on this After Visit Summary.  MyChart is used to connect with patients for Virtual Visits (Telemedicine).  Patients are able to view lab/test results, encounter notes, upcoming appointments, etc.  Non-urgent messages can be sent to your provider as well.   To learn more about what you can do with MyChart, go to [email or web address].    Your next appointment:   6 month(s)  The format for your next  appointment:   In Person  Provider:   Eleonore Chiquito, MD   Other Instructions Genetic testing will be ordered- and mailed to you.    Signed, Addison Naegeli. Audie Box, MD, [place]  [address], [place], [place] [address] [phone]  10/02/2021 10:07 AM

## 2021-10-02 ENCOUNTER — Ambulatory Visit (INDEPENDENT_AMBULATORY_CARE_PROVIDER_SITE_OTHER): Payer: Medicare Other | Admitting: Cardiovascular Disease

## 2021-10-02 ENCOUNTER — Other Ambulatory Visit: Payer: Self-pay

## 2021-10-02 ENCOUNTER — Encounter (HOSPITAL_BASED_OUTPATIENT_CLINIC_OR_DEPARTMENT_OTHER): Payer: Self-pay | Admitting: Cardiovascular Disease

## 2021-10-02 VITALS — BP 110/70 | HR 68 | Ht 64.0 in | Wt 197.1 lb

## 2021-10-02 DIAGNOSIS — I2510 Atherosclerotic heart disease of native coronary artery without angina pectoris: Secondary | ICD-10-CM | POA: Diagnosis not present

## 2021-10-02 DIAGNOSIS — E782 Mixed hyperlipidemia: Secondary | ICD-10-CM

## 2021-10-02 DIAGNOSIS — R931 Abnormal findings on diagnostic imaging of heart and coronary circulation: Secondary | ICD-10-CM | POA: Diagnosis not present

## 2021-10-02 NOTE — Patient Instructions (Signed)
Medication Instructions:  The current medical regimen is effective;  continue present plan and medications.  *If you need a refill on your cardiac medications before your next appointment, please call your pharmacy*   Lab Work: Have your PCP send [place] blood work-   If you have labs (blood work) drawn today and your tests are completely normal, you will receive your results only by: [place] (if you have MyChart) OR A paper copy in the mail If you have any lab test that is abnormal or we need to change your treatment, we will call you to review the results.   Testing/Procedures: Your physician has requested that you have a lexiscan myoview. For further information please visit [email or web address]. Please follow instruction sheet, as given.  Echocardiogram - Your physician has requested that you have an echocardiogram. Echocardiography is a painless test that uses sound waves to create images of your heart. It provides your doctor with information about the size and shape of your heart and how well your heart's chambers and valves are working. This procedure takes approximately one hour. There are no restrictions for this procedure.     Follow-Up: At [facility], you and your health needs are our priority.  As part of our continuing mission to provide you with exceptional heart care, we have created designated Provider Care Teams.  These Care Teams include your primary Cardiologist (physician) and Advanced Practice Providers (APPs -  Physician Assistants and Nurse Practitioners) who all work together to provide you with the care you need, when you need it.  We recommend signing up for the patient portal called "MyChart".  Sign up information is provided on this After Visit Summary.  MyChart is used to connect with patients for Virtual Visits (Telemedicine).  Patients are able to view lab/test results, encounter notes, upcoming appointments, etc.  Non-urgent messages can be sent to your  provider as well.   To learn more about what you can do with MyChart, go to [email or web address].    Your next appointment:   6 month(s)  The format for your next appointment:   In Person  Provider:   Eleonore Chiquito, MD   Other Instructions Genetic testing will be ordered- and mailed to you.

## 2021-10-03 ENCOUNTER — Ambulatory Visit (INDEPENDENT_AMBULATORY_CARE_PROVIDER_SITE_OTHER): Payer: Medicare Other | Admitting: Medical

## 2021-10-03 VITALS — BP 120/78 | HR 79 | Wt 198.6 lb

## 2021-10-03 DIAGNOSIS — R9431 Abnormal electrocardiogram [ECG] [EKG]: Secondary | ICD-10-CM | POA: Diagnosis not present

## 2021-10-03 DIAGNOSIS — R748 Abnormal levels of other serum enzymes: Secondary | ICD-10-CM

## 2021-10-03 DIAGNOSIS — Z6834 Body mass index (BMI) 34.0-34.9, adult: Secondary | ICD-10-CM | POA: Diagnosis not present

## 2021-10-03 DIAGNOSIS — I2510 Atherosclerotic heart disease of native coronary artery without angina pectoris: Secondary | ICD-10-CM

## 2021-10-03 DIAGNOSIS — I10 Essential (primary) hypertension: Secondary | ICD-10-CM | POA: Diagnosis not present

## 2021-10-03 DIAGNOSIS — E785 Hyperlipidemia, unspecified: Secondary | ICD-10-CM | POA: Diagnosis not present

## 2021-10-03 DIAGNOSIS — L853 Xerosis cutis: Secondary | ICD-10-CM | POA: Diagnosis not present

## 2021-10-03 DIAGNOSIS — R799 Abnormal finding of blood chemistry, unspecified: Secondary | ICD-10-CM | POA: Diagnosis not present

## 2021-10-03 DIAGNOSIS — R21 Rash and other nonspecific skin eruption: Secondary | ICD-10-CM | POA: Diagnosis not present

## 2021-10-03 DIAGNOSIS — Z131 Encounter for screening for diabetes mellitus: Secondary | ICD-10-CM | POA: Insufficient documentation

## 2021-10-03 LAB — HEMOGLOBIN A1C
Est. average glucose Bld gHb Est-mCnc: 120 mg/dL
Hgb A1c MFr Bld: 5.8 % — ABNORMAL HIGH (ref 4.8–5.6)

## 2021-10-03 MED ORDER — HYDROCHLOROTHIAZIDE 25 MG PO TABS: 25.0000 mg | ORAL_TABLET | Freq: Every day | ORAL | 3 refills | Status: DC

## 2021-10-03 MED ORDER — QSYMIA 7.5-46 MG PO CP24: 1.0000 | ORAL_CAPSULE | ORAL | 1 refills | Status: DC

## 2021-10-03 MED ORDER — TERBINAFINE HCL 1 % EX CREA: 1.0000 "application " | TOPICAL_CREAM | Freq: Two times a day (BID) | CUTANEOUS | 0 refills | Status: DC

## 2021-10-03 NOTE — Patient Instructions (Addendum)
Dry skin and flaking on legs Drink plenty of water daily Consider using daily moisturizing lotion such as Lubriderm or Aquphor or Cetaphil  We will call back with lab results.       Sample diet recommendations:  Breakfast You may eat 1 of the following Smoothie with Almond milk, handful of kale or spinach, and 1-2 fruit servings of your choice such as berries or 1/2 banana Whole grain slice of toast and thin layer of low sugar jam or small amount of honey Whole grain slice of toast and avocado spread 1/2 cup of steel cut oats (oatmeal)   Mid-morning snack 1 fruit serving such as one of the following: medium-sized apple medium-sized orange, Tangerine 1/2 banana  3/4 cup of fresh berries or frozen berries  A protein source such as one of the following: 8 almonds  small handful of walnuts or other nuts Hummus and vegetable such as carrots   Lunch A protein source such as 1 of the following: 1 serving of beans such as black beans, pinto beans, green beans, or edamame (soy beans) Veggie burger  Non breaded fish such as salmon or tuna, either baked, grilled, or broiled Vegetable - Half of your plate should be a non-starchy vegetables!  So avoid white potatoes and corn.  Otherwise, eat a large portion of vegetables. Avocado, cucumber, tomato, carrots, greens, lettuce, squash, okra, etc.  Vegetables can include salad with olive oil/vinaigrette dressing Grains such as 1/2 cup of brown rice, quinoa, barley or other whole grain or 1 or 2 slices of whole grain bread   Mid-afternoon snack 1 fruit serving such as one of the following: medium-sized apple medium-sized orange, Tangerine 1/2 banana  3/4 cup of fresh berries or frozen berries  A protein source such as one of the following: 8 almonds  small handful of walnuts or other nuts Hummus and vegetable such as carrots   Dinner A protein source such as 1 of the following: 1 serving of beans such as black beans, pinto  beans, green beans, or edamame (soy beans) Veggie burger  Non breaded fish such as salmon or tuna, either baked, grilled, or broiled Vegetable - Half of your plate should be a non-starchy vegetables!  So avoid white potatoes and corn.  Otherwise, eat a large portion of vegetables. Avocado, cucumber, tomato, carrots, greens, lettuce, squash, okra, etc.  Vegetables can include salad with olive oil/vinaigrette dressing Grains such as 1/2 cup of brown rice, quinoa, barley or other whole grain or 1 or 2 slices of whole grain bread   Beverages: Water Unsweet tea Home made juice with a juicer without sugar added other than small bit of honey or agave nectar Water with sugar free flavor such as [place]   AVOID.... For the time being I want you to cut out the following items completely: Soda, sweet tea, juice, beer or wine or alcohol Sweets such as cake, candy, pies, chips, cookies, chocolate

## 2021-10-03 NOTE — Progress Notes (Signed)
Subjective:  Paula Meyer is a 70 y.o. female who presents for Chief Complaint  Patient presents with   Follow-up    Follow-up on cholesterol   Medical team: Dr. Latanya Maudlin, ortho Dr. Levy Pupa, spine/ortho Dr. Suella Broad, ortho Dr. Lahoma Rocker, [facility] Dr. Fransico Michael, cardiology   Concerns: Here for fu on lipids.  She was having elevated LFts on statin.   We changed about 2 months ago to [employer].  She is tolerating this good so far although some of the pen devices acted up.  Has had 4 injections so far.  No side effects reported.   She is fasting today  Saw cardiology yesterday.  Has stress test and echo coming up soon.  Has some rash on arms and in upper inner thighs x few weeks, itchy.  Has had dry flaky skin on her legs in particular  She wants help with losing weight..  She has tried weight watchers before but did not like that.  She declines healthy weight and wellness clinic referral.  She wants to try working with me with dietary changes exercise such as walking and medication.  No other aggravating or relieving factors.    No other c/o.  Past Medical History:  Diagnosis Date   Allergy    Anxiety    Arthritis    Asthma    Depression    Hx of colonic polyps    Hyperlipidemia    Hypertension    Osteoarthritis    Psoriatic arthritis ([place])    Current Outpatient Medications on File Prior to Visit  Medication Sig Dispense Refill   albuterol (VENTOLIN HFA) 108 (90 Base) MCG/ACT inhaler INHALE 2 PUFFS INTO THE LUNGS EVERY 4 HOURS AS NEEDED FOR COUGH, WHEEZING OR SHORTNESS OF BREATH 6.7 g 1   Alirocumab (PRALUENT) 75 MG/ML SOAJ Inject 75 mg into the skin every 14 (fourteen) days. 2 mL 5   aspirin EC 81 MG tablet Take 1 tablet (81 mg total) by mouth daily. 90 tablet 1   Azelastine HCl 0.15 % SOLN USE 1 SPRAY IN THE NOSE TWICE DAILY 90 mL 1   Azelastine-Fluticasone 137-50 MCG/ACT SUSP PLACE 1 SPRAY INTO THE NOSE 2 (TWO) TIMES  DAILY AS NEEDED. 23 g 1   diclofenac sodium (VOLTAREN) 1 % GEL Apply 2 g topically 4 (four) times daily. 100 g 1   fluticasone (FLOVENT HFA) 110 MCG/ACT inhaler Inhale 2 puffs into the lungs 2 (two) times daily. 1 Inhaler 5   methocarbamol (ROBAXIN) 500 MG tablet Take 1 tablet (500 mg total) by mouth at bedtime. (Patient not taking: Reported on 10/03/2021) 30 tablet 1   No current facility-administered medications on file prior to visit.     The following portions of the patient's history were reviewed and updated as appropriate: allergies, current medications, past family history, past medical history, past social history, past surgical history and problem list.  ROS Otherwise as in subjective above  Objective: BP 120/78   Pulse 79   Wt 198 lb 9.6 oz (90.1 kg)   LMP 04/02/2011   BMI 34.09 kg/m   Wt Readings from Last 3 Encounters:  10/03/21 198 lb 9.6 oz (90.1 kg)  10/02/21 197 lb 1.6 oz (89.4 kg)  08/19/21 211 lb 3.2 oz (95.8 kg)   BP Readings from Last 3 Encounters:  10/03/21 120/78  10/02/21 110/70  08/19/21 120/70     General appearance: alert, no distress, well developed, well nourished Bilateral lower legs with dry  flaky skin, there is a somewhat roundish rough skin rash on the left forearm similar rash on the right antecubital region.  She also notes similar rash in the upper inner thighs intertrigo in this region but declines exam on that. Neck: supple, no lymphadenopathy, no thyromegaly, no masses, no JVD or bruit Heart: RRR, normal S1, S2, no murmurs Lungs: CTA bilaterally, no wheezes, rhonchi, or rales Pulses: 2+ radial pulses, 1+ pedal pulses, normal cap refill Ext: no edema   Assessment: Encounter Diagnoses  Name Primary?   Hyperlipidemia with target LDL less than 130 Yes   Screening for diabetes mellitus    Essential hypertension, benign    Elevated liver enzymes    Abnormal finding of blood chemistry, unspecified     Dry skin    Abnormal EKG    Rash     BMI 34.0-34.9,adult      Plan: Hyperlipidemia-we switched her statin to Praluent about 2 months ago given elevated liver test.  She is tolerating Praluent well.  She is having some issues with the pen device but overall doing okay with this.  Updated labs today.  Goal LDL less than 50  Abnormal EKG-she saw cardiology yesterday and has upcoming tests.  I reviewed cardiology notes  Hypertension-continue same medication  Elevated liver test thought to be due to statin drug.  Recheck liver test today  Dry skin-we discussed lotion, moisturizing lotion, good hydration  Rash, likely tinea-begin Lamisil for the next 1 to 2 weeks until resolved  Obesity-work on efforts through healthy diet and exercise and she is willing to begin medication below to help.  [name] was seen today for follow-up.  Diagnoses and all orders for this visit:  Hyperlipidemia with target LDL less than 130 -     Hepatic function panel -     Lipid panel  Screening for diabetes mellitus -     Hemoglobin A1c  Essential hypertension, benign -     hydrochlorothiazide (HYDRODIURIL) 25 MG tablet; Take 1 tablet (25 mg total) by mouth daily.  Elevated liver enzymes -     Hepatic function panel  Abnormal finding of blood chemistry, unspecified  -     Hemoglobin A1c  Dry skin  Abnormal EKG  Rash  BMI 34.0-34.9,adult  Other orders -     terbinafine (LAMISIL AT) 1 % cream; Apply 1 application topically 2 (two) times daily. -     Phentermine-Topiramate (QSYMIA) 7.5-46 MG CP24; Take 1 capsule by mouth every morning.   Follow up: pending labs

## 2021-10-04 ENCOUNTER — Telehealth: Payer: Self-pay

## 2021-10-04 LAB — HEPATIC FUNCTION PANEL
ALT: 20 IU/L (ref 0–32)
AST: 24 IU/L (ref 0–40)
Albumin: 4.3 g/dL (ref 3.8–4.8)
Alkaline Phosphatase: 53 IU/L (ref 44–121)
Bilirubin Total: 0.5 mg/dL (ref 0.0–1.2)
Bilirubin, Direct: 0.12 mg/dL (ref 0.00–0.40)
Total Protein: 6.9 g/dL (ref 6.0–8.5)

## 2021-10-04 LAB — LIPID PANEL
Chol/HDL Ratio: 4 ratio (ref 0.0–4.4)
Cholesterol, Total: 198 mg/dL (ref 100–199)
HDL: 49 mg/dL (ref >39)
LDL Chol Calc (NIH): 135 mg/dL — ABNORMAL HIGH (ref 0–99)
Triglycerides: 76 mg/dL (ref 0–149)
VLDL Cholesterol Cal: 14 mg/dL (ref 5–40)

## 2021-10-04 NOTE — Telephone Encounter (Signed)
P.A. QSYMIA 

## 2021-10-06 DIAGNOSIS — E785 Hyperlipidemia, unspecified: Secondary | ICD-10-CM | POA: Diagnosis not present

## 2021-10-07 ENCOUNTER — Other Ambulatory Visit: Payer: Self-pay | Admitting: Medical

## 2021-10-07 MED ORDER — ATORVASTATIN CALCIUM 20 MG PO TABS: 20.0000 mg | ORAL_TABLET | Freq: Every day | ORAL | 11 refills | Status: DC

## 2021-10-13 NOTE — Telephone Encounter (Signed)
P.A. approved til 01/07/22 activated discount card and faxed to pharmacy.  Called pt and informed

## 2021-10-13 NOTE — Telephone Encounter (Signed)
PA approved and pt informed.  

## 2021-10-14 DIAGNOSIS — M1990 Unspecified osteoarthritis, unspecified site: Secondary | ICD-10-CM | POA: Diagnosis not present

## 2021-10-14 DIAGNOSIS — M549 Dorsalgia, unspecified: Secondary | ICD-10-CM | POA: Diagnosis not present

## 2021-10-14 DIAGNOSIS — L409 Psoriasis, unspecified: Secondary | ICD-10-CM | POA: Diagnosis not present

## 2021-10-14 DIAGNOSIS — R768 Other specified abnormal immunological findings in serum: Secondary | ICD-10-CM | POA: Diagnosis not present

## 2021-10-14 DIAGNOSIS — M79643 Pain in unspecified hand: Secondary | ICD-10-CM | POA: Diagnosis not present

## 2021-10-14 DIAGNOSIS — Z79899 Other long term (current) drug therapy: Secondary | ICD-10-CM | POA: Diagnosis not present

## 2021-10-14 DIAGNOSIS — M25572 Pain in left ankle and joints of left foot: Secondary | ICD-10-CM | POA: Diagnosis not present

## 2021-10-14 DIAGNOSIS — L4050 Arthropathic psoriasis, unspecified: Secondary | ICD-10-CM | POA: Diagnosis not present

## 2021-10-16 ENCOUNTER — Telehealth (HOSPITAL_COMMUNITY): Payer: Self-pay

## 2021-10-16 MED ORDER — ALBUTEROL SULFATE HFA 108 (90 BASE) MCG/ACT IN AERS: INHALATION_SPRAY | RESPIRATORY_TRACT | 0 refills | Status: DC

## 2021-10-16 NOTE — Telephone Encounter (Signed)
Attempted to contact the patient, but her mailbox was full. Will try again later. S.Aragon Scarantino EMTP

## 2021-10-21 ENCOUNTER — Ambulatory Visit (HOSPITAL_COMMUNITY): Payer: Medicare Other | Attending: Cardiology

## 2021-10-21 ENCOUNTER — Ambulatory Visit (HOSPITAL_BASED_OUTPATIENT_CLINIC_OR_DEPARTMENT_OTHER): Payer: Medicare Other

## 2021-10-21 ENCOUNTER — Other Ambulatory Visit: Payer: Self-pay

## 2021-10-21 DIAGNOSIS — I2510 Atherosclerotic heart disease of native coronary artery without angina pectoris: Secondary | ICD-10-CM | POA: Diagnosis not present

## 2021-10-21 LAB — ECHOCARDIOGRAM COMPLETE
Area-P 1/2: 3.21 cm2
Height: 64 in
S' Lateral: 3 cm
Weight: 3152 oz

## 2021-10-21 LAB — MYOCARDIAL PERFUSION IMAGING
Base ST Depression (mm): 0 mm
LV dias vol: 77 mL (ref 46–106)
LV sys vol: 25 mL
Nuc Stress EF: 67 %
Rest Nuclear Isotope Dose: 8.4 mCi
SDS: 4
SRS: 0
SSS: 4
ST Depression (mm): 0 mm
Stress Nuclear Isotope Dose: 27 mCi
TID: 1.1

## 2021-10-21 MED ORDER — REGADENOSON 0.4 MG/5ML IV SOLN
0.4000 mg | Freq: Once | INTRAVENOUS | Status: AC
  Administered 2021-10-21: 0.4 mg via INTRAVENOUS

## 2021-10-21 MED ORDER — TECHNETIUM TC 99M TETROFOSMIN IV KIT
27.0000 | PACK | Freq: Once | INTRAVENOUS | Status: AC | PRN
  Administered 2021-10-21: 27 via INTRAVENOUS
  Filled 2021-10-21: qty 27

## 2021-10-21 MED ORDER — TECHNETIUM TC 99M TETROFOSMIN IV KIT
8.4000 | PACK | Freq: Once | INTRAVENOUS | Status: AC | PRN
  Administered 2021-10-21: 8.4 via INTRAVENOUS
  Filled 2021-10-21: qty 9

## 2021-10-24 ENCOUNTER — Ambulatory Visit (INDEPENDENT_AMBULATORY_CARE_PROVIDER_SITE_OTHER): Payer: Medicare Other

## 2021-10-24 VITALS — Ht 65.0 in | Wt 191.0 lb

## 2021-10-24 DIAGNOSIS — Z0000 Encounter for general adult medical examination without abnormal findings: Secondary | ICD-10-CM

## 2021-10-24 NOTE — Patient Instructions (Signed)
Paula Meyer , Thank you for taking time to come for your Medicare Wellness Visit. I appreciate your ongoing commitment to your health goals. Please review the following plan we discussed and let me know if I can assist you in the future.   Screening recommendations/referrals: Colonoscopy: completed 10/26/2017, due 10/26/2022 Mammogram: scheduled for 10/30/2021 Bone Density: completed 07/07/2019 Recommended yearly ophthalmology/optometry visit for glaucoma screening and checkup Recommended yearly dental visit for hygiene and checkup  Vaccinations: Influenza vaccine: decline Pneumococcal vaccine: completed 08/23/2018 Tdap vaccine:  due Shingles vaccine: discussed   Covid-19: 02/28/2020, 02/04/2020  Advanced directives: Please bring a copy of your POA (Power of Attorney) and/or Living Will to your next appointment.   Conditions/risks identified: none  Next appointment: Follow up in one year for your annual wellness visit    Preventive Care 65 Years and Older, Female Preventive care refers to lifestyle choices and visits with your health care provider that can promote health and wellness. What does preventive care include? A yearly physical exam. This is also called an annual well check. Dental exams once or twice a year. Routine eye exams. Ask your health care provider how often you should have your eyes checked. Personal lifestyle choices, including: Daily care of your teeth and gums. Regular physical activity. Eating a healthy diet. Avoiding tobacco and drug use. Limiting alcohol use. Practicing safe sex. Taking low-dose aspirin every day. Taking vitamin and mineral supplements as recommended by your health care provider. What happens during an annual well check? The services and screenings done by your health care provider during your annual well check will depend on your age, overall health, lifestyle risk factors, and family history of disease. Counseling  Your health care  provider may ask you questions about your: Alcohol use. Tobacco use. Drug use. Emotional well-being. Home and relationship well-being. Sexual activity. Eating habits. History of falls. Memory and ability to understand (cognition). Work and work Statistician. Reproductive health. Screening  You may have the following tests or measurements: Height, weight, and BMI. Blood pressure. Lipid and cholesterol levels. These may be checked every 5 years, or more frequently if you are over 18 years old. Skin check. Lung cancer screening. You may have this screening every year starting at age 36 if you have a 30-pack-year history of smoking and currently smoke or have quit within the past 15 years. Fecal occult blood test (FOBT) of the stool. You may have this test every year starting at age 54. Flexible sigmoidoscopy or colonoscopy. You may have a sigmoidoscopy every 5 years or a colonoscopy every 10 years starting at age 38. Hepatitis C blood test. Hepatitis B blood test. Sexually transmitted disease (STD) testing. Diabetes screening. This is done by checking your blood sugar (glucose) after you have not eaten for a while (fasting). You may have this done every 1-3 years. Bone density scan. This is done to screen for osteoporosis. You may have this done starting at age 63. Mammogram. This may be done every 1-2 years. Talk to your health care provider about how often you should have regular mammograms. Talk with your health care provider about your test results, treatment options, and if necessary, the need for more tests. Vaccines  Your health care provider may recommend certain vaccines, such as: Influenza vaccine. This is recommended every year. Tetanus, diphtheria, and acellular pertussis (Tdap, Td) vaccine. You may need a Td booster every 10 years. Zoster vaccine. You may need this after age 48. Pneumococcal 13-valent conjugate (PCV13) vaccine. One dose is  recommended after age  76. Pneumococcal polysaccharide (PPSV23) vaccine. One dose is recommended after age 10. Talk to your health care provider about which screenings and vaccines you need and how often you need them. This information is not intended to replace advice given to you by your health care provider. Make sure you discuss any questions you have with your health care provider. Document Released: 12/27/2015 Document Revised: 08/19/2016 Document Reviewed: 10/01/2015 Elsevier Interactive Patient Education  2017 [employer] Prevention in the Home Falls can cause injuries. They can happen to people of all ages. There are many things you can do to make your home safe and to help prevent falls. What can I do on the outside of my home? Regularly fix the edges of walkways and driveways and fix any cracks. Remove anything that might make you trip as you walk through a door, such as a raised step or threshold. Trim any bushes or trees on the path to your home. Use bright outdoor lighting. Clear any walking paths of anything that might make someone trip, such as rocks or tools. Regularly check to see if handrails are loose or broken. Make sure that both sides of any steps have handrails. Any raised decks and porches should have guardrails on the edges. Have any leaves, snow, or ice cleared regularly. Use sand or salt on walking paths during winter. Clean up any spills in your garage right away. This includes oil or grease spills. What can I do in the bathroom? Use night lights. Install grab bars by the toilet and in the tub and shower. Do not use towel bars as grab bars. Use non-skid mats or decals in the tub or shower. If you need to sit down in the shower, use a plastic, non-slip stool. Keep the floor dry. Clean up any water that spills on the floor as soon as it happens. Remove soap buildup in the tub or shower regularly. Attach bath mats securely with double-sided non-slip rug tape. Do not have throw  rugs and other things on the floor that can make you trip. What can I do in the bedroom? Use night lights. Make sure that you have a light by your bed that is easy to reach. Do not use any sheets or blankets that are too big for your bed. They should not hang down onto the floor. Have a firm chair that has side arms. You can use this for support while you get dressed. Do not have throw rugs and other things on the floor that can make you trip. What can I do in the kitchen? Clean up any spills right away. Avoid walking on wet floors. Keep items that you use a lot in easy-to-reach places. If you need to reach something above you, use a strong step stool that has a grab bar. Keep electrical cords out of the way. Do not use floor polish or wax that makes floors slippery. If you must use wax, use non-skid floor wax. Do not have throw rugs and other things on the floor that can make you trip. What can I do with my stairs? Do not leave any items on the stairs. Make sure that there are handrails on both sides of the stairs and use them. Fix handrails that are broken or loose. Make sure that handrails are as long as the stairways. Check any carpeting to make sure that it is firmly attached to the stairs. Fix any carpet that is loose or worn. Avoid having  throw rugs at the top or bottom of the stairs. If you do have throw rugs, attach them to the floor with carpet tape. Make sure that you have a light switch at the top of the stairs and the bottom of the stairs. If you do not have them, ask someone to add them for you. What else can I do to help prevent falls? Wear shoes that: Do not have high heels. Have rubber bottoms. Are comfortable and fit you well. Are closed at the toe. Do not wear sandals. If you use a stepladder: Make sure that it is fully opened. Do not climb a closed stepladder. Make sure that both sides of the stepladder are locked into place. Ask someone to hold it for you, if  possible. Clearly mark and make sure that you can see: Any grab bars or handrails. First and last steps. Where the edge of each step is. Use tools that help you move around (mobility aids) if they are needed. These include: Canes. Walkers. Scooters. Crutches. Turn on the lights when you go into a dark area. Replace any light bulbs as soon as they burn out. Set up your furniture so you have a clear path. Avoid moving your furniture around. If any of your floors are uneven, fix them. If there are any pets around you, be aware of where they are. Review your medicines with your doctor. Some medicines can make you feel dizzy. This can increase your chance of falling. Ask your doctor what other things that you can do to help prevent falls. This information is not intended to replace advice given to you by your health care provider. Make sure you discuss any questions you have with your health care provider. Document Released: 09/26/2009 Document Revised: 05/07/2016 Document Reviewed: 01/04/2015 Elsevier Interactive Patient Education  2017 [employer].

## 2021-10-24 NOTE — Progress Notes (Signed)
I connected with Paula Meyer today by telephone and verified that I am speaking with the correct person using two identifiers. Location patient: home Location provider: work Persons participating in the virtual visit: Anitha, Kreiser LPN.   I discussed the limitations, risks, security and privacy concerns of performing an evaluation and management service by telephone and the availability of in person appointments. I also discussed with the patient that there may be a patient responsible charge related to this service. The patient expressed understanding and verbally consented to this telephonic visit.    Interactive audio and video telecommunications were attempted between this provider and patient, however failed, due to patient having technical difficulties OR patient did not have access to video capability.  We continued and completed visit with audio only.     Vital signs may be patient reported or missing.  Subjective:   Paula Meyer is a 70 y.o. female who presents for Medicare Annual (Subsequent) preventive examination.  Review of Systems     Cardiac Risk Factors include: advanced age (>80men, >67 women);dyslipidemia;hypertension;obesity (BMI >30kg/m2);sedentary lifestyle     Objective:    Today's Vitals   10/24/21 1411 10/24/21 1412  Weight: 191 lb (86.6 kg)   Height: 5\' 5"  (1.651 m)   PainSc:  4    Body mass index is 31.78 kg/m.  Advanced Directives 10/24/2021 02/29/2020 06/15/2016  Does Patient Have a Medical Advance Directive? Yes No No  Type of Advance Directive [place] in Chart? No - copy requested - -  Would patient like information on creating a medical advance directive? - Yes (ED - Information included in AVS) No - patient declined information    Current Medications (verified) Outpatient Encounter Medications as of 10/24/2021  Medication Sig   albuterol (VENTOLIN HFA) 108 (90  Base) MCG/ACT inhaler INHALE 2 PUFFS INTO THE LUNGS EVERY 4 HOURS AS NEEDED FOR COUGH, WHEEZING OR SHORTNESS OF BREATH   Alirocumab (PRALUENT) 75 MG/ML SOAJ Inject 75 mg into the skin every 14 (fourteen) days.   aspirin EC 81 MG tablet Take 1 tablet (81 mg total) by mouth daily.   atorvastatin (LIPITOR) 20 MG tablet Take 1 tablet (20 mg total) by mouth daily.   Azelastine HCl 0.15 % SOLN USE 1 SPRAY IN THE NOSE TWICE DAILY   Azelastine-Fluticasone 137-50 MCG/ACT SUSP PLACE 1 SPRAY INTO THE NOSE 2 (TWO) TIMES DAILY AS NEEDED.   diclofenac sodium (VOLTAREN) 1 % GEL Apply 2 g topically 4 (four) times daily.   fluticasone (FLOVENT HFA) 110 MCG/ACT inhaler Inhale 2 puffs into the lungs 2 (two) times daily.   golimumab (SIMPONI ARIA) 50 MG/4ML SOLN injection    hydrochlorothiazide (HYDRODIURIL) 25 MG tablet Take 1 tablet (25 mg total) by mouth daily.   methocarbamol (ROBAXIN) 500 MG tablet Take 1 tablet (500 mg total) by mouth at bedtime.   Phentermine-Topiramate (QSYMIA) 7.5-46 MG CP24 Take 1 capsule by mouth every morning.   terbinafine (LAMISIL AT) 1 % cream Apply 1 application topically 2 (two) times daily.   No facility-administered encounter medications on file as of 10/24/2021.    Allergies (verified) Paxil [paroxetine hcl], Bupropion hcl, Contrast media [iodinated diagnostic agents], Evac [psyllium], Lisinopril, and Tetracycline   History: Past Medical History:  Diagnosis Date   Allergy    Anxiety    Arthritis    Asthma    Depression    Hx of colonic polyps    Hyperlipidemia  Hypertension    Osteoarthritis    Psoriatic arthritis ([place])    Past Surgical History:  Procedure Laterality Date   TUBAL LIGATION     WISDOM TOOTH EXTRACTION     Family History  Problem Relation Age of Onset   Heart attack Brother    Hyperlipidemia Brother    Hypertension Brother    Heart disease Brother    Arthritis Other    Depression Other    Colon cancer Neg Hx    Stomach cancer Neg Hx     Social History   Socioeconomic History   Marital status: Divorced    Spouse name: Not on file   Number of children: 2   Years of education: Not on file   Highest education level: Not on file  Occupational History   Not on file  Tobacco Use   Smoking status: Former    Years: 4.00    Types: Cigarettes   Smokeless tobacco: Never  Vaping Use   Vaping Use: Never used  Substance and Sexual Activity   Alcohol use: Yes    Alcohol/week: 3.0 standard drinks    Types: 3 Glasses of wine per week    Comment: rarely   Drug use: No   Sexual activity: Not Currently  Other Topics Concern   Not on file  Social History Narrative   Not on file   Social Determinants of Health   Financial Resource Strain: Low Risk    Difficulty of Paying Living Expenses: Not hard at all  Food Insecurity: No Food Insecurity   Worried About Charity fundraiser in the Last Year: Never true   [place] in the Last Year: Never true  Transportation Needs: No Transportation Needs   Lack of Transportation (Medical): No   Lack of Transportation (Non-Medical): No  Physical Activity: Inactive   Days of Exercise per Week: 0 days   Minutes of Exercise per Session: 0 min  Stress: No Stress Concern Present   Feeling of Stress : Only a little  Social Connections: Not on file    Tobacco Counseling Counseling given: Not Answered   Clinical Intake:  Pre-visit preparation completed: Yes  Pain : 0-10 Pain Score: 4  Pain Type: Chronic pain Pain Location: Leg Pain Orientation: Right Pain Descriptors / Indicators: Dull, Aching Pain Onset: More than a month ago Pain Frequency: Constant     Nutritional Status: BMI > 30  Obese Nutritional Risks: None Diabetes: No  How often do you need to have someone help you when you read instructions, pamphlets, or other written materials from your doctor or pharmacy?: 1 - Never What is the last grade level you completed in school?: two degrees  Diabetic?  no  Interpreter Needed?: No  Information entered by :: NAllen LPN   Activities of Daily Living In your present state of health, do you have any difficulty performing the following activities: 10/24/2021 05/27/2021  Hearing? N N  Vision? N N  Difficulty concentrating or making decisions? N N  Walking or climbing stairs? N Y  Dressing or bathing? N N  Doing errands, shopping? N N  Preparing Food and eating ? N -  Using the Toilet? N -  In the past six months, have you accidently leaked urine? N -  Do you have problems with loss of bowel control? N -  Managing your Medications? N -  Managing your Finances? N -  Housekeeping or managing your Housekeeping? N -  Some recent data might  be hidden    Patient Care Team: Tysinger, Leward Quan as PCP - General (Family Medicine)  Indicate any recent Medical Services you may have received from other than [facility] providers in the past year (date may be approximate).     Assessment:   This is a routine wellness examination for Paula Meyer.  Hearing/Vision screen Vision Screening - Comments:: No regular eye exams,   Dietary issues and exercise activities discussed: Current Exercise Habits: The patient does not participate in regular exercise at present   Goals Addressed             This Visit's Progress    Patient Stated       10/24/2021, want to weigh 160 pounds       Depression Screen PHQ 2/9 Scores 10/24/2021 08/19/2021 02/25/2021 01/22/2021 11/28/2020 07/25/2020 06/21/2020  PHQ - 2 Score 0 1 3 0 0 0 0  PHQ- 9 Score - - 13 - - - -    Fall Risk Fall Risk  10/24/2021 08/19/2021 01/22/2021 11/28/2020 07/25/2020  Falls in the past year? 0 1 0 0 0  Number falls in past yr: - 0 0 0 0  Injury with Fall? - 0 0 0 0  Risk for fall due to : Medication side effect No Fall Risks - - -  Follow up Falls evaluation completed;Education provided;Falls prevention discussed Falls evaluation completed Falls evaluation completed Falls evaluation completed  Falls evaluation completed    FALL RISK PREVENTION PERTAINING TO THE HOME:  Any stairs in or around the home? Yes  If so, are there any without handrails? No  Home free of loose throw rugs in walkways, pet beds, electrical cords, etc? Yes  Adequate lighting in your home to reduce risk of falls? Yes   ASSISTIVE DEVICES UTILIZED TO PREVENT FALLS:  Life alert? No  Use of a cane, walker or w/c? No  Grab bars in the bathroom? No  Shower chair or bench in shower? Yes  Elevated toilet seat or a handicapped toilet? Yes   TIMED UP AND GO:  Was the test performed? No .      Cognitive Function:     6CIT Screen 10/24/2021 02/29/2020  What Year? 0 points 0 points  What month? 0 points 0 points  What time? 0 points 0 points  Count back from 20 0 points 0 points  Months in reverse 0 points 0 points  Repeat phrase 0 points 0 points  Total Score 0 0    Immunizations Immunization History  Administered Date(s) Administered   Influenza, High Dose Seasonal PF 01/12/2019   PFIZER(Purple Top)SARS-COV-2 Vaccination 02/04/2020, 02/28/2020   Pneumococcal Conjugate-13 08/23/2018   Pneumococcal Polysaccharide-23 07/10/2013   Tdap 04/02/2011    TDAP status: Due, Education has been provided regarding the importance of this vaccine. Advised may receive this vaccine at local pharmacy or Health Dept. Aware to provide a copy of the vaccination record if obtained from local pharmacy or Health Dept. Verbalized acceptance and understanding.  Flu Vaccine status: Declined, Education has been provided regarding the importance of this vaccine but patient still declined. Advised may receive this vaccine at local pharmacy or Health Dept. Aware to provide a copy of the vaccination record if obtained from local pharmacy or Health Dept. Verbalized acceptance and understanding.  Pneumococcal vaccine status: Up to date  Covid-19 vaccine status: Completed vaccines  Qualifies for Shingles Vaccine? Yes    Zostavax completed No   Shingrix Completed?: No.    Education has been provided  regarding the importance of this vaccine. Patient has been advised to call insurance company to determine out of pocket expense if they have not yet received this vaccine. Advised may also receive vaccine at local pharmacy or Health Dept. Verbalized acceptance and understanding.  Screening Tests Health Maintenance  Topic Date Due   Zoster Vaccines- Shingrix (1 of 2) Never done   Pneumonia Vaccine 28+ Years old (3 - PPSV23 if available, else PCV20) 08/24/2019   COVID-19 Vaccine (3 - Pfizer risk series) 03/27/2020   TETANUS/TDAP  04/01/2021   MAMMOGRAM  09/24/2022   COLONOSCOPY (Pts 45-8yrs Insurance coverage will need to be confirmed)  10/26/2022   DEXA SCAN  Completed   HPV VACCINES  Aged Out   INFLUENZA VACCINE  Discontinued   Hepatitis C Screening  Discontinued    Health Maintenance  Health Maintenance Due  Topic Date Due   Zoster Vaccines- Shingrix (1 of 2) Never done   Pneumonia Vaccine 56+ Years old (24 - PPSV23 if available, else PCV20) 08/24/2019   COVID-19 Vaccine (3 - Pfizer risk series) 03/27/2020   TETANUS/TDAP  04/01/2021    Colorectal cancer screening: Type of screening: Colonoscopy. Completed 10/26/2017. Repeat every 5 years  Mammogram status: scheduled for 10/30/2021  Bone Density status: Completed 07/07/2019.   Lung Cancer Screening: (Low Dose CT Chest recommended if Age 50-80 years, 30 pack-year currently smoking OR have quit w/in 15years.) does not qualify.   Lung Cancer Screening Referral: no  Additional Screening:  Hepatitis C Screening: does qualify; discontinued  Vision Screening: Recommended annual ophthalmology exams for early detection of glaucoma and other disorders of the eye. Is the patient up to date with their annual eye exam?  No  Who is the provider or what is the name of the office in which the patient attends annual eye exams? Can't remember name If pt is not  established with a provider, would they like to be referred to a provider to establish care? No .   Dental Screening: Recommended annual dental exams for proper oral hygiene  Community Resource Referral / Chronic Care Management: CRR required this visit?  No   CCM required this visit?  No      Plan:     I have personally reviewed and noted the following in the patient's chart:   Medical and social history Use of alcohol, tobacco or illicit drugs  Current medications and supplements including opioid prescriptions.  Functional ability and status Nutritional status Physical activity Advanced directives List of other physicians Hospitalizations, surgeries, and ER visits in previous 12 months Vitals Screenings to include cognitive, depression, and falls Referrals and appointments  In addition, I have reviewed and discussed with patient certain preventive protocols, quality metrics, and best practice recommendations. A written personalized care plan for preventive services as well as general preventive health recommendations were provided to patient.     Kellie Simmering, LPN   [id number]   Nurse Notes: none

## 2021-10-27 DIAGNOSIS — L4050 Arthropathic psoriasis, unspecified: Secondary | ICD-10-CM | POA: Diagnosis not present

## 2021-10-30 DIAGNOSIS — Z124 Encounter for screening for malignant neoplasm of cervix: Secondary | ICD-10-CM | POA: Diagnosis not present

## 2021-10-30 DIAGNOSIS — Z1231 Encounter for screening mammogram for malignant neoplasm of breast: Secondary | ICD-10-CM | POA: Diagnosis not present

## 2021-10-30 LAB — HM MAMMOGRAPHY

## 2021-11-04 ENCOUNTER — Telehealth: Payer: Self-pay

## 2021-11-04 NOTE — Telephone Encounter (Signed)
Pt. Called stating she thinks she is addicted to Tramadol and would like to get off of it if she can. She wanted to discuss possible other medication said you had discussed cymbalta at one point. Said she has been feeling really sad and depressed just sitting around sometimes. I got her scheduled tomorrow with you but she also wanted me to send you this message as well. I leave early today so please send this back to your nurse to handle if anything is needed.

## 2021-11-05 ENCOUNTER — Telehealth: Payer: Self-pay | Admitting: Medical

## 2021-11-05 ENCOUNTER — Ambulatory Visit (INDEPENDENT_AMBULATORY_CARE_PROVIDER_SITE_OTHER): Payer: Medicare Other | Admitting: Medical

## 2021-11-05 ENCOUNTER — Other Ambulatory Visit: Payer: Self-pay

## 2021-11-05 VITALS — BP 110/64 | HR 86 | Wt 196.8 lb

## 2021-11-05 DIAGNOSIS — F1920 Other psychoactive substance dependence, uncomplicated: Secondary | ICD-10-CM | POA: Diagnosis not present

## 2021-11-05 DIAGNOSIS — I10 Essential (primary) hypertension: Secondary | ICD-10-CM | POA: Diagnosis not present

## 2021-11-05 DIAGNOSIS — F418 Other specified anxiety disorders: Secondary | ICD-10-CM | POA: Diagnosis not present

## 2021-11-05 DIAGNOSIS — M7061 Trochanteric bursitis, right hip: Secondary | ICD-10-CM | POA: Diagnosis not present

## 2021-11-05 DIAGNOSIS — I2510 Atherosclerotic heart disease of native coronary artery without angina pectoris: Secondary | ICD-10-CM

## 2021-11-05 DIAGNOSIS — I700 Atherosclerosis of aorta: Secondary | ICD-10-CM

## 2021-11-05 DIAGNOSIS — Z7185 Encounter for immunization safety counseling: Secondary | ICD-10-CM

## 2021-11-05 DIAGNOSIS — M5136 Other intervertebral disc degeneration, lumbar region: Secondary | ICD-10-CM | POA: Diagnosis not present

## 2021-11-05 DIAGNOSIS — M159 Polyosteoarthritis, unspecified: Secondary | ICD-10-CM

## 2021-11-05 DIAGNOSIS — Z1231 Encounter for screening mammogram for malignant neoplasm of breast: Secondary | ICD-10-CM

## 2021-11-05 DIAGNOSIS — R921 Mammographic calcification found on diagnostic imaging of breast: Secondary | ICD-10-CM

## 2021-11-05 LAB — RESULTS CONSOLE HPV: CHL HPV: NEGATIVE

## 2021-11-05 LAB — HM PAP SMEAR: HM Pap smear: NEGATIVE

## 2021-11-05 MED ORDER — HYDROXYZINE HCL 10 MG PO TABS: 10.0000 mg | ORAL_TABLET | Freq: Two times a day (BID) | ORAL | 0 refills | Status: DC | PRN

## 2021-11-05 NOTE — Telephone Encounter (Signed)
Pt has mammogram done last week at Dr. Rogue Bussing obgyn office and states that they have been watching that nodule for years and everything is ok and her mammogram was normal. No further testing needs to be done

## 2021-11-05 NOTE — Progress Notes (Signed)
Subjective:  Paula Meyer is a 70 y.o. female who presents for Chief Complaint  Patient presents with   discuss medication    Discuss medication- wants to come off tramadol     Medical team: Dr. Latanya Maudlin, ortho Dr. Levy Pupa, spine/ortho Dr. Suella Broad, ortho Dr. Lahoma Rocker, [facility] Dr. Fransico Michael, cardiology   Here for concerns about medications.    She has been on Tramadol before coming to this office for care.   Was using this for pain, arthritis, worse with rainy days.  Most refill was through her arthritis doctor, Dr. Kathlene November.   Rheumatology is trying to get a different medication approved as insurance not wanting to cover Simponi.   Was given short term naprosyn which she is using.   Has some recent labs at that office.    She notes whenever she takes Tramadol, feels happy, has a lot of energy.  When not taking this feels down, bottom of the barrel.  Did a little test a few weeks ago, she broke her Tramadol into 4 pieces.   For 4 days taking 1/4 tablet daily was level in her mood.   Then went 3 days off medication.  Felt worse, then taking it again felt better.  Things she has a dependence to this medication.    Her arthritis doctor had prescribed Cymbalta in recent months but she never started it.  She has 2-3 bottles of this at home.    She is afraid of withdrawals not taking the tramadol as she plans to not take anymore this.  We recently changed her from rosuvastatin to Lipitor along with Praluent since her lipids were not at goal.  She did a recent coronary CT test and stress test.  Saw cardiology recently.  She declines vaccines today  No other aggravating or relieving factors.    No other c/o.  Past Medical History:  Diagnosis Date   Allergy    Anxiety    Arthritis    Asthma    Depression    Hx of colonic polyps    Hyperlipidemia    Hypertension    Osteoarthritis    Psoriatic arthritis ([place])    Current Outpatient  Medications on File Prior to Visit  Medication Sig Dispense Refill   albuterol (VENTOLIN HFA) 108 (90 Base) MCG/ACT inhaler INHALE 2 PUFFS INTO THE LUNGS EVERY 4 HOURS AS NEEDED FOR COUGH, WHEEZING OR SHORTNESS OF BREATH 6.7 g 0   Alirocumab (PRALUENT) 75 MG/ML SOAJ Inject 75 mg into the skin every 14 (fourteen) days. 2 mL 5   aspirin EC 81 MG tablet Take 1 tablet (81 mg total) by mouth daily. 90 tablet 1   atorvastatin (LIPITOR) 20 MG tablet Take 1 tablet (20 mg total) by mouth daily. 30 tablet 11   Azelastine-Fluticasone 137-50 MCG/ACT SUSP PLACE 1 SPRAY INTO THE NOSE 2 (TWO) TIMES DAILY AS NEEDED. 23 g 1   diclofenac sodium (VOLTAREN) 1 % GEL Apply 2 g topically 4 (four) times daily. 100 g 1   fluticasone (FLOVENT HFA) 110 MCG/ACT inhaler Inhale 2 puffs into the lungs 2 (two) times daily. 1 Inhaler 5   hydrochlorothiazide (HYDRODIURIL) 25 MG tablet Take 1 tablet (25 mg total) by mouth daily. 90 tablet 3   golimumab (SIMPONI ARIA) 50 MG/4ML SOLN injection  (Patient not taking: Reported on 11/05/2021)     No current facility-administered medications on file prior to visit.     The following portions of the patient's  history were reviewed and updated as appropriate: allergies, current medications, past family history, past medical history, past social history, past surgical history and problem list.  ROS Otherwise as in subjective above    Objective: BP 110/64   Pulse 86   Wt 196 lb 12.8 oz (89.3 kg)   LMP 04/02/2011   BMI 32.75 kg/m   General appearance: alert, no distress, well developed, well nourished [facility] [place], answers questions appropriate, good eye contact   Echocardiogram 10/21/21:  IMPRESSIONS Left ventricular ejection fraction, by estimation, is 60 to 65%. The left ventricle has normal function. The left ventricle has no regional wall motion abnormalities. Left ventricular diastolic parameters were normal. 1. Right ventricular systolic function is normal. The  right ventricular size is normal. Tricuspid regurgitation signal is inadequate for assessing PA pressure. 2. The mitral valve is normal in structure. No evidence of mitral valve regurgitation. No evidence of mitral stenosis. 3. The aortic valve is tricuspid. Aortic valve regurgitation is not visualized. Mild aortic valve sclerosis is present, with no evidence of aortic valve stenosis. 4. The inferior vena cava is normal in size with greater than 50% respiratory variability, suggesting right atrial pressure of 3 mmHg.   Nuclear stress test 10/21/2021-normal study, low risk, LV perfusion normal, no evidence of ischemia, EF 67%    CT coronary score 07/30/2021 shows total Agatston score 2498, [place] percentile 99, aortic atherosclerosis present as well     Assessment: Encounter Diagnoses  Name Primary?   Dependency on pain medication ([place]) Yes   DDD (degenerative disc disease), lumbar    Depression with anxiety    Essential hypertension, benign    Primary osteoarthritis involving multiple joints    Coronary artery disease involving native coronary artery of native heart without angina pectoris    Vaccine counseling    Aortic atherosclerosis ([place])    Breast calcifications    Screening mammogram for breast cancer      Plan: We discussed her apparent dependency on tramadol pain medication.  I advise she take the supply she has left to her local pharmacy for disposal.  Begin short-term hydroxyzine below for the next 1 to 2 weeks.  I recommended she go ahead and start the Cymbalta that she has at home but she never started prior.  I asked her to follow-up with Dr. Kathlene November within the next month as she may need titration of the Cymbalta that they prescribed.  She also will let them know about her dependency issue.  I reviewed her recent CT coronary score from July 30, 2021 that was abnormal showing coronary artery disease.  I also reviewed her echocardiogram and stress test she had done on  October 21, 2021.   Coronary artery disease, aortic atherosclerosis, hyperlipidemia-plan to recheck fasting labs in January.  We recently changed her to a atorvastatin as rosuvastatin was not getting her to goal.  She is also compliant with Praluent.  Advise healthy low-cholesterol diet   Vaccine counseling: You appear to be due for the following vaccines: Shingles vaccine, updated tetanus booster, Prevnar 20, yearly flu shot and COVID booster  You declined these today but I recommend you come in soon for flu shot and COVID booster as a next step    There was breast calcifications noted on CT scan.  We will schedule for mammogram diagnostic left, screening mammogram right.    Wylee was seen today for discuss medication.  Diagnoses and all orders for this visit:  Dependency on pain medication ([place])  DDD (  degenerative disc disease), lumbar  Depression with anxiety  Essential hypertension, benign  Primary osteoarthritis involving multiple joints  Coronary artery disease involving native coronary artery of native heart without angina pectoris  Vaccine counseling  Aortic atherosclerosis ([facility])  Breast calcifications -     MM Digital Diagnostic Unilat L; Future  Screening mammogram for breast cancer -     MM Digital Screening Unilat R; Future  Other orders -     hydrOXYzine (ATARAX/VISTARIL) 10 MG tablet; Take 1 tablet (10 mg total) by mouth 2 (two) times daily as needed.   Follow up: January 2023 fasting

## 2021-11-05 NOTE — Telephone Encounter (Signed)
After reviewing her chart and CT score also realized there was a breast calcification noted.  I went ahead and put in orders for left diagnostic mammogram and right screening mammogram.     [name], do you need to schedule this or will [facility] imaging call her to schedule?

## 2021-11-11 ENCOUNTER — Telehealth: Payer: Self-pay

## 2021-11-11 NOTE — Telephone Encounter (Signed)
Attempted to contact patient to make her aware that her genetic testing results were negative.   Patient voicemail was full and I was unable to leave a message.  Will send mychart message, and scan results into chart.

## 2021-11-12 ENCOUNTER — Other Ambulatory Visit: Payer: Self-pay | Admitting: Medical

## 2021-11-12 ENCOUNTER — Encounter: Payer: Self-pay | Admitting: Medical

## 2021-12-01 DIAGNOSIS — M545 Low back pain, unspecified: Secondary | ICD-10-CM | POA: Diagnosis not present

## 2021-12-01 DIAGNOSIS — M542 Cervicalgia: Secondary | ICD-10-CM | POA: Diagnosis not present

## 2021-12-01 DIAGNOSIS — M7071 Other bursitis of hip, right hip: Secondary | ICD-10-CM | POA: Diagnosis not present

## 2021-12-01 DIAGNOSIS — M5459 Other low back pain: Secondary | ICD-10-CM | POA: Diagnosis not present

## 2021-12-18 IMAGING — CT CT CARDIAC CORONARY ARTERY CALCIUM SCORE
3 series · 14 of 20 positions shown, 16 images · non-contrast
Comparison: None.

CLINICAL DATA: Hyperlipidemia, hypertension

EXAM:
CT CARDIAC CORONARY ARTERY CALCIUM SCORE
TECHNIQUE: Non-contrast imaging through the heart was performed using
prospective ECG gating. Image post processing was performed on an
independent workstation, allowing for quantitative analysis of the
heart and coronary arteries. Note that this exam targets the heart
and the chest was not imaged in its entirety.

[Series 2: calcium scoring 2.00 qr36 bestdiast 71% hrt calciu · axial · 0.41mm/px · z∈[+1767,+1839]mm · 4 of 60 slices shown]
[im 12/60  vessel]
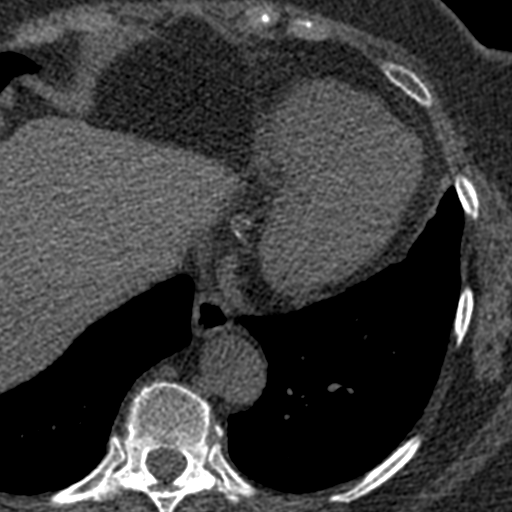
[im 24/60  vessel]
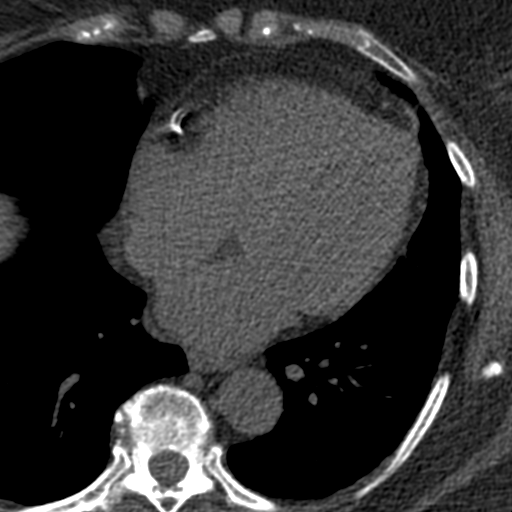
[im 36/60  vessel]
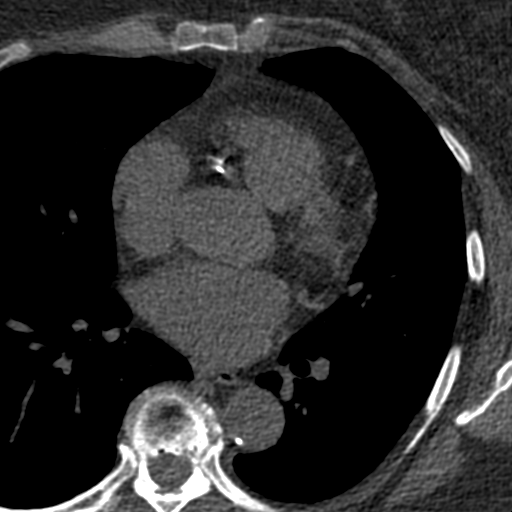
[im 48/60  vessel]
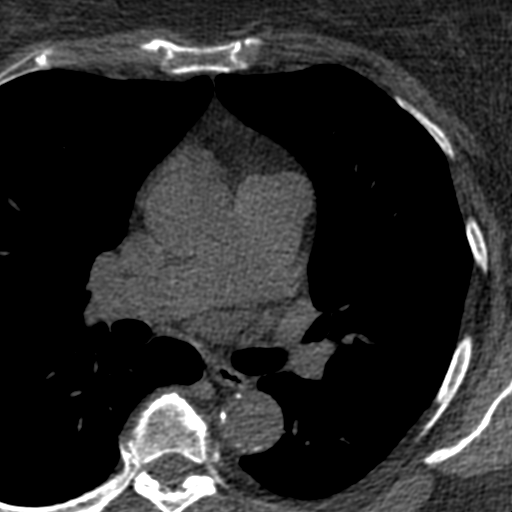

[Series 3: calcium scoring 2.00 br40 bestdiast 71% axial · axial · 0.59mm/px · z∈[+1763,+1843]mm · 5 of 60 slices shown, 7 images]
[im 10/60  vessel]
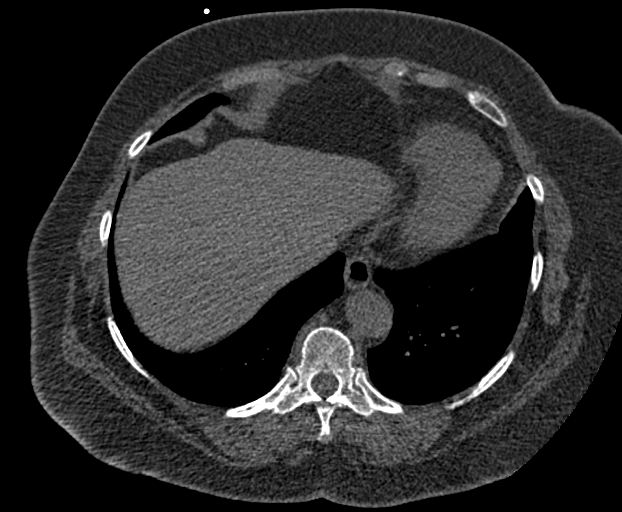
[im 10/60  lung]
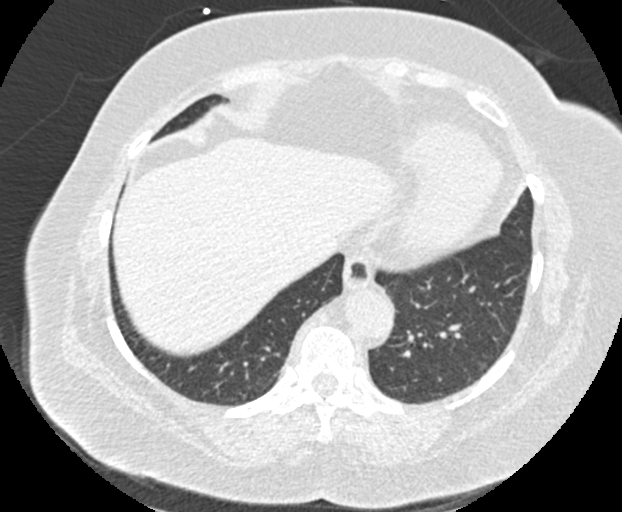
[im 20/60  vessel]
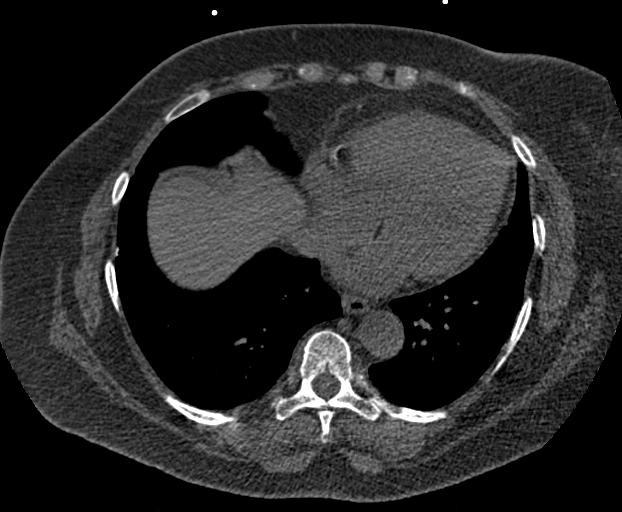
[im 30/60  vessel]
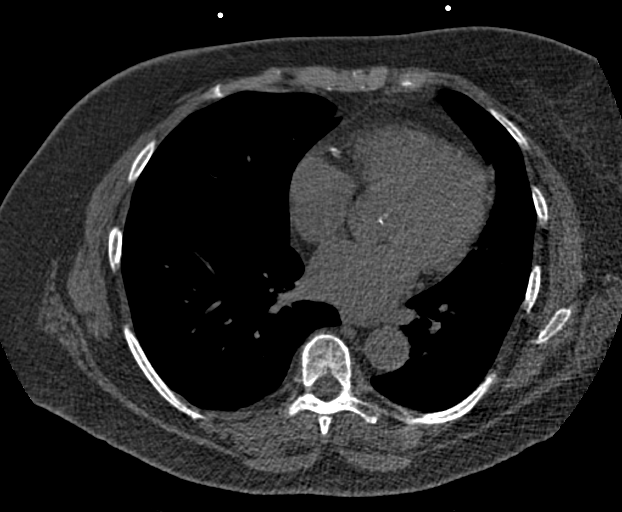
[im 40/60  vessel]
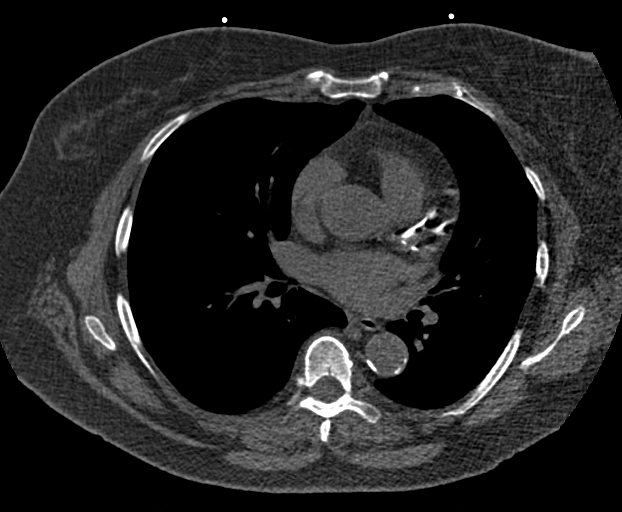
[im 50/60  vessel]
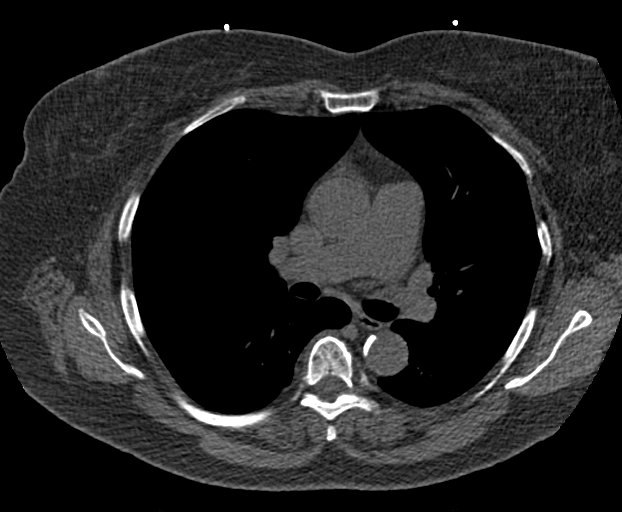
[im 50/60  lung]
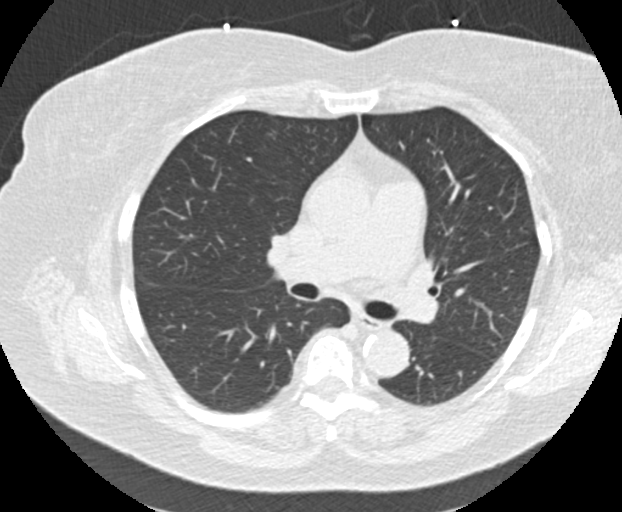

[Series 9: calcium scoring 2.00 br60 bestdiast 71% lungs · axial · 0.61mm/px · z∈[+1763,+1843]mm · 5 of 60 slices shown]
[im 10/60  vessel]
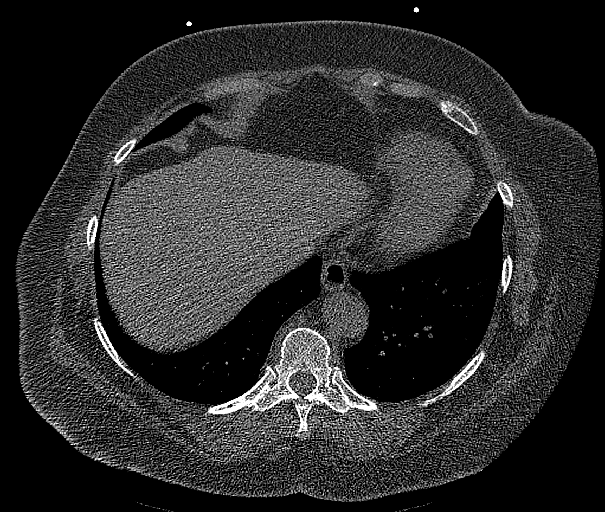
[im 20/60  vessel]
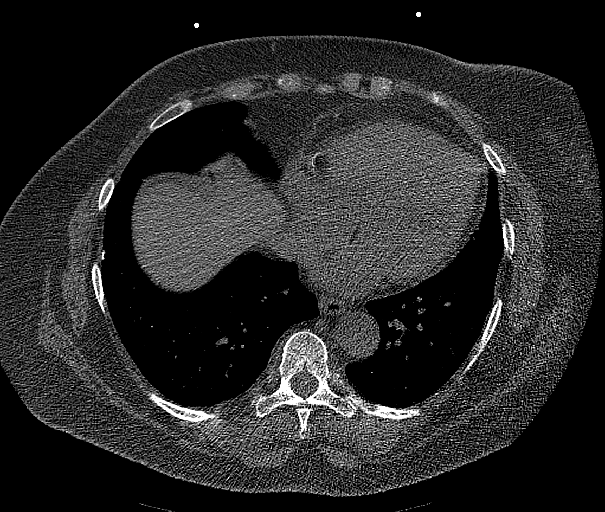
[im 30/60  vessel]
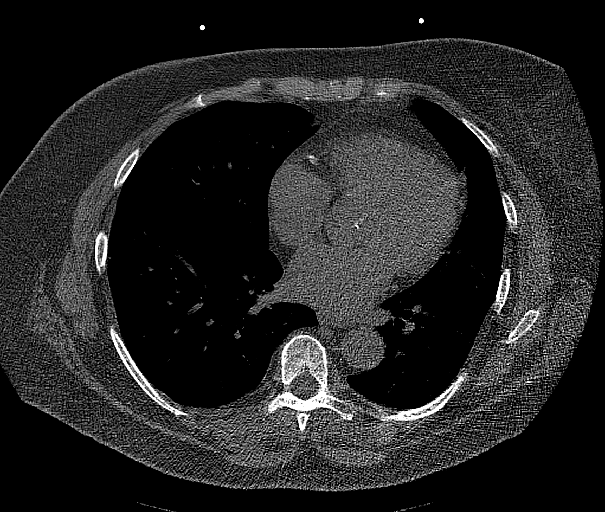
[im 40/60  vessel]
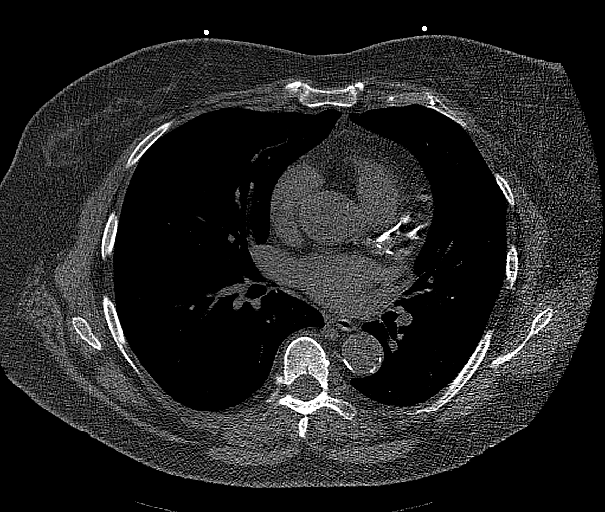
[im 50/60  vessel]
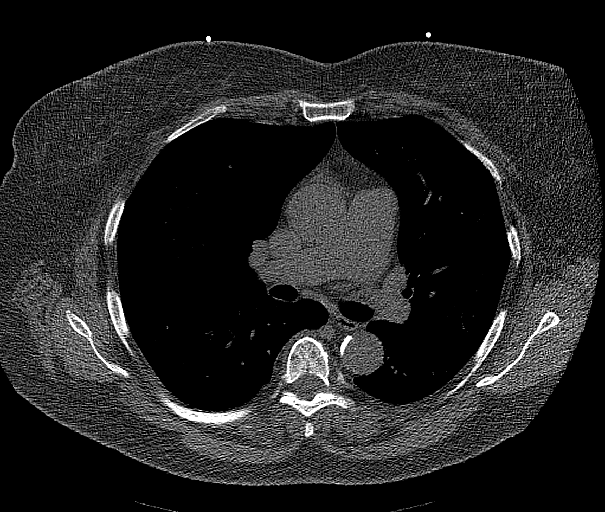

[14 of 20 positions shown; findings below may reference images not displayed]

FINDINGS: CORONARY CALCIUM SCORES:

Left Main: 0

LAD: 2255

LCx: 98

RCA: 1009

Total Agatston Score: 2,498

[HOSPITAL] percentile: 99

AORTA MEASUREMENTS:

Ascending Aorta: 35 mm

Descending Aorta: 27 mm

OTHER FINDINGS:

Heart is normal size. Aorta normal caliber. Calcifications
throughout the descending thoracic aorta. No adenopathy. No
confluent opacities or effusions. Imaging into the upper abdomen
demonstrates no acute findings. Partially calcified nodule in the
left breast measures 1.6 cm. This cannot be characterized on this
CT. No acute bony abnormality.
IMPRESSION: Total Agatston score: 2,498

[HOSPITAL] percentile: 99

Partially calcified nodule in the left breast which cannot be
characterized by CT. Recommend correlation with mammography.

Aortic atherosclerosis.

## 2022-01-01 ENCOUNTER — Ambulatory Visit: Payer: Medicare PPO | Admitting: Medical

## 2022-01-01 VITALS — BP 120/80 | HR 75 | Wt 200.4 lb

## 2022-01-01 DIAGNOSIS — E785 Hyperlipidemia, unspecified: Secondary | ICD-10-CM | POA: Diagnosis not present

## 2022-01-01 DIAGNOSIS — I10 Essential (primary) hypertension: Secondary | ICD-10-CM

## 2022-01-01 DIAGNOSIS — I700 Atherosclerosis of aorta: Secondary | ICD-10-CM

## 2022-01-01 DIAGNOSIS — K5904 Chronic idiopathic constipation: Secondary | ICD-10-CM

## 2022-01-01 DIAGNOSIS — I2510 Atherosclerotic heart disease of native coronary artery without angina pectoris: Secondary | ICD-10-CM

## 2022-01-01 DIAGNOSIS — M5136 Other intervertebral disc degeneration, lumbar region: Secondary | ICD-10-CM

## 2022-01-01 NOTE — Progress Notes (Signed)
Subjective:  Paula Meyer is a 71 y.o. female who presents for Chief Complaint  Patient presents with   fasting med check    Fasting med check. Declines flu shot     Medical team: Dr. Latanya Maudlin, ortho Dr. Levy Pupa, spine/ortho Dr. Suella Broad, ortho Dr. Lahoma Rocker, [facility] Dr. Fransico Michael, cardiology Dr. Lucio Edward, GI  Here for f/u on lipids.   This visit is supposed to be a med check on lipids.  She was here in October and was already on Praluent at that time we added a statin to help get lipids to goal.  She has had previous issues tolerating statins but was willing to give this a try.  Currently she is actually tolerating the medication okay.  She had some brain fog sensation on Lipitor prior but currently not experiencing this.  Her main concern today is to discuss her recent visit with chiropractor and back specialist  She has ongoing chronic pain between her buttocks and in her low back and legs.  She recently saw Dr. Nelva Bush.  There was talk of possibly needing to do surgery.  She also has been seeing a Restaurant manager, fast food.  She was concerned that her x-rays at the chiropractor compared to her recent MRI shows significant worsening in just a short period of time.  This is confusing to her.  She was recently prescribed gabapentin by Dr. Herma Mering.  She feels like the gabapentin is making her skin dry and she did not like the way it made her feel.  It was not helping the pain either.  She felt that the gabapentin was also making her constipated.  So she stopped this on her own.  Currently she is doing combination Tylenol and anti-inflammatory over-the-counter.  When she and I talked back in October I recommended considering Cymbalta for pain and mood as she was having some issues with depressed mood at that time as well.  She never started that medication.  Regarding constipation she is using Benefiber daily.  She is also been using  Per Diem laxative  over-the-counter.  This is helping but she is curious if she should continue this.    She is due to have repeat colonoscopy with Dr. Fuller Plan this November 2022  No other aggravating or relieving factors.    No other c/o.  The following portions of the patient's history were reviewed and updated as appropriate: allergies, current medications, past family history, past medical history, past social history, past surgical history and problem list.  ROS Otherwise as in subjective above  Objective: BP 120/80    Pulse 75    Wt 200 lb 6.4 oz (90.9 kg)    LMP 04/02/2011    BMI 33.35 kg/m   Wt Readings from Last 3 Encounters:  01/01/22 200 lb 6.4 oz (90.9 kg)  11/05/21 196 lb 12.8 oz (89.3 kg)  10/24/21 191 lb (86.6 kg)    General appearance: alert, no distress, well developed, well nourished Neck: supple, no lymphadenopathy, no thyromegaly, no masses Abdomen: +bs, soft, non tender, non distended, no masses, no hepatomegaly, no splenomegaly Pulses: 2+ radial pulses, 2+ pedal pulses, normal cap refill Ext: no edema   Assessment: Encounter Diagnoses  Name Primary?   Aortic atherosclerosis (HCC) Yes   Hyperlipidemia, unspecified hyperlipidemia type    Coronary artery disease involving native coronary artery of native heart without angina pectoris    Essential hypertension, benign    Chronic idiopathic constipation    DDD (degenerative  disc disease), lumbar      Plan: Hyperlipidemia, CAD, aortic atherosclerosis - updated lipid lab today fasting, since starting statin 09/2021 to help with Praluent injection.    We will request records from orthopedics from Dr. Nelva Bush.  I will try to express sympathy for her pain and situation.  I recommended she follow-up with orthopedics about her medication issues, intolerance and next steps.    Constipation-continue fiber supplement, continue laxative prn.     Paula Meyer was seen today for fasting med check.  Diagnoses and all orders for this  visit:  Aortic atherosclerosis ([place]) -     Lipid panel  Hyperlipidemia, unspecified hyperlipidemia type -     Lipid panel  Coronary artery disease involving native coronary artery of native heart without angina pectoris -     Lipid panel  Essential hypertension, benign  Chronic idiopathic constipation  DDD (degenerative disc disease), lumbar    Follow up: pending labs

## 2022-01-02 ENCOUNTER — Other Ambulatory Visit: Payer: Self-pay | Admitting: Medical

## 2022-01-02 DIAGNOSIS — E785 Hyperlipidemia, unspecified: Secondary | ICD-10-CM

## 2022-01-02 LAB — LIPID PANEL
Chol/HDL Ratio: 2.3 ratio (ref 0.0–4.4)
Cholesterol, Total: 141 mg/dL (ref 100–199)
HDL: 62 mg/dL (ref >39)
LDL Chol Calc (NIH): 65 mg/dL (ref 0–99)
Triglycerides: 71 mg/dL (ref 0–149)
VLDL Cholesterol Cal: 14 mg/dL (ref 5–40)

## 2022-01-02 MED ORDER — PRALUENT 75 MG/ML ~~LOC~~ SOAJ: 75.0000 mg | SUBCUTANEOUS | 11 refills | Status: DC

## 2022-01-02 MED ORDER — ATORVASTATIN CALCIUM 20 MG PO TABS: 20.0000 mg | ORAL_TABLET | Freq: Every day | ORAL | 3 refills | Status: DC

## 2022-01-02 MED ORDER — ASPIRIN EC 81 MG PO TBEC: 81.0000 mg | DELAYED_RELEASE_TABLET | Freq: Every day | ORAL | 3 refills | Status: DC

## 2022-01-15 DIAGNOSIS — M5416 Radiculopathy, lumbar region: Secondary | ICD-10-CM | POA: Diagnosis not present

## 2022-01-18 ENCOUNTER — Other Ambulatory Visit: Payer: Self-pay | Admitting: Medical

## 2022-01-19 DIAGNOSIS — M9903 Segmental and somatic dysfunction of lumbar region: Secondary | ICD-10-CM | POA: Diagnosis not present

## 2022-01-19 DIAGNOSIS — L4050 Arthropathic psoriasis, unspecified: Secondary | ICD-10-CM | POA: Diagnosis not present

## 2022-01-19 DIAGNOSIS — M25551 Pain in right hip: Secondary | ICD-10-CM | POA: Diagnosis not present

## 2022-01-19 DIAGNOSIS — M9905 Segmental and somatic dysfunction of pelvic region: Secondary | ICD-10-CM | POA: Diagnosis not present

## 2022-01-19 DIAGNOSIS — M5116 Intervertebral disc disorders with radiculopathy, lumbar region: Secondary | ICD-10-CM | POA: Diagnosis not present

## 2022-01-20 DIAGNOSIS — M9903 Segmental and somatic dysfunction of lumbar region: Secondary | ICD-10-CM | POA: Diagnosis not present

## 2022-01-20 DIAGNOSIS — M5116 Intervertebral disc disorders with radiculopathy, lumbar region: Secondary | ICD-10-CM | POA: Diagnosis not present

## 2022-01-20 DIAGNOSIS — M25551 Pain in right hip: Secondary | ICD-10-CM | POA: Diagnosis not present

## 2022-01-20 DIAGNOSIS — M9905 Segmental and somatic dysfunction of pelvic region: Secondary | ICD-10-CM | POA: Diagnosis not present

## 2022-01-22 DIAGNOSIS — M9903 Segmental and somatic dysfunction of lumbar region: Secondary | ICD-10-CM | POA: Diagnosis not present

## 2022-01-22 DIAGNOSIS — M9905 Segmental and somatic dysfunction of pelvic region: Secondary | ICD-10-CM | POA: Diagnosis not present

## 2022-01-22 DIAGNOSIS — M5116 Intervertebral disc disorders with radiculopathy, lumbar region: Secondary | ICD-10-CM | POA: Diagnosis not present

## 2022-01-22 DIAGNOSIS — M25551 Pain in right hip: Secondary | ICD-10-CM | POA: Diagnosis not present

## 2022-01-26 DIAGNOSIS — M25551 Pain in right hip: Secondary | ICD-10-CM | POA: Diagnosis not present

## 2022-01-26 DIAGNOSIS — M5116 Intervertebral disc disorders with radiculopathy, lumbar region: Secondary | ICD-10-CM | POA: Diagnosis not present

## 2022-01-26 DIAGNOSIS — M9903 Segmental and somatic dysfunction of lumbar region: Secondary | ICD-10-CM | POA: Diagnosis not present

## 2022-01-26 DIAGNOSIS — M9905 Segmental and somatic dysfunction of pelvic region: Secondary | ICD-10-CM | POA: Diagnosis not present

## 2022-01-27 DIAGNOSIS — M9905 Segmental and somatic dysfunction of pelvic region: Secondary | ICD-10-CM | POA: Diagnosis not present

## 2022-01-27 DIAGNOSIS — M9903 Segmental and somatic dysfunction of lumbar region: Secondary | ICD-10-CM | POA: Diagnosis not present

## 2022-01-27 DIAGNOSIS — M5116 Intervertebral disc disorders with radiculopathy, lumbar region: Secondary | ICD-10-CM | POA: Diagnosis not present

## 2022-01-27 DIAGNOSIS — M25551 Pain in right hip: Secondary | ICD-10-CM | POA: Diagnosis not present

## 2022-01-29 DIAGNOSIS — M5116 Intervertebral disc disorders with radiculopathy, lumbar region: Secondary | ICD-10-CM | POA: Diagnosis not present

## 2022-01-29 DIAGNOSIS — M9903 Segmental and somatic dysfunction of lumbar region: Secondary | ICD-10-CM | POA: Diagnosis not present

## 2022-01-29 DIAGNOSIS — M9905 Segmental and somatic dysfunction of pelvic region: Secondary | ICD-10-CM | POA: Diagnosis not present

## 2022-01-29 DIAGNOSIS — M25551 Pain in right hip: Secondary | ICD-10-CM | POA: Diagnosis not present

## 2022-01-30 ENCOUNTER — Encounter: Payer: Self-pay | Admitting: Internal Medicine

## 2022-01-30 MED ORDER — PRALUENT 75 MG/ML ~~LOC~~ SOAJ: 75.0000 mg | SUBCUTANEOUS | 1 refills | Status: DC

## 2022-02-03 DIAGNOSIS — M9903 Segmental and somatic dysfunction of lumbar region: Secondary | ICD-10-CM | POA: Diagnosis not present

## 2022-02-03 DIAGNOSIS — M9905 Segmental and somatic dysfunction of pelvic region: Secondary | ICD-10-CM | POA: Diagnosis not present

## 2022-02-03 DIAGNOSIS — M5116 Intervertebral disc disorders with radiculopathy, lumbar region: Secondary | ICD-10-CM | POA: Diagnosis not present

## 2022-02-03 DIAGNOSIS — M25551 Pain in right hip: Secondary | ICD-10-CM | POA: Diagnosis not present

## 2022-02-05 DIAGNOSIS — M25551 Pain in right hip: Secondary | ICD-10-CM | POA: Diagnosis not present

## 2022-02-05 DIAGNOSIS — M5116 Intervertebral disc disorders with radiculopathy, lumbar region: Secondary | ICD-10-CM | POA: Diagnosis not present

## 2022-02-05 DIAGNOSIS — M9905 Segmental and somatic dysfunction of pelvic region: Secondary | ICD-10-CM | POA: Diagnosis not present

## 2022-02-05 DIAGNOSIS — M9903 Segmental and somatic dysfunction of lumbar region: Secondary | ICD-10-CM | POA: Diagnosis not present

## 2022-02-12 DIAGNOSIS — M5416 Radiculopathy, lumbar region: Secondary | ICD-10-CM | POA: Diagnosis not present

## 2022-02-12 DIAGNOSIS — M5459 Other low back pain: Secondary | ICD-10-CM | POA: Diagnosis not present

## 2022-02-16 DIAGNOSIS — L4050 Arthropathic psoriasis, unspecified: Secondary | ICD-10-CM | POA: Diagnosis not present

## 2022-02-16 DIAGNOSIS — Z1159 Encounter for screening for other viral diseases: Secondary | ICD-10-CM | POA: Diagnosis not present

## 2022-02-19 ENCOUNTER — Encounter: Payer: Self-pay | Admitting: Internal Medicine

## 2022-03-03 DIAGNOSIS — M5451 Vertebrogenic low back pain: Secondary | ICD-10-CM | POA: Diagnosis not present

## 2022-03-03 DIAGNOSIS — M5136 Other intervertebral disc degeneration, lumbar region: Secondary | ICD-10-CM | POA: Diagnosis not present

## 2022-03-04 ENCOUNTER — Telehealth: Payer: Self-pay | Admitting: Medical

## 2022-03-04 NOTE — Telephone Encounter (Signed)
I received a surgery clearance request ? ?Please schedule patient for a visit soon, fasting.  Typically preop visits require labs within 30 days of the upcoming surgery, review of chart records to assess risk, and then possibly corresponding with other specialist if needed.  ?

## 2022-03-04 NOTE — Telephone Encounter (Signed)
Hello Dr. Audie Box ? ?I received the surgery preop request.  She is having lumbar decompression soon.  Can you comment on her clearance for surgery from a cardiac standpoint or do you need to see her first?   ? ?Thanks for your help ?Dorothea Ogle, PA-C, primary care ? ?

## 2022-03-05 ENCOUNTER — Telehealth: Payer: Self-pay | Admitting: *Deleted

## 2022-03-05 ENCOUNTER — Encounter: Payer: Self-pay | Admitting: Medical

## 2022-03-05 ENCOUNTER — Ambulatory Visit (INDEPENDENT_AMBULATORY_CARE_PROVIDER_SITE_OTHER): Payer: Medicare Other | Admitting: Medical

## 2022-03-05 VITALS — BP 122/80 | HR 83 | Wt 207.8 lb

## 2022-03-05 DIAGNOSIS — D518 Other vitamin B12 deficiency anemias: Secondary | ICD-10-CM | POA: Diagnosis not present

## 2022-03-05 DIAGNOSIS — I2510 Atherosclerotic heart disease of native coronary artery without angina pectoris: Secondary | ICD-10-CM | POA: Diagnosis not present

## 2022-03-05 DIAGNOSIS — M5136 Other intervertebral disc degeneration, lumbar region: Secondary | ICD-10-CM

## 2022-03-05 DIAGNOSIS — F418 Other specified anxiety disorders: Secondary | ICD-10-CM

## 2022-03-05 DIAGNOSIS — Z6834 Body mass index (BMI) 34.0-34.9, adult: Secondary | ICD-10-CM | POA: Diagnosis not present

## 2022-03-05 DIAGNOSIS — H1013 Acute atopic conjunctivitis, bilateral: Secondary | ICD-10-CM

## 2022-03-05 DIAGNOSIS — K219 Gastro-esophageal reflux disease without esophagitis: Secondary | ICD-10-CM

## 2022-03-05 DIAGNOSIS — Z01818 Encounter for other preprocedural examination: Secondary | ICD-10-CM

## 2022-03-05 DIAGNOSIS — L4050 Arthropathic psoriasis, unspecified: Secondary | ICD-10-CM | POA: Insufficient documentation

## 2022-03-05 DIAGNOSIS — F411 Generalized anxiety disorder: Secondary | ICD-10-CM

## 2022-03-05 DIAGNOSIS — R748 Abnormal levels of other serum enzymes: Secondary | ICD-10-CM

## 2022-03-05 DIAGNOSIS — I10 Essential (primary) hypertension: Secondary | ICD-10-CM

## 2022-03-05 DIAGNOSIS — I700 Atherosclerosis of aorta: Secondary | ICD-10-CM

## 2022-03-05 DIAGNOSIS — J4530 Mild persistent asthma, uncomplicated: Secondary | ICD-10-CM

## 2022-03-05 DIAGNOSIS — Z23 Encounter for immunization: Secondary | ICD-10-CM

## 2022-03-05 DIAGNOSIS — M5030 Other cervical disc degeneration, unspecified cervical region: Secondary | ICD-10-CM

## 2022-03-05 NOTE — Telephone Encounter (Signed)
Her aspirin is prescribed by her PCP.  Recommendations for holding aspirin will need to come from prescribing provider. ? ?Preoperative team, please contact this patient and set up a phone call appointment for further cardiac evaluation.  Thank you for your help. ? ?Jossie Ng. Darrien Laakso NP-C ? ?  ?03/05/2022, 1:25 PM ?[place] ?[address] ?Office [phone] Fax [phone] ? ?

## 2022-03-05 NOTE — Progress Notes (Signed)
?Subjective:  ? ?HPI ? Paula Meyer is a 71 y.o. female who presents for ?Chief Complaint  ?Patient presents with  ? surgery clearance  ?  Surgery clearance- emergo Dr. Tonita Cong- not sure when surgery is  ? ? ?Patient Care Team: ?Vershawn Westrup, Leward Quan as PCP - General (Family Medicine) ?Allyn Kenner, DO as Consulting Physician (Obstetrics and Gynecology) ?Sees dentist ?Sees eye doctor ?Dr. Suella Broad, Dr. Susa Day, Dr. Latanya Maudlin, orthopedics ?Dr. Lahoma Rocker, rheumatology ?Dr. Cassie Freer O'Neal, cardiology ?Dr. Lucio Edward, GI ?Asthma and allergy clinic prior ? ? ?Concerns: ?Here for preop exam.  Having back surgery soon, Dr. Tonita Cong.   ? ?She is a relatively new patient here ? ?History of asthma, no recent shortness of breath.  Not having to use inhaler lately. ? ?Her main issue lately has been pain.  She sees orthopedics.  Currently on 2 hydrocodone 3 times a day as well as gabapentin. ? ?No other new concerns  ? ?Past Medical History:  ?Diagnosis Date  ? Allergy   ? Anxiety   ? Aortic atherosclerosis ([facility]) 2022  ? thoracic  ? Arthritis   ? Asthma   ? CAD (coronary artery disease)   ? Coronary artery calcification seen on CT scan 07/2021  ? Depression   ? Hx of colonic polyps   ? Hyperlipidemia   ? Hypertension   ? Osteoarthritis   ? Psoriatic arthritis ([place])   ? ?Past Surgical History:  ?Procedure Laterality Date  ? COLONOSCOPY  2018  ? TUBAL LIGATION    ? WISDOM TOOTH EXTRACTION    ? ? ? ?Family History  ?Problem Relation Age of Onset  ? Heart attack Brother   ? Hyperlipidemia Brother   ? Hypertension Brother   ? Heart disease Brother   ? Arthritis Other   ? Depression Other   ? Colon cancer Neg Hx   ? Stomach cancer Neg Hx   ? ? ? ?Current Outpatient Medications:  ?  albuterol (VENTOLIN HFA) 108 (90 Base) MCG/ACT inhaler, INHALE 2 PUFFS INTO THE LUNGS EVERY 4 HOURS AS NEEDED FOR COUGH, WHEEZING OR SHORTNESS OF BREATH, Disp: 6.7 g, Rfl: 0 ?  Alirocumab (PRALUENT) 75 MG/ML SOAJ, Inject 75  mg into the skin every 14 (fourteen) days., Disp: 2 mL, Rfl: 1 ?  aspirin EC 81 MG tablet, Take 1 tablet (81 mg total) by mouth daily., Disp: 90 tablet, Rfl: 3 ?  atorvastatin (LIPITOR) 20 MG tablet, Take 1 tablet (20 mg total) by mouth daily., Disp: 90 tablet, Rfl: 3 ?  Azelastine-Fluticasone 137-50 MCG/ACT SUSP, PLACE 1 SPRAY INTO THE NOSE 2 (TWO) TIMES DAILY AS NEEDED., Disp: 23 g, Rfl: 1 ?  diclofenac sodium (VOLTAREN) 1 % GEL, Apply 2 g topically 4 (four) times daily., Disp: 100 g, Rfl: 1 ?  golimumab (SIMPONI ARIA) 50 MG/4ML SOLN injection, , Disp: , Rfl:  ?  hydrochlorothiazide (HYDRODIURIL) 25 MG tablet, Take 1 tablet (25 mg total) by mouth daily., Disp: 90 tablet, Rfl: 3 ?  gabapentin (NEURONTIN) 300 MG capsule, Take 600 mg by mouth 3 (three) times daily., Disp: , Rfl:  ?  HYDROcodone-acetaminophen (NORCO/VICODIN) 5-325 MG tablet, Take 2 tablets by mouth 3 (three) times daily as needed., Disp: , Rfl:  ? ?Allergies  ?Allergen Reactions  ? Paxil [Paroxetine Hcl]   ?  Makes her grind her teeth  ? Bupropion Hcl Other (See Comments)  ?  palpitations  ? Contrast Media [Iodinated Contrast Media] Hives and Itching  ?  Evac [Psyllium]   ? Lisinopril   ?  cough  ? Tramadol   ?  Dependency, avoid narcotics  ? ? ?Social History  ? ?Social History Narrative  ? Lives alone and her dog.   Retired.   Children 2.   Has 3 story house.  02/2022.  ? ? ? ?reviewed their medical, surgical, family, social, medication, and allergy history and updated chart as appropriate. ? ? ?Review of Systems ?Constitutional: -fever, -chills, -sweats, -unexpected weight change, -decreased appetite, -fatigue ?Allergy: -sneezing, -itching, -congestion ?Dermatology: -changing moles, --rash, -lumps ?ENT: -runny nose, -ear pain, -sore throat, -hoarseness, -sinus pain, -teeth pain, - ringing in ears, -hearing loss, -nosebleeds ?Cardiology: -chest pain, -palpitations, -swelling, -difficulty breathing when lying flat, -waking up short of  breath ?Respiratory: -cough, -shortness of breath, -difficulty breathing with exercise or exertion, -wheezing, -coughing up blood ?Gastroenterology: -abdominal pain, -nausea, -vomiting, -diarrhea, -constipation, -blood in stool, -changes in bowel movement, -difficulty swallowing or eating ?Hematology: -bleeding, -bruising  ?Musculoskeletal: +joint aches, -muscle aches, -joint swelling, +back pain, -neck pain, -cramping, -changes in gait ?Ophthalmology: denies vision changes, eye redness, itching, discharge ?Urology: -burning with urination, -difficulty urinating, -blood in urine, -urinary frequency, -urgency, -incontinence ?Neurology: -headache, -weakness, -tingling, -numbness, -memory loss, -falls, -dizziness ?Psychology: -depressed mood, -agitation, -sleep problems ?Breast/gyn: -breast tendnerss, -discharge, -lumps, -vaginal discharge,- irregular periods, -heavy periods ? ? ? ?  03/05/2022  ?  8:31 AM 10/24/2021  ?  2:28 PM 08/19/2021  ? 10:19 AM 02/25/2021  ?  4:30 PM 01/22/2021  ?  2:41 PM  ?Depression screen PHQ 2/9  ?Decreased Interest 0 0 0 2 0  ?Down, Depressed, Hopeless 0 0 1 1 0  ?PHQ - 2 Score 0 0 1 3 0  ?Altered sleeping 0   3   ?Tired, decreased energy 0   3   ?Change in appetite 0   3   ?Feeling bad or failure about yourself  0   0   ?Trouble concentrating 0   1   ?Moving slowly or fidgety/restless 0   0   ?Suicidal thoughts 0   0   ?PHQ-9 Score 0   13   ?Difficult doing work/chores Not difficult at all   Very difficult   ? ? ?   ?Objective:  ?BP 122/80   Pulse 83   Wt 207 lb 12.8 oz (94.3 kg)   LMP 04/02/2011   BMI 34.58 kg/m?  ? ?BP 122/80   Pulse 83   Wt 207 lb 12.8 oz (94.3 kg)   LMP 04/02/2011   BMI 34.58 kg/m?  ? ?Wt Readings from Last 3 Encounters:  ?03/05/22 207 lb 12.8 oz (94.3 kg)  ?01/01/22 200 lb 6.4 oz (90.9 kg)  ?11/05/21 196 lb 12.8 oz (89.3 kg)  ? ?General appearance: alert, no distress, WD/WN, African American female ?Skin: Unremarkable ?HEENT: normocephalic, conjunctiva/corneas  normal, sclerae anicteric, PERRLA, EOMi, nares patent, no discharge or erythema, pharynx normal ?Oral cavity: MMM, tongue normal, teeth normal ?Neck: supple, no lymphadenopathy, no thyromegaly, no masses, normal ROM, no bruits ?Chest: non tender, normal shape and expansion ?Heart: RRR, normal S1, S2, no murmurs ?Lungs: CTA bilaterally, no wheezes, rhonchi, or rales ?Abdomen: +bs, soft, non tender, non distended, no masses, no hepatomegaly, no splenomegaly, no bruits ?Back: non tender, normal ROM, no scoliosis ?Musculoskeletal: upper extremities non tender, no obvious deformity, normal ROM throughout, lower extremities non tender, no obvious deformity, normal ROM throughout ?Extremities: no edema, no cyanosis, no clubbing ?Pulses: 2+ symmetric, upper and lower extremities,  normal cap refill ?Neurological: alert, oriented x 3, CN2-12 intact, strength normal upper extremities and lower extremities, sensation normal throughout, DTRs 2+ throughout, no cerebellar signs, gait normal ?Psychiatric: normal affect, behavior normal, pleasant  ?Breast/gyn/rectal - deferred to gynecology  ? ? ?Echocardiogram 10/2021 ?IMPRESSIONS ?Left ventricular ejection fraction, by estimation, is 60 to 65%. The left ventricle has ?normal function. The left ventricle has no regional wall motion abnormalities. Left ?ventricular diastolic parameters were normal. ?1. ?Right ventricular systolic function is normal. The right ventricular size is normal. ?Tricuspid regurgitation signal is inadequate for assessing PA pressure. ?2. ?The mitral valve is normal in structure. No evidence of mitral valve regurgitation. No ?evidence of mitral stenosis. ?3. ?The aortic valve is tricuspid. Aortic valve regurgitation is not visualized. Mild aortic ?valve sclerosis is present, with no evidence of aortic valve stenosis. ?4. ?The inferior vena cava is normal in size with greater ? ? ?CT coronary calcium test 07/30/21 ?OTHER FINDINGS: ?  ?Heart is normal size.  Aorta normal caliber. Calcifications ?throughout the descending thoracic aorta. No adenopathy. No ?confluent opacities or effusions. Imaging into the upper abdomen ?demonstrates no acute findings. Partially calcified nodule in the ?

## 2022-03-05 NOTE — Telephone Encounter (Signed)
PT AGREEABLE TO PLAN OF CARE FOR TELE PRE OP APPT 03/11/22 @ 10 AM. MED REC AND CONSENT ARE DONE, SEE SEPARATE PHONE NOTE.  ?

## 2022-03-05 NOTE — Telephone Encounter (Signed)
Preoperative team, please contact requesting office and asked them to submit a formal preoperative cardiac clearance request.  Once we review formal clearance request, we will be able to provide recommendations for her upcoming surgery.  Thank you. ? ?Jossie Ng. Amairani Shuey NP-C ? ?  ?03/05/2022, 9:34 AM ?[place] ?[address] ?Office [phone] Fax [phone] ? ?

## 2022-03-05 NOTE — Telephone Encounter (Signed)
I S/W SHERRY WITH DR. Reather Littler OFFICE AND OBTAINED SURGERY INFO.  ? ? ? ?Pre-operative Risk Assessment  ?  ?Patient Name: Paula Meyer  ?DOB: 09-Aug-1951 ?MRN: [id number]  ? ?  ? ?Request for Surgical Clearance   ? ?Procedure:   L5-S1 LUMBAR DECOMPRESSION ? ?Date of Surgery:  Clearance TBD                              ?   ?Surgeon:  DR. Susa Day ?Surgeon's Group or Practice Name:  [facility] ?Phone number:  [phone] ?Fax number:  [phone] ATTN: [place] ?  ?Type of Clearance Requested:   ?- Medical  ?- Pharmacy:  Hold Aspirin ( LOOKS TO BE THAT ASA IS MANAGED BY PCP) ?  ?Type of Anesthesia:  General  ?  ?Additional requests/questions:   ? ?Signed, ?Julaine Hua   ?03/05/2022, 10:38 AM  ? ?

## 2022-03-06 ENCOUNTER — Encounter: Payer: Self-pay | Admitting: Medical

## 2022-03-11 ENCOUNTER — Ambulatory Visit (INDEPENDENT_AMBULATORY_CARE_PROVIDER_SITE_OTHER): Payer: Medicare Other | Admitting: Physician Assistant

## 2022-03-11 ENCOUNTER — Other Ambulatory Visit: Payer: Self-pay

## 2022-03-11 DIAGNOSIS — Z01810 Encounter for preprocedural cardiovascular examination: Secondary | ICD-10-CM

## 2022-03-11 NOTE — Progress Notes (Signed)
? ?Virtual Visit via Telephone Note  ? ?This visit type was conducted due to national recommendations for restrictions regarding the COVID-19 Pandemic (e.g. social distancing) in an effort to limit this patient's exposure and mitigate transmission in our community.  Due to her co-morbid illnesses, this patient is at least at moderate risk for complications without adequate follow up.  This format is felt to be most appropriate for this patient at this time.  The patient did not have access to video technology/had technical difficulties with video requiring transitioning to audio format only (telephone).  All issues noted in this document were discussed and addressed.  No physical exam could be performed with this format.  Please refer to the patient's chart for her  consent to telehealth for [facility]. ? ?Evaluation Performed:  Preoperative cardiovascular risk assessment ?_____________  ? ?Date:  03/11/2022  ? ?Patient ID:  Meyer, Paula 10-27-1951, MRN [id number] ?Patient Location:  ?Home ?Provider location:   ?Office ? ?Primary Care Provider:  Carlena Hurl, PA-C ?Primary Cardiologist:  Evalina Field, MD ? ?Chief Complaint  ?  ?71 y.o. y/o female with a h/o hypertension, hyperlipidemia, and elevated coronary calcium score, who is pending L5-S1 lumbar decompression by Dr. Susa Day, and presents today for telephonic preoperative cardiovascular risk assessment. ? ?Past Medical History  ?  ?Past Medical History:  ?Diagnosis Date  ? Allergy   ? Anxiety   ? Aortic atherosclerosis ([place]) 2022  ? thoracic  ? Arthritis   ? Asthma   ? CAD (coronary artery disease)   ? Coronary artery calcification seen on CT scan 07/2021  ? Depression   ? Hx of colonic polyps   ? Hyperlipidemia   ? Hypertension   ? Osteoarthritis   ? Psoriatic arthritis ([place])   ? ?Past Surgical History:  ?Procedure Laterality Date  ? COLONOSCOPY  2018  ? TUBAL LIGATION    ? WISDOM TOOTH EXTRACTION    ? ? ?Allergies ? ?Allergies   ?Allergen Reactions  ? Paxil [Paroxetine Hcl]   ?  Makes her grind her teeth  ? Bupropion Hcl Other (See Comments)  ?  palpitations  ? Contrast Media [Iodinated Contrast Media] Hives and Itching  ? Evac [Psyllium]   ? Lisinopril   ?  cough  ? Tramadol   ?  Dependency, avoid narcotics  ? ? ?History of Present Illness  ?  ?Paula Meyer is a 71 y.o. female who presents via audio/video conferencing for a telehealth visit today.  Pt was last seen in cardiology clinic on 10/02/2021 by Dr. Audie Box.  At that time [name] was doing well.  The patient is now pending L5-S1 lumbar decompression surgery by Dr. Susa Day.  Since her last visit, she has been doing well.  Myoview obtained on 10/21/2021 showed EF 67%, no ischemia.  Echocardiogram obtained on the same day showed EF 60 to 65%, no regional wall motion abnormality, no significant valve issue. ? ? ?Home Medications  ?  ?Prior to Admission medications   ?Medication Sig Start Date End Date Taking? Authorizing Provider  ?albuterol (VENTOLIN HFA) 108 (90 Base) MCG/ACT inhaler INHALE 2 PUFFS INTO THE LUNGS EVERY 4 HOURS AS NEEDED FOR COUGH, WHEEZING OR SHORTNESS OF BREATH 10/16/21   Tysinger, Camelia Eng, PA-C  ?Alirocumab (PRALUENT) 75 MG/ML SOAJ Inject 75 mg into the skin every 14 (fourteen) days. 01/30/22   Tysinger, Camelia Eng, PA-C  ?aspirin EC 81 MG tablet Take 1 tablet (81 mg total) by  mouth daily. 01/02/22   Tysinger, Camelia Eng, PA-C  ?atorvastatin (LIPITOR) 20 MG tablet Take 1 tablet (20 mg total) by mouth daily. 01/02/22 01/02/23  Tysinger, Camelia Eng, PA-C  ?Azelastine-Fluticasone 137-50 MCG/ACT SUSP PLACE 1 SPRAY INTO THE NOSE 2 (TWO) TIMES DAILY AS NEEDED. 02/05/21   Just, Laurita Quint, FNP  ?Cholecalciferol (VITAMIN D-3) 125 MCG (5000 UT) TABS Take by mouth.    [provider]  ?Coenzyme Q10 (COQ10 PO) Take 120 mg by mouth.    [provider]  ?diclofenac sodium (VOLTAREN) 1 % GEL Apply 2 g topically 4 (four) times daily. 10/10/19   Daleen Squibb, MD  ?gabapentin (NEURONTIN) 300 MG capsule Take 600 mg by mouth 3 (three) times daily. 02/12/22   [provider]  ?golimumab ([place] ARIA) 50 MG/4ML SOLN injection     [provider]  ?hydrochlorothiazide (HYDRODIURIL) 25 MG tablet Take 1 tablet (25 mg total) by mouth daily. 10/03/21   Tysinger, Camelia Eng, PA-C  ?HYDROcodone-acetaminophen (NORCO/VICODIN) 5-325 MG tablet Take 2 tablets by mouth 3 (three) times daily as needed. 03/03/22   [provider]  ?POTASSIUM CHLORIDE PO Take 2 tablets by mouth daily.    [provider]  ?TURMERIC PO Take 1 tablet by mouth daily.    [provider]  ? ? ?Physical Exam  ?  ?Vital Signs:  MITHRA SPANO does not have vital signs available for review today. ? ?Given telephonic nature of communication, physical exam is limited. ?AAOx3. NAD. Normal affect.  Speech and respirations are unlabored. ? ?Accessory Clinical Findings  ?  ?None ? ?Assessment & Plan  ?  ?1.  Preoperative Cardiovascular Risk Assessment: ? -Patient has upcoming L5-S1 lumbar decompression surgery.  Due to her elevated calcium score, stress test and echocardiogram were performed in November 2022, both of which were normal.  Recently, she has been having few seconds of tingling sensation in the chest after starting on gabapentin, since stopping gabapentin, she has not had any further symptoms in the past 2 days.  The tingling sensation in the chest only last for few seconds each day and it does not occur with physical activity.  The symptom does not sound cardiac in nature.  From the cardiac perspective, she may proceed with back surgery as a low risk patient for moderate risk surgery.  If needed, she may hold aspirin for 7 days prior to the procedure and restart as soon as possible afterward at the surgeon's discretion. ? ? ?A copy of this note will be routed to requesting surgeon. ? ?Time:   ?Today, I have spent 20 minutes with the patient with telehealth technology  discussing medical history, symptoms, and management plan.   ? ? ?Almyra Deforest, [place] ? ?03/11/2022, 10:27 AM  ?

## 2022-03-12 NOTE — Addendum Note (Signed)
Addended by: Minette Headland A on: 03/12/2022 09:40 AM ? ? Modules accepted: Orders ? ?

## 2022-03-13 ENCOUNTER — Encounter: Payer: Self-pay | Admitting: Medical

## 2022-03-16 ENCOUNTER — Ambulatory Visit: Payer: Self-pay | Admitting: Orthopedic Surgery

## 2022-03-16 DIAGNOSIS — L4050 Arthropathic psoriasis, unspecified: Secondary | ICD-10-CM | POA: Diagnosis not present

## 2022-03-19 DIAGNOSIS — M549 Dorsalgia, unspecified: Secondary | ICD-10-CM | POA: Diagnosis not present

## 2022-03-19 DIAGNOSIS — M5459 Other low back pain: Secondary | ICD-10-CM | POA: Diagnosis not present

## 2022-03-19 DIAGNOSIS — Z79899 Other long term (current) drug therapy: Secondary | ICD-10-CM | POA: Diagnosis not present

## 2022-03-19 DIAGNOSIS — L4050 Arthropathic psoriasis, unspecified: Secondary | ICD-10-CM | POA: Diagnosis not present

## 2022-03-19 DIAGNOSIS — L409 Psoriasis, unspecified: Secondary | ICD-10-CM | POA: Diagnosis not present

## 2022-03-19 DIAGNOSIS — T148XXA Other injury of unspecified body region, initial encounter: Secondary | ICD-10-CM | POA: Diagnosis not present

## 2022-03-19 DIAGNOSIS — M79643 Pain in unspecified hand: Secondary | ICD-10-CM | POA: Diagnosis not present

## 2022-03-19 DIAGNOSIS — M5416 Radiculopathy, lumbar region: Secondary | ICD-10-CM | POA: Diagnosis not present

## 2022-03-19 DIAGNOSIS — M1990 Unspecified osteoarthritis, unspecified site: Secondary | ICD-10-CM | POA: Diagnosis not present

## 2022-03-19 DIAGNOSIS — R768 Other specified abnormal immunological findings in serum: Secondary | ICD-10-CM | POA: Diagnosis not present

## 2022-03-31 NOTE — Progress Notes (Addendum)
Surgical Instructions ? ? ? Your procedure is scheduled on Thursday, April 27th, 2023. ? ? Report to [facility] Main Entrance "A" at 08:45 A.M., then check in with the Admitting office. ? Call this number if you have problems the morning of surgery: ? [phone] ? ? If you have any questions prior to your surgery date call [phone]: Open Monday-Friday 8am-4pm ? ? ? Remember: ? Do not eat after midnight the night before your surgery ? ?You may drink clear liquids until 07:45 the morning of your surgery.   ?Clear liquids allowed are: Water, Non-Citrus Juices (without pulp), Carbonated Beverages, Clear Tea, Black Coffee ONLY (NO MILK, CREAM OR POWDERED CREAMER of any kind), and Gatorade ? ?Patient Instructions ? ?The night before surgery:  ?No food after midnight. ONLY clear liquids after midnight ? ?The day of surgery (if you do NOT have diabetes):  ?Drink ONE (1) Pre-Surgery Clear Ensure by 07:45 the morning of surgery. Drink in one sitting. Do not sip.  ?This drink was given to you during your hospital  ?pre-op appointment visit. ? ?Nothing else to drink after completing the  ?Pre-Surgery Clear Ensure. ? ? ?       If you have questions, please contact your surgeon?s office.  ?  ? Take these medicines the morning of surgery with A SIP OF WATER:  ? ?atorvastatin (LIPITOR) ?fexofenadine (ALLEGRA)  ? ?If needed: ? ?acetaminophen (TYLENOL)  ?albuterol (VENTOLIN HFA) ?Azelastine-Fluticasone  ?HYDROcodone-acetaminophen (NORCO/VICODIN)  ?naphazoline-pheniramine (NAPHCON-A) ? ?Follow your surgeon's instructions on when to stop Aspirin.  If no instructions were given by your surgeon then you will need to call the office to get those instructions.    ? ?As of today, STOP taking any Aspirin (unless otherwise instructed by your surgeon) Aleve, Naproxen, Ibuprofen, Motrin, Advil, Goody's, BC's, all herbal medications, fish oil, and all vitamins. ? ? ? The day of surgery: ?         ?Do not wear jewelry or makeup ?Do not wear  lotions, powders, perfumes, or deodorant. ?Do not bring valuables to the hospital. ?Do not wear nail polish, gel polish, artificial nails, or any other type of covering on natural nails (fingers and toes) ?If you have artificial nails or gel coating that need to be removed by a nail salon, please have this removed prior to surgery. Artificial nails or gel coating may interfere with anesthesia's ability to adequately monitor your vital signs. ? ? ?[facility] is not responsible for any belongings or valuables. .  ? ?Do NOT Smoke (Tobacco/Vaping)  24 hours prior to your procedure ? ?If you use a CPAP at night, you may bring your mask for your overnight stay. ?  ?Contacts, glasses, hearing aids, dentures or partials may not be worn into surgery, please bring cases for these belongings ?  ?For patients admitted to the hospital, discharge time will be determined by your treatment team. ?  ?Patients discharged the day of surgery will not be allowed to drive home, and someone needs to stay with them for 24 hours. ? ? ?SURGICAL WAITING ROOM VISITATION ?Patients having surgery or a procedure in a hospital may have two support people. ?Children under the age of 25 must have an adult with them who is not the patient. ?They may stay in the waiting area during the procedure and may switch out with other visitors. If the patient needs to stay at the hospital during part of their recovery, the visitor guidelines for inpatient rooms apply. ? ?Please refer  to the [employer] website for the visitor guidelines for Inpatients (after your surgery is over and you are in a regular room).  ? ? ?Special instructions:   ? ?Oral Hygiene is also important to reduce your risk of infection.  Remember - BRUSH YOUR TEETH THE MORNING OF SURGERY WITH YOUR REGULAR TOOTHPASTE ? ? ?[facility]- Preparing For Surgery ? ?Before surgery, you can play an important role. Because skin is not sterile, your skin needs to be as free of germs as possible. You  can reduce the number of germs on your skin by washing with CHG (chlorahexidine gluconate) Soap before surgery.  CHG is an antiseptic cleaner which kills germs and bonds with the skin to continue killing germs even after washing.   ? ? ?Please do not use if you have an allergy to CHG or antibacterial soaps. If your skin becomes reddened/irritated stop using the CHG.  ?Do not shave (including legs and underarms) for at least 48 hours prior to first CHG shower. It is OK to shave your face. ? ?Please follow these instructions carefully. ?  ? ? Shower the NIGHT BEFORE SURGERY and the MORNING OF SURGERY with CHG Soap.  ? If you chose to wash your hair, wash your hair first as usual with your normal shampoo. After you shampoo, rinse your hair and body thoroughly to remove the shampoo.  Then [employer] and genitals (private parts) with your normal soap and rinse thoroughly to remove soap. ? ?After that Use CHG Soap as you would any other liquid soap. You can apply CHG directly to the skin and wash gently with a scrungie or a clean washcloth.  ? ?Apply the CHG Soap to your body ONLY FROM THE NECK DOWN.  Do not use on open wounds or open sores. Avoid contact with your eyes, ears, mouth and genitals (private parts). Wash Face and genitals (private parts)  with your normal soap.  ? ?Wash thoroughly, paying special attention to the area where your surgery will be performed. ? ?Thoroughly rinse your body with warm water from the neck down. ? ?DO NOT shower/wash with your normal soap after using and rinsing off the CHG Soap. ? ?Pat yourself dry with a CLEAN TOWEL. ? ?Wear CLEAN PAJAMAS to bed the night before surgery ? ?Place CLEAN SHEETS on your bed the night before your surgery ? ?DO NOT SLEEP WITH PETS. ? ? ?Day of Surgery: ? ?Take a shower with CHG soap. ?Wear Clean/Comfortable clothing the morning of surgery ?Do not apply any deodorants/lotions.   ?Remember to brush your teeth WITH YOUR REGULAR TOOTHPASTE. ? ? ? ?If you  received a COVID test during your pre-op visit  it is requested that you wear a mask when out in public, stay away from anyone that may not be feeling well and notify your surgeon if you develop symptoms. If you have been in contact with anyone that has tested positive in the last 10 days please notify you surgeon. ? ?  ?Please read over the following fact sheets that you were given.   ?

## 2022-04-01 ENCOUNTER — Encounter (HOSPITAL_COMMUNITY): Payer: Self-pay

## 2022-04-01 ENCOUNTER — Ambulatory Visit (HOSPITAL_COMMUNITY)
Admission: RE | Admit: 2022-04-01 | Discharge: 2022-04-01 | Disposition: A | Discharge status: Home / Self Care | Payer: Medicare Other | Source: Ambulatory Visit | Attending: Orthopedic Surgery | Admitting: Orthopedic Surgery

## 2022-04-01 ENCOUNTER — Encounter (HOSPITAL_COMMUNITY)
Admission: RE | Admit: 2022-04-01 | Discharge: 2022-04-01 | Disposition: A | Discharge status: Home / Self Care | Payer: Medicare Other | Source: Ambulatory Visit | Attending: Specialist | Admitting: Specialist

## 2022-04-01 ENCOUNTER — Other Ambulatory Visit: Payer: Self-pay

## 2022-04-01 VITALS — BP 158/84 | HR 77 | Temp 98.3°F | Resp 17 | Ht 65.0 in | Wt 204.0 lb

## 2022-04-01 DIAGNOSIS — I10 Essential (primary) hypertension: Secondary | ICD-10-CM | POA: Insufficient documentation

## 2022-04-01 DIAGNOSIS — M5126 Other intervertebral disc displacement, lumbar region: Secondary | ICD-10-CM | POA: Diagnosis not present

## 2022-04-01 DIAGNOSIS — Z01818 Encounter for other preprocedural examination: Secondary | ICD-10-CM

## 2022-04-01 DIAGNOSIS — I700 Atherosclerosis of aorta: Secondary | ICD-10-CM | POA: Diagnosis not present

## 2022-04-01 DIAGNOSIS — M47816 Spondylosis without myelopathy or radiculopathy, lumbar region: Secondary | ICD-10-CM | POA: Diagnosis not present

## 2022-04-01 DIAGNOSIS — M439 Deforming dorsopathy, unspecified: Secondary | ICD-10-CM | POA: Diagnosis not present

## 2022-04-01 HISTORY — DX: Cardiac murmur, unspecified: R01.1

## 2022-04-01 HISTORY — DX: Anemia, unspecified: D64.9

## 2022-04-01 HISTORY — DX: Noninfective gastroenteritis and colitis, unspecified: K52.9

## 2022-04-01 LAB — BASIC METABOLIC PANEL
Anion gap: 7 (ref 5–15)
BUN: 15 mg/dL (ref 8–23)
CO2: 29 mmol/L (ref 22–32)
Calcium: 9.5 mg/dL (ref 8.9–10.3)
Chloride: 106 mmol/L (ref 98–111)
Creatinine, Ser: 0.84 mg/dL (ref 0.44–1.00)
GFR, Estimated: >60 mL/min (ref >60)
Glucose, Bld: 101 mg/dL — ABNORMAL HIGH (ref 70–99)
Potassium: 3.5 mmol/L (ref 3.5–5.1)
Sodium: 142 mmol/L (ref 135–145)

## 2022-04-01 LAB — SURGICAL PCR SCREEN

## 2022-04-01 LAB — CBC
HCT: 35.4 % — ABNORMAL LOW (ref 36.0–46.0)
Hemoglobin: 11.3 g/dL — ABNORMAL LOW (ref 12.0–15.0)
MCH: 27.6 pg (ref 26.0–34.0)
MCHC: 31.9 g/dL (ref 30.0–36.0)
MCV: 86.6 fL (ref 80.0–100.0)
Platelets: 186 10*3/uL (ref 150–400)
RBC: 4.09 MIL/uL (ref 3.87–5.11)
RDW: 13 % (ref 11.5–15.5)
WBC: 3.5 10*3/uL — ABNORMAL LOW (ref 4.0–10.5)
nRBC: 0 % (ref 0.0–0.2)

## 2022-04-01 NOTE — Progress Notes (Addendum)
PCP - Chana Bode PA ?Cardiologist - [place] ? ?Chest x-ray - R lumbar spine ?EKG - 10/02/21 ?Stress Test -10/21/21  ?ECHO - 10/21/21 ?Cardiac Cath - No ? ?Sleep Study - Years ago "maybe 20 years ago" I think she said I had OSA and wore CPAP for a while then stopped ?CPAP - Not using ? ?DM - Denies ? ?Aspirin Instructions: Per patient the heart Dr. Audie Box And Dr. Tonita Cong instructed her to stop 7 days prior to surgery ? ?ERAS Protcol - Yes ?PRE-SURGERY Ensure  ? ?Anesthesia review: Yes cardiac history ? ?Patient denies shortness of breath, fever, cough and chest pain at PAT appointment ? ? ?All instructions explained to the patient, with a verbal understanding of the material. Patient agrees to go over the instructions while at home for a better understanding.  The opportunity to ask questions was provided. ? ? ?04/01/22 1337 ?Surgical PCR was invalid per [place] in lab.  ? ?Will need to recollect day of surgery.  ? ? ?

## 2022-04-02 NOTE — Progress Notes (Signed)
Anesthesia Chart Review: ? ?Follows with cardiology for hx of hypertension, hyperlipidemia, and elevated coronary calcium score. Seen by Almyra Deforest, PA-C via televisit 03/11/22 for preop risk eval. Per note, " -Patient has upcoming L5-S1 lumbar decompression surgery.  Due to her elevated calcium score, stress test and echocardiogram were performed in November 2022, both of which were normal.  Recently, she has been having few seconds of tingling sensation in the chest after starting on gabapentin, since stopping gabapentin, she has not had any further symptoms in the past 2 days.  The tingling sensation in the chest only last for few seconds each day and it does not occur with physical activity.  The symptom does not sound cardiac in nature.  From the cardiac perspective, she may proceed with back surgery as a low risk patient for moderate risk surgery.  If needed, she may hold aspirin for 7 days prior to the procedure and restart as soon as possible afterward at the surgeon's discretion." ? ?Hx of OSA. Pt does not use CPAP. ? ?Recent spirometry 03/05/22 was normal. ? ?Preop labs reviewed, mild anemia with hgb 11.3, otherwise unremarkable.  ? ?EKG 10/02/21: NSR. Rate 68. ST & T wave abnormality, consider inferior ischemia. ST & T wave abnormality, consider anterior ischemia.  ? ?Nuclear stress 10/21/21: ?  The study is normal. The study is low risk. ?  No ST deviation was noted. ?  LV perfusion is normal. There is no evidence of ischemia. There is no evidence of infarction. ?  Left ventricular function is normal. Nuclear stress EF: 67 %. The left ventricular ejection fraction is hyperdynamic (>65%). End diastolic cavity size is normal. End systolic cavity size is normal. ?  Prior study not available for comparison. ?  ?Negative Lexiscan myoview  ? ?TTE 10/21/21: ? 1. Left ventricular ejection fraction, by estimation, is 60 to 65%. The  ?left ventricle has normal function. The left ventricle has no regional  ?wall motion  abnormalities. Left ventricular diastolic parameters were  ?normal.  ? 2. Right ventricular systolic function is normal. The right ventricular  ?size is normal. Tricuspid regurgitation signal is inadequate for assessing  ?PA pressure.  ? 3. The mitral valve is normal in structure. No evidence of mitral valve  ?regurgitation. No evidence of mitral stenosis.  ? 4. The aortic valve is tricuspid. Aortic valve regurgitation is not  ?visualized. Mild aortic valve sclerosis is present, with no evidence of  ?aortic valve stenosis.  ? 5. The inferior vena cava is normal in size with greater than 50%  ?respiratory variability, suggesting right atrial pressure of 3 mmHg.  ? ?Comparison(s): Prior images unable to be directly viewed, comparison made  ?by report only.  ? ? ?Karoline Caldwell, PA-C ?[facility] Short Stay Center/Anesthesiology ?Phone [phone] ?04/02/2022 3:23 PM ? ?

## 2022-04-02 NOTE — Anesthesia Preprocedure Evaluation (Addendum)
Anesthesia Evaluation  ?Patient identified by MRN, date of birth, ID band ?Patient awake ? ? ? ?Reviewed: ?Allergy & Precautions, NPO status , Patient's Chart, lab work & pertinent test results ? ?Airway ?Mallampati: II ? ?TM Distance: >3 FB ?Neck ROM: Full ? ? ? Dental ?no notable dental hx. ? ?  ?Pulmonary ?asthma , former smoker,  ?  ?Pulmonary exam normal ?breath sounds clear to auscultation ? ? ? ? ? ? Cardiovascular ?hypertension, Pt. on medications ?+ CAD  ?Normal cardiovascular exam ?Rhythm:Regular Rate:Normal ? ? ?  ?Neuro/Psych ?Anxiety Depression negative neurological ROS ? negative psych ROS  ? GI/Hepatic ?Neg liver ROS, GERD  ,  ?Endo/Other  ?negative endocrine ROS ? Renal/GU ?negative Renal ROS  ?negative genitourinary ?  ?Musculoskeletal ? ?(+) Arthritis , Osteoarthritis,   ? Abdominal ?(+) + obese,   ?Peds ?negative pediatric ROS ?(+)  Hematology ?negative hematology ROS ?(+)   ?Anesthesia Other Findings ? ? Reproductive/Obstetrics ?negative OB ROS ? ?  ? ? ? ? ? ? ? ? ? ? ? ? ? ?  ?  ? ? ? ? ? ? ? ?Anesthesia Physical ?Anesthesia Plan ? ?ASA: 2 ? ?Anesthesia Plan: General  ? ?Post-op Pain Management:   ? ?Induction: Intravenous ? ?PONV Risk Score and Plan: 3 and Ondansetron, Dexamethasone, Midazolam and Treatment may vary due to age or medical condition ? ?Airway Management Planned: Oral ETT ? ?Additional Equipment:  ? ?Intra-op Plan:  ? ?Post-operative Plan: Extubation in OR ? ?Informed Consent: I have reviewed the patients History and Physical, chart, labs and discussed the procedure including the risks, benefits and alternatives for the proposed anesthesia with the patient or authorized representative who has indicated his/her understanding and acceptance.  ? ? ? ?Dental advisory given ? ?Plan Discussed with: CRNA ? ?Anesthesia Plan Comments: (PAT note by Karoline Caldwell, PA-C: ?Follows with cardiology for hx of hypertension, hyperlipidemia, and elevated coronary  calcium score. Seen by Almyra Deforest, PA-C via televisit 03/11/22 for preop risk eval. Per note, "?-Patient has upcoming L5-S1 lumbar decompression surgery. ?Due to her elevated calcium score, stress test and echocardiogram were performed in November 2022, both of which were normal. ?Recently, she has been having few seconds of tingling sensation in the chest after starting on gabapentin, since stopping gabapentin, she has not had any further symptoms in the past 2 days. ?The tingling sensation in the chest only last for few seconds each day and it does not occur with physical activity. ?The symptom does not sound cardiac in nature. ?From the cardiac perspective, she may proceed with back surgery as a low risk patient for moderate risk surgery. ?If needed, she may hold aspirin for 7 days prior to the procedure and restart as soon as?possible afterward at the surgeon's discretion." ? ?Hx of OSA. Pt does not use CPAP. ? ?Recent spirometry 03/05/22 was normal. ? ?Preop labs reviewed, mild anemia with hgb 11.3, otherwise unremarkable.  ? ?EKG 10/02/21: NSR. Rate 68. ST & T wave abnormality, consider inferior ischemia. ST & T wave abnormality, consider anterior ischemia.  ? ?Nuclear stress 10/21/21: ?? ?The study is normal. The study is low risk. ?? ?No ST deviation was noted. ?? ?LV perfusion is normal. There is no evidence of ischemia. There is no evidence of infarction. ?? ?Left ventricular function is normal. Nuclear stress EF: 67 %. The left ventricular ejection fraction is hyperdynamic (>65%). End diastolic cavity size is normal. End systolic cavity size is normal. ?? ?Prior study not available for comparison. ?? ?  Negative Lexiscan myoview  ? ?TTE 10/21/21: ??1. Left ventricular ejection fraction, by estimation, is 60 to 65%. The  ?left ventricle has normal function. The left ventricle has no regional  ?wall motion abnormalities. Left ventricular diastolic parameters were  ?normal.  ??2. Right ventricular systolic function  is normal. The right ventricular  ?size is normal. Tricuspid regurgitation signal is inadequate for assessing  ?PA pressure.  ??3. The mitral valve is normal in structure. No evidence of mitral valve  ?regurgitation. No evidence of mitral stenosis.  ??4. The aortic valve is tricuspid. Aortic valve regurgitation is not  ?visualized. Mild aortic valve sclerosis is present, with no evidence of  ?aortic valve stenosis.  ??5. The inferior vena cava is normal in size with greater than 50%  ?respiratory variability, suggesting right atrial pressure of 3 mmHg.  ? ?Comparison(s): Prior images unable to be directly viewed, comparison made  ?by report only.  ?)  ? ? ? ? ? ?Anesthesia Quick Evaluation ? ?

## 2022-04-03 ENCOUNTER — Ambulatory Visit: Payer: Medicare Other | Admitting: Medical

## 2022-04-03 ENCOUNTER — Encounter (HOSPITAL_BASED_OUTPATIENT_CLINIC_OR_DEPARTMENT_OTHER): Payer: Self-pay | Admitting: Cardiovascular Disease

## 2022-04-06 ENCOUNTER — Telehealth: Payer: Self-pay | Admitting: Medical

## 2022-04-06 ENCOUNTER — Other Ambulatory Visit: Payer: Self-pay | Admitting: Medical

## 2022-04-06 MED ORDER — ALPRAZOLAM 0.5 MG PO TABS: 0.5000 mg | ORAL_TABLET | Freq: Two times a day (BID) | ORAL | 0 refills | Status: DC | PRN

## 2022-04-06 NOTE — Telephone Encounter (Signed)
Pt called and states that her mother passed away and she is leaving for [facility] in the next hour. She is requesting something to help with her nerves. Pt uses [facility] [address]. She can be reached at [phone]. Pt did states that she sent a mychart message last night but I do not see in chart,  ?

## 2022-04-08 ENCOUNTER — Ambulatory Visit: Payer: Self-pay | Admitting: Orthopedic Surgery

## 2022-04-08 NOTE — H&P (View-Only) (Signed)
Paula Meyer is an 71 y.o. female.   ?Chief Complaint: back and leg pain ?HPI: Quality: aching; pinching ?Severity: pain level 4/10 ?Duration: 3 months ?Alleviating Factors: chiropractic care; Gabapentin 300 2 tid and Hydrocodone up to tid ?Aggravating Factors: sitting ?Associated Symptoms: radiation down leg (right buttock and thigh) ?Previous Surgery: none ?Prior Imaging: x ray; MRI ?Previous Injections: helped a little ?Previous PT: helped a little ?Notes: ?Evaluation and treatment by Dr. Nelva Bush. Patient was prescribed Tramadol, Gabapentin, and Hydrocodone. Injections include right ischial tuberosity, R L5-S1 ESI, and R S1 SNRB. ?Referred by Dr. Nelva Bush ? ?Patient has had 2 MRIs. The most recent 1 demonstrated a paracentral disc herniation L5-S1 to the right. She is undergone physical therapy. Activity modifications. Chiropractic decompression. And is requiring analgesics that include gabapentin 3 times a day as well as hydrocodone. No fevers or chills change in bowel or bladder function. ? ?Past Medical History:  ?Diagnosis Date  ? Allergy   ? Anemia   ? Anxiety   ? Aortic atherosclerosis ([place]) 2022  ? thoracic  ? Arthritis   ? Asthma   ? CAD (coronary artery disease)   ? Colitis   ? Coronary artery calcification seen on CT scan 07/2021  ? Depression   ? Heart murmur   ? Hx of colonic polyps   ? Hyperlipidemia   ? Hypertension   ? Osteoarthritis   ? Psoriatic arthritis ([place])   ? ? ?Past Surgical History:  ?Procedure Laterality Date  ? COLONOSCOPY  2018  ? TUBAL LIGATION    ? WISDOM TOOTH EXTRACTION    ? ? ?Family History  ?Problem Relation Age of Onset  ? Heart attack Brother   ? Hyperlipidemia Brother   ? Hypertension Brother   ? Heart disease Brother   ? Arthritis Other   ? Depression Other   ? Colon cancer Neg Hx   ? Stomach cancer Neg Hx   ? ?Social History:  reports that she quit smoking about 34 years ago. Her smoking use included cigarettes. She has never used smokeless tobacco. She reports that she does  not currently use alcohol after a past usage of about 3.0 standard drinks per week. She reports that she does not use drugs. ? ?Allergies:  ?Allergies  ?Allergen Reactions  ? Paxil [Paroxetine Hcl]   ?  Makes her grind her teeth  ? Bupropion Hcl Other (See Comments)  ?  palpitations  ? Contrast Media [Iodinated Contrast Media] Hives and Itching  ? Lisinopril   ?  cough  ? Tramadol   ?  Dependency, avoid narcotics  ? ?Current meds: ?albuterol sulfate HFA 90 mcg/actuation aerosol inhaler ?aspirin 81 mg tablet,delayed release ?atorvastatin 20 mg tablet ?azelastine 205.5 mcg (0.15 %) nasal spray ?azelastine-fluticasone 137 mcg-50 mcg/spray nasal spray ?diclofenac 1 % topical gel ?Flovent HFA 110 mcg/actuation aerosol inhaler ?gabapentin 300 mg capsule ?hydroCHLOROthiazide 25 mg tablet ?HYDROcodone 5 mg-acetaminophen 325 mg tablet ?Praluent Pen 75 mg/mL subcutaneous pen injector ? ? ?Review of Systems  ?Constitutional: Negative.   ?HENT: Negative.    ?Eyes: Negative.   ?Respiratory: Negative.    ?Cardiovascular: Negative.   ?Gastrointestinal: Negative.   ?Endocrine: Negative.   ?Genitourinary: Negative.   ?Musculoskeletal:  Positive for back pain and gait problem.  ?Skin: Negative.   ?Allergic/Immunologic: Negative.   ?Neurological:  Positive for weakness and numbness.  ?Psychiatric/Behavioral: Negative.    ? ?Last menstrual period 04/02/2011. ?Physical Exam ?Constitutional:   ?   Appearance: Normal appearance.  ?HENT:  ?  Head: Normocephalic and atraumatic.  ?   Right Ear: External ear normal.  ?   Left Ear: External ear normal.  ?   Nose: Nose normal.  ?   Mouth/Throat:  ?   Pharynx: Oropharynx is clear.  ?Eyes:  ?   Conjunctiva/sclera: Conjunctivae normal.  ?Cardiovascular:  ?   Rate and Rhythm: Normal rate and regular rhythm.  ?   Pulses: Normal pulses.  ?Pulmonary:  ?   Effort: Pulmonary effort is normal.  ?Abdominal:  ?   General: Bowel sounds are normal.  ?Musculoskeletal:  ?   Cervical back: Normal range of  motion.  ?   Comments: Gait and Station: Appearance: ambulating with no assistive devices and antalgic gait. ? ?Constitutional: General Appearance: healthy-appearing and distress (mild). ? ?Psychiatric: Mood and Affect: active and alert. ? ?Cardiovascular System: Edema Right: none; Dorsalis and posterior tibial pulses 2+. Edema Left: none. ? ?Abdomen: Inspection and Palpation: non-distended and no tenderness. ? ?Skin: Inspection and palpation: no rash. ? ?Lumbar Spine: Inspection: normal alignment. Bony Palpation of the Lumbar Spine: tender at lumbosacral junction.. Bony Palpation of the Right Hip: no tenderness of the greater trochanter and tenderness of the SI joint; Pelvis stable. Bony Palpation of the Left Hip: no tenderness of the greater trochanter and tenderness of the SI joint. Soft Tissue Palpation on the Right: No flank pain with percussion. Active Range of Motion: limited flexion and extention. ? ?Motor Strength: L1 Motor Strength on the Right: hip flexion iliopsoas 5/5. L1 Motor Strength on the Left: hip flexion iliopsoas 5/5. L2-L4 Motor Strength on the Right: knee extension quadriceps 5/5. L2-L4 Motor Strength on the Left: knee extension quadriceps 5/5. L5 Motor Strength on the Right: ankle dorsiflexion tibialis anterior 5/5 and great toe extension extensor hallucis longus 5/5. L5 Motor Strength on the Left: ankle dorsiflexion tibialis anterior 5/5 and great toe extension extensor hallucis longus 5/5. S1 Motor Strength on the Right: plantar flexion gastrocnemius 5/5. S1 Motor Strength on the Left: plantar flexion gastrocnemius 5/5. ? ?Neurological System: Knee Reflex Right: normal (2). Knee Reflex Left: normal (2). Ankle Reflex Right: normal (2). Ankle Reflex Left: normal (2). Babinski Reflex Right: plantar reflex absent. Babinski Reflex Left: plantar reflex absent. Sensation on the Right: normal distal extremities. Sensation on the Left: normal distal extremities. Special Tests on the Right: no clonus  of the ankle/knee and seated straight leg raising test positive. Special Tests on the Left: no clonus of the ankle/knee. ? ?Patient has diminished repetitive plantarflexion on the right. Slightly decreased Achilles reflex  ?Skin: ?   General: Skin is warm and dry.  ?Neurological:  ?   Mental Status: She is alert.  ?  ?.Three-view x-rays lumbar spine demonstrate disc base narrowing L5-S1. No instability in flexion extension. ? ?Most recent MRI demonstrates a paracentral disc herniation L5-S1 to the right displacing the S1 nerve root. There are mild degenerative changes at L4-5. ? ?2 view flexion-extension x-rays today demonstrate no evidence of instability ? ?Assessment/Plan ?Impression: ? ?1. Refractory S1 radiculopathy due to disc herniation progressive at L5-S1. No help from selective nerve root blocks physical therapy analgesics and activity modification with neurotension signs and diminished repetitive plantarflexion ? ?Plan: ? ?We discussed options including living with her symptoms versus a microlumbar decompression. I feel would be appropriate at this point in time to consider discectomy failing conservative treatment. ? ?She would like to proceed with a ? ?I had an extensive discussion with the patient concerning the pathology relevant anatomy and treatment  options. At this point exhausting conservative treatment and in the presence of a neurologic deficit we discussed microlumbar decompression. I discussed the risks and benefits including bleeding, infection, DVT, PE, anesthetic complications, worsening in their symptoms, improvement in their symptoms, C SF leakage, epidural fibrosis, need for future surgeries such as revision discectomy and lumbar fusion. I also indicated that this is an operation to basically decompress the nerve roots to allow recovery as opposed to fixing a herniated disc if it is encountered and that the incidence of recurrent chest disc herniation can approach 15%. Also that nerve  root recovery is variable and may not recover completely. Any ligament or bone that is contributing to compressing the nerves will be removed as well. ? ?I discussed the operative course including overnight

## 2022-04-08 NOTE — H&P (Signed)
Paula Meyer is an 71 y.o. female.   ?Chief Complaint: back and leg pain ?HPI: Quality: aching; pinching ?Severity: pain level 4/10 ?Duration: 3 months ?Alleviating Factors: chiropractic care; Gabapentin 300 2 tid and Hydrocodone up to tid ?Aggravating Factors: sitting ?Associated Symptoms: radiation down leg (right buttock and thigh) ?Previous Surgery: none ?Prior Imaging: x ray; MRI ?Previous Injections: helped a little ?Previous PT: helped a little ?Notes: ?Evaluation and treatment by Dr. Nelva Bush. Patient was prescribed Tramadol, Gabapentin, and Hydrocodone. Injections include right ischial tuberosity, R L5-S1 ESI, and R S1 SNRB. ?Referred by Dr. Nelva Bush ? ?Patient has had 2 MRIs. The most recent 1 demonstrated a paracentral disc herniation L5-S1 to the right. She is undergone physical therapy. Activity modifications. Chiropractic decompression. And is requiring analgesics that include gabapentin 3 times a day as well as hydrocodone. No fevers or chills change in bowel or bladder function. ? ?Past Medical History:  ?Diagnosis Date  ? Allergy   ? Anemia   ? Anxiety   ? Aortic atherosclerosis ([place]) 2022  ? thoracic  ? Arthritis   ? Asthma   ? CAD (coronary artery disease)   ? Colitis   ? Coronary artery calcification seen on CT scan 07/2021  ? Depression   ? Heart murmur   ? Hx of colonic polyps   ? Hyperlipidemia   ? Hypertension   ? Osteoarthritis   ? Psoriatic arthritis ([place])   ? ? ?Past Surgical History:  ?Procedure Laterality Date  ? COLONOSCOPY  2018  ? TUBAL LIGATION    ? WISDOM TOOTH EXTRACTION    ? ? ?Family History  ?Problem Relation Age of Onset  ? Heart attack Brother   ? Hyperlipidemia Brother   ? Hypertension Brother   ? Heart disease Brother   ? Arthritis Other   ? Depression Other   ? Colon cancer Neg Hx   ? Stomach cancer Neg Hx   ? ?Social History:  reports that she quit smoking about 34 years ago. Her smoking use included cigarettes. She has never used smokeless tobacco. She reports that she does  not currently use alcohol after a past usage of about 3.0 standard drinks per week. She reports that she does not use drugs. ? ?Allergies:  ?Allergies  ?Allergen Reactions  ? Paxil [Paroxetine Hcl]   ?  Makes her grind her teeth  ? Bupropion Hcl Other (See Comments)  ?  palpitations  ? Contrast Media [Iodinated Contrast Media] Hives and Itching  ? Lisinopril   ?  cough  ? Tramadol   ?  Dependency, avoid narcotics  ? ?Current meds: ?albuterol sulfate HFA 90 mcg/actuation aerosol inhaler ?aspirin 81 mg tablet,delayed release ?atorvastatin 20 mg tablet ?azelastine 205.5 mcg (0.15 %) nasal spray ?azelastine-fluticasone 137 mcg-50 mcg/spray nasal spray ?diclofenac 1 % topical gel ?Flovent HFA 110 mcg/actuation aerosol inhaler ?gabapentin 300 mg capsule ?hydroCHLOROthiazide 25 mg tablet ?HYDROcodone 5 mg-acetaminophen 325 mg tablet ?Praluent Pen 75 mg/mL subcutaneous pen injector ? ? ?Review of Systems  ?Constitutional: Negative.   ?HENT: Negative.    ?Eyes: Negative.   ?Respiratory: Negative.    ?Cardiovascular: Negative.   ?Gastrointestinal: Negative.   ?Endocrine: Negative.   ?Genitourinary: Negative.   ?Musculoskeletal:  Positive for back pain and gait problem.  ?Skin: Negative.   ?Allergic/Immunologic: Negative.   ?Neurological:  Positive for weakness and numbness.  ?Psychiatric/Behavioral: Negative.    ? ?Last menstrual period 04/02/2011. ?Physical Exam ?Constitutional:   ?   Appearance: Normal appearance.  ?HENT:  ?  Head: Normocephalic and atraumatic.  ?   Right Ear: External ear normal.  ?   Left Ear: External ear normal.  ?   Nose: Nose normal.  ?   Mouth/Throat:  ?   Pharynx: Oropharynx is clear.  ?Eyes:  ?   Conjunctiva/sclera: Conjunctivae normal.  ?Cardiovascular:  ?   Rate and Rhythm: Normal rate and regular rhythm.  ?   Pulses: Normal pulses.  ?Pulmonary:  ?   Effort: Pulmonary effort is normal.  ?Abdominal:  ?   General: Bowel sounds are normal.  ?Musculoskeletal:  ?   Cervical back: Normal range of  motion.  ?   Comments: Gait and Station: Appearance: ambulating with no assistive devices and antalgic gait. ? ?Constitutional: General Appearance: healthy-appearing and distress (mild). ? ?Psychiatric: Mood and Affect: active and alert. ? ?Cardiovascular System: Edema Right: none; Dorsalis and posterior tibial pulses 2+. Edema Left: none. ? ?Abdomen: Inspection and Palpation: non-distended and no tenderness. ? ?Skin: Inspection and palpation: no rash. ? ?Lumbar Spine: Inspection: normal alignment. Bony Palpation of the Lumbar Spine: tender at lumbosacral junction.. Bony Palpation of the Right Hip: no tenderness of the greater trochanter and tenderness of the SI joint; Pelvis stable. Bony Palpation of the Left Hip: no tenderness of the greater trochanter and tenderness of the SI joint. Soft Tissue Palpation on the Right: No flank pain with percussion. Active Range of Motion: limited flexion and extention. ? ?Motor Strength: L1 Motor Strength on the Right: hip flexion iliopsoas 5/5. L1 Motor Strength on the Left: hip flexion iliopsoas 5/5. L2-L4 Motor Strength on the Right: knee extension quadriceps 5/5. L2-L4 Motor Strength on the Left: knee extension quadriceps 5/5. L5 Motor Strength on the Right: ankle dorsiflexion tibialis anterior 5/5 and great toe extension extensor hallucis longus 5/5. L5 Motor Strength on the Left: ankle dorsiflexion tibialis anterior 5/5 and great toe extension extensor hallucis longus 5/5. S1 Motor Strength on the Right: plantar flexion gastrocnemius 5/5. S1 Motor Strength on the Left: plantar flexion gastrocnemius 5/5. ? ?Neurological System: Knee Reflex Right: normal (2). Knee Reflex Left: normal (2). Ankle Reflex Right: normal (2). Ankle Reflex Left: normal (2). Babinski Reflex Right: plantar reflex absent. Babinski Reflex Left: plantar reflex absent. Sensation on the Right: normal distal extremities. Sensation on the Left: normal distal extremities. Special Tests on the Right: no clonus  of the ankle/knee and seated straight leg raising test positive. Special Tests on the Left: no clonus of the ankle/knee. ? ?Patient has diminished repetitive plantarflexion on the right. Slightly decreased Achilles reflex  ?Skin: ?   General: Skin is warm and dry.  ?Neurological:  ?   Mental Status: She is alert.  ?  ?.Three-view x-rays lumbar spine demonstrate disc base narrowing L5-S1. No instability in flexion extension. ? ?Most recent MRI demonstrates a paracentral disc herniation L5-S1 to the right displacing the S1 nerve root. There are mild degenerative changes at L4-5. ? ?2 view flexion-extension x-rays today demonstrate no evidence of instability ? ?Assessment/Plan ?Impression: ? ?1. Refractory S1 radiculopathy due to disc herniation progressive at L5-S1. No help from selective nerve root blocks physical therapy analgesics and activity modification with neurotension signs and diminished repetitive plantarflexion ? ?Plan: ? ?We discussed options including living with her symptoms versus a microlumbar decompression. I feel would be appropriate at this point in time to consider discectomy failing conservative treatment. ? ?She would like to proceed with a ? ?I had an extensive discussion with the patient concerning the pathology relevant anatomy and treatment  options. At this point exhausting conservative treatment and in the presence of a neurologic deficit we discussed microlumbar decompression. I discussed the risks and benefits including bleeding, infection, DVT, PE, anesthetic complications, worsening in their symptoms, improvement in their symptoms, C SF leakage, epidural fibrosis, need for future surgeries such as revision discectomy and lumbar fusion. I also indicated that this is an operation to basically decompress the nerve roots to allow recovery as opposed to fixing a herniated disc if it is encountered and that the incidence of recurrent chest disc herniation can approach 15%. Also that nerve  root recovery is variable and may not recover completely. Any ligament or bone that is contributing to compressing the nerves will be removed as well. ? ?I discussed the operative course including overnight

## 2022-04-09 ENCOUNTER — Ambulatory Visit (HOSPITAL_COMMUNITY): Payer: Medicare Other | Admitting: Physician Assistant

## 2022-04-09 ENCOUNTER — Ambulatory Visit (HOSPITAL_COMMUNITY): Payer: Medicare Other

## 2022-04-09 ENCOUNTER — Ambulatory Visit (HOSPITAL_COMMUNITY)
Admission: RE | Admit: 2022-04-09 | Discharge: 2022-04-09 | Disposition: A | Discharge status: Home / Self Care | Payer: Medicare Other | Source: Ambulatory Visit | Attending: Specialist | Admitting: Specialist

## 2022-04-09 ENCOUNTER — Ambulatory Visit (HOSPITAL_BASED_OUTPATIENT_CLINIC_OR_DEPARTMENT_OTHER): Payer: Medicare Other | Admitting: Certified Registered"

## 2022-04-09 ENCOUNTER — Encounter (HOSPITAL_COMMUNITY): Payer: Self-pay | Admitting: Specialist

## 2022-04-09 ENCOUNTER — Other Ambulatory Visit: Payer: Self-pay

## 2022-04-09 ENCOUNTER — Encounter (HOSPITAL_COMMUNITY)
Admission: RE | Disposition: A | Discharge status: Home / Self Care | Payer: Self-pay | Source: Ambulatory Visit | Attending: Specialist

## 2022-04-09 DIAGNOSIS — J45909 Unspecified asthma, uncomplicated: Secondary | ICD-10-CM | POA: Insufficient documentation

## 2022-04-09 DIAGNOSIS — M5127 Other intervertebral disc displacement, lumbosacral region: Secondary | ICD-10-CM | POA: Diagnosis not present

## 2022-04-09 DIAGNOSIS — I10 Essential (primary) hypertension: Secondary | ICD-10-CM | POA: Insufficient documentation

## 2022-04-09 DIAGNOSIS — Z87891 Personal history of nicotine dependence: Secondary | ICD-10-CM | POA: Diagnosis not present

## 2022-04-09 DIAGNOSIS — G9519 Other vascular myelopathies: Secondary | ICD-10-CM | POA: Diagnosis not present

## 2022-04-09 DIAGNOSIS — I2510 Atherosclerotic heart disease of native coronary artery without angina pectoris: Secondary | ICD-10-CM | POA: Insufficient documentation

## 2022-04-09 DIAGNOSIS — E669 Obesity, unspecified: Secondary | ICD-10-CM | POA: Diagnosis not present

## 2022-04-09 DIAGNOSIS — M48061 Spinal stenosis, lumbar region without neurogenic claudication: Secondary | ICD-10-CM | POA: Diagnosis present

## 2022-04-09 DIAGNOSIS — M4807 Spinal stenosis, lumbosacral region: Secondary | ICD-10-CM

## 2022-04-09 DIAGNOSIS — Z9889 Other specified postprocedural states: Secondary | ICD-10-CM | POA: Diagnosis not present

## 2022-04-09 DIAGNOSIS — Z01818 Encounter for other preprocedural examination: Secondary | ICD-10-CM

## 2022-04-09 HISTORY — PX: LUMBAR LAMINECTOMY/DECOMPRESSION MICRODISCECTOMY: SHX5026

## 2022-04-09 LAB — SURGICAL PCR SCREEN
MRSA, PCR: NEGATIVE
Staphylococcus aureus: NEGATIVE

## 2022-04-09 SURGERY — LUMBAR LAMINECTOMY/DECOMPRESSION MICRODISCECTOMY 1 LEVEL
Anesthesia: General

## 2022-04-09 MED ORDER — LORATADINE 10 MG PO TABS
10.0000 mg | ORAL_TABLET | Freq: Every day | ORAL | Status: DC
Start: 2022-04-10 — End: 2022-04-10

## 2022-04-09 MED ORDER — HYDROMORPHONE HCL 1 MG/ML IJ SOLN
INTRAMUSCULAR | Status: AC
  Filled 2022-04-09: qty 1

## 2022-04-09 MED ORDER — ORAL CARE MOUTH RINSE: 15.0000 mL | Freq: Once | OROMUCOSAL | Status: AC

## 2022-04-09 MED ORDER — ALBUTEROL SULFATE (2.5 MG/3ML) 0.083% IN NEBU: 2.5000 mg | INHALATION_SOLUTION | Freq: Four times a day (QID) | RESPIRATORY_TRACT | Status: DC | PRN

## 2022-04-09 MED ORDER — PROMETHAZINE HCL 25 MG/ML IJ SOLN: 6.2500 mg | INTRAMUSCULAR | Status: DC | PRN

## 2022-04-09 MED ORDER — CHLORHEXIDINE GLUCONATE 0.12 % MT SOLN: 15.0000 mL | Freq: Once | OROMUCOSAL | Status: AC

## 2022-04-09 MED ORDER — VITAMIN D 25 MCG (1000 UNIT) PO TABS
4000.0000 [IU] | ORAL_TABLET | Freq: Every day | ORAL | Status: DC
  Administered 2022-04-09: 4000 [IU] via ORAL
  Filled 2022-04-09: qty 4

## 2022-04-09 MED ORDER — ALIROCUMAB 75 MG/ML ~~LOC~~ SOAJ: 75.0000 mg | SUBCUTANEOUS | Status: DC

## 2022-04-09 MED ORDER — ALUM & MAG HYDROXIDE-SIMETH 200-200-20 MG/5ML PO SUSP: 30.0000 mL | Freq: Four times a day (QID) | ORAL | Status: DC | PRN

## 2022-04-09 MED ORDER — ONDANSETRON HCL 4 MG/2ML IJ SOLN: 4.0000 mg | Freq: Four times a day (QID) | INTRAMUSCULAR | Status: DC | PRN

## 2022-04-09 MED ORDER — PROPOFOL 10 MG/ML IV BOLUS
INTRAVENOUS | Status: DC | PRN
  Administered 2022-04-09: 150 mg via INTRAVENOUS
  Administered 2022-04-09: 30 mg via INTRAVENOUS

## 2022-04-09 MED ORDER — DOCUSATE SODIUM 100 MG PO CAPS
100.0000 mg | ORAL_CAPSULE | Freq: Two times a day (BID) | ORAL | Status: DC
  Administered 2022-04-09: 100 mg via ORAL
  Filled 2022-04-09: qty 1

## 2022-04-09 MED ORDER — DEXMEDETOMIDINE (PRECEDEX) IN NS 20 MCG/5ML (4 MCG/ML) IV SYRINGE
PREFILLED_SYRINGE | INTRAVENOUS | Status: AC
  Filled 2022-04-09: qty 10

## 2022-04-09 MED ORDER — OXYCODONE HCL 5 MG/5ML PO SOLN: 5.0000 mg | Freq: Once | ORAL | Status: DC | PRN

## 2022-04-09 MED ORDER — LIDOCAINE 2% (20 MG/ML) 5 ML SYRINGE
INTRAMUSCULAR | Status: DC | PRN
  Administered 2022-04-09: 80 mg via INTRAVENOUS

## 2022-04-09 MED ORDER — LACTATED RINGERS IV SOLN: INTRAVENOUS | Status: DC

## 2022-04-09 MED ORDER — ALBUTEROL SULFATE HFA 108 (90 BASE) MCG/ACT IN AERS: 1.0000 | INHALATION_SPRAY | Freq: Four times a day (QID) | RESPIRATORY_TRACT | Status: DC | PRN

## 2022-04-09 MED ORDER — MIDAZOLAM HCL 2 MG/2ML IJ SOLN
INTRAMUSCULAR | Status: AC
  Filled 2022-04-09: qty 2

## 2022-04-09 MED ORDER — MEPERIDINE HCL 25 MG/ML IJ SOLN: 6.2500 mg | INTRAMUSCULAR | Status: DC | PRN

## 2022-04-09 MED ORDER — THROMBIN 20000 UNITS EX SOLR
CUTANEOUS | Status: DC | PRN
  Administered 2022-04-09: 20 mL via TOPICAL

## 2022-04-09 MED ORDER — ALPRAZOLAM 0.5 MG PO TABS: 0.5000 mg | ORAL_TABLET | Freq: Two times a day (BID) | ORAL | Status: DC | PRN

## 2022-04-09 MED ORDER — PHENYLEPHRINE HCL-NACL 20-0.9 MG/250ML-% IV SOLN
INTRAVENOUS | Status: DC | PRN
  Administered 2022-04-09: 20 ug/min via INTRAVENOUS

## 2022-04-09 MED ORDER — ACETAMINOPHEN 500 MG PO TABS: 500.0000 mg | ORAL_TABLET | Freq: Three times a day (TID) | ORAL | Status: DC | PRN

## 2022-04-09 MED ORDER — PROPOFOL 10 MG/ML IV BOLUS
INTRAVENOUS | Status: AC
  Filled 2022-04-09: qty 20

## 2022-04-09 MED ORDER — EPHEDRINE 5 MG/ML INJ
INTRAVENOUS | Status: AC
  Filled 2022-04-09: qty 5

## 2022-04-09 MED ORDER — NAPHAZOLINE-PHENIRAMINE 0.025-0.3 % OP SOLN: 1.0000 [drp] | Freq: Four times a day (QID) | OPHTHALMIC | Status: DC | PRN

## 2022-04-09 MED ORDER — ACETAMINOPHEN 650 MG RE SUPP: 650.0000 mg | RECTAL | Status: DC | PRN

## 2022-04-09 MED ORDER — HYDROCHLOROTHIAZIDE 12.5 MG PO TABS
12.5000 mg | ORAL_TABLET | Freq: Every day | ORAL | Status: DC
  Administered 2022-04-09: 12.5 mg via ORAL
  Filled 2022-04-09: qty 1

## 2022-04-09 MED ORDER — ONDANSETRON HCL 4 MG PO TABS: 4.0000 mg | ORAL_TABLET | Freq: Four times a day (QID) | ORAL | Status: DC | PRN

## 2022-04-09 MED ORDER — HYDROMORPHONE HCL 1 MG/ML IJ SOLN
0.2500 mg | INTRAMUSCULAR | Status: DC | PRN
  Administered 2022-04-09 (×2): 0.5 mg via INTRAVENOUS

## 2022-04-09 MED ORDER — BUPIVACAINE-EPINEPHRINE 0.5% -1:200000 IJ SOLN
INTRAMUSCULAR | Status: AC
  Filled 2022-04-09: qty 1

## 2022-04-09 MED ORDER — AZELASTINE-FLUTICASONE 137-50 MCG/ACT NA SUSP: 1.0000 | Freq: Two times a day (BID) | NASAL | Status: DC | PRN

## 2022-04-09 MED ORDER — MENTHOL 3 MG MT LOZG: 1.0000 | LOZENGE | OROMUCOSAL | Status: DC | PRN

## 2022-04-09 MED ORDER — DOCUSATE SODIUM 100 MG PO CAPS: 100.0000 mg | ORAL_CAPSULE | Freq: Two times a day (BID) | ORAL | 1 refills | Status: DC | PRN

## 2022-04-09 MED ORDER — FENTANYL CITRATE (PF) 250 MCG/5ML IJ SOLN
INTRAMUSCULAR | Status: DC | PRN
  Administered 2022-04-09: 100 ug via INTRAVENOUS
  Administered 2022-04-09 (×3): 50 ug via INTRAVENOUS

## 2022-04-09 MED ORDER — RISAQUAD PO CAPS
1.0000 | ORAL_CAPSULE | Freq: Every day | ORAL | Status: DC
Start: 2022-04-09 — End: 2022-04-10
  Administered 2022-04-09: 1 via ORAL
  Filled 2022-04-09: qty 1

## 2022-04-09 MED ORDER — POTASSIUM 99 MG PO TABS
2.0000 | ORAL_TABLET | Freq: Every day | ORAL | Status: DC
Start: 2022-04-09 — End: 2022-04-09

## 2022-04-09 MED ORDER — B COMPLEX VITAMINS PO CAPS: 1.0000 | ORAL_CAPSULE | ORAL | Status: DC

## 2022-04-09 MED ORDER — HYDROXYZINE HCL 50 MG/ML IM SOLN
50.0000 mg | Freq: Four times a day (QID) | INTRAMUSCULAR | Status: DC | PRN
Start: 2022-04-09 — End: 2022-04-10
  Administered 2022-04-09: 50 mg via INTRAMUSCULAR
  Filled 2022-04-09: qty 1

## 2022-04-09 MED ORDER — BISACODYL 5 MG PO TBEC: 5.0000 mg | DELAYED_RELEASE_TABLET | Freq: Every day | ORAL | Status: DC | PRN

## 2022-04-09 MED ORDER — KCL IN DEXTROSE-NACL 20-5-0.45 MEQ/L-%-% IV SOLN: INTRAVENOUS | Status: DC

## 2022-04-09 MED ORDER — AMISULPRIDE (ANTIEMETIC) 5 MG/2ML IV SOLN: 10.0000 mg | Freq: Once | INTRAVENOUS | Status: DC | PRN

## 2022-04-09 MED ORDER — CEFAZOLIN SODIUM-DEXTROSE 2-4 GM/100ML-% IV SOLN
2.0000 g | INTRAVENOUS | Status: AC
  Administered 2022-04-09: 2 g via INTRAVENOUS

## 2022-04-09 MED ORDER — COQ10 100 MG PO CAPS: 100.0000 mg | ORAL_CAPSULE | Freq: Every day | ORAL | Status: DC

## 2022-04-09 MED ORDER — ROCURONIUM BROMIDE 10 MG/ML (PF) SYRINGE
PREFILLED_SYRINGE | INTRAVENOUS | Status: AC
  Filled 2022-04-09: qty 30

## 2022-04-09 MED ORDER — OXYCODONE HCL 5 MG PO TABS: 5.0000 mg | ORAL_TABLET | Freq: Once | ORAL | Status: DC | PRN

## 2022-04-09 MED ORDER — ONDANSETRON HCL 4 MG/2ML IJ SOLN
INTRAMUSCULAR | Status: AC
  Filled 2022-04-09: qty 4

## 2022-04-09 MED ORDER — PHENOL 1.4 % MT LIQD: 1.0000 | OROMUCOSAL | Status: DC | PRN

## 2022-04-09 MED ORDER — METHOCARBAMOL 1000 MG/10ML IJ SOLN
500.0000 mg | Freq: Four times a day (QID) | INTRAVENOUS | Status: DC | PRN
  Filled 2022-04-09: qty 5

## 2022-04-09 MED ORDER — CEFAZOLIN SODIUM-DEXTROSE 2-4 GM/100ML-% IV SOLN
INTRAVENOUS | Status: AC
  Filled 2022-04-09: qty 100

## 2022-04-09 MED ORDER — SUGAMMADEX SODIUM 200 MG/2ML IV SOLN
INTRAVENOUS | Status: DC | PRN
  Administered 2022-04-09: 200 mg via INTRAVENOUS

## 2022-04-09 MED ORDER — 0.9 % SODIUM CHLORIDE (POUR BTL) OPTIME
TOPICAL | Status: DC | PRN
  Administered 2022-04-09: 1000 mL

## 2022-04-09 MED ORDER — MIDAZOLAM HCL 2 MG/2ML IJ SOLN
INTRAMUSCULAR | Status: DC | PRN
  Administered 2022-04-09: 2 mg via INTRAVENOUS

## 2022-04-09 MED ORDER — DEXAMETHASONE SODIUM PHOSPHATE 10 MG/ML IJ SOLN
INTRAMUSCULAR | Status: DC | PRN
  Administered 2022-04-09: 10 mg via INTRAVENOUS

## 2022-04-09 MED ORDER — LIDOCAINE 2% (20 MG/ML) 5 ML SYRINGE
INTRAMUSCULAR | Status: AC
  Filled 2022-04-09: qty 20

## 2022-04-09 MED ORDER — CHLORHEXIDINE GLUCONATE 0.12 % MT SOLN
OROMUCOSAL | Status: AC
  Administered 2022-04-09: 15 mL via OROMUCOSAL
  Filled 2022-04-09: qty 15

## 2022-04-09 MED ORDER — OXYCODONE HCL 5 MG PO TABS: 10.0000 mg | ORAL_TABLET | ORAL | Status: DC | PRN

## 2022-04-09 MED ORDER — HYDROMORPHONE HCL 1 MG/ML IJ SOLN: 0.5000 mg | INTRAMUSCULAR | Status: DC | PRN

## 2022-04-09 MED ORDER — THROMBIN 20000 UNITS EX SOLR
CUTANEOUS | Status: AC
  Filled 2022-04-09: qty 20000

## 2022-04-09 MED ORDER — CEFAZOLIN SODIUM-DEXTROSE 2-4 GM/100ML-% IV SOLN
2.0000 g | Freq: Three times a day (TID) | INTRAVENOUS | Status: DC
  Administered 2022-04-09: 2 g via INTRAVENOUS
  Filled 2022-04-09: qty 100

## 2022-04-09 MED ORDER — ACETAMINOPHEN 325 MG PO TABS: 650.0000 mg | ORAL_TABLET | ORAL | Status: DC | PRN

## 2022-04-09 MED ORDER — B COMPLEX-C PO TABS
1.0000 | ORAL_TABLET | ORAL | Status: DC
  Filled 2022-04-09: qty 1

## 2022-04-09 MED ORDER — OXYCODONE HCL 5 MG PO TABS: 5.0000 mg | ORAL_TABLET | ORAL | 0 refills | Status: DC | PRN

## 2022-04-09 MED ORDER — METHOCARBAMOL 500 MG PO TABS
500.0000 mg | ORAL_TABLET | Freq: Four times a day (QID) | ORAL | Status: DC | PRN
  Administered 2022-04-09: 500 mg via ORAL
  Filled 2022-04-09: qty 1

## 2022-04-09 MED ORDER — BUPIVACAINE-EPINEPHRINE 0.5% -1:200000 IJ SOLN
INTRAMUSCULAR | Status: DC | PRN
  Administered 2022-04-09: 7 mL

## 2022-04-09 MED ORDER — ONDANSETRON HCL 4 MG/2ML IJ SOLN
INTRAMUSCULAR | Status: DC | PRN
  Administered 2022-04-09: 4 mg via INTRAVENOUS

## 2022-04-09 MED ORDER — DEXMEDETOMIDINE (PRECEDEX) IN NS 20 MCG/5ML (4 MCG/ML) IV SYRINGE
PREFILLED_SYRINGE | INTRAVENOUS | Status: DC | PRN
  Administered 2022-04-09: 8 ug via INTRAVENOUS
  Administered 2022-04-09: 4 ug via INTRAVENOUS
  Administered 2022-04-09: 8 ug via INTRAVENOUS

## 2022-04-09 MED ORDER — OXYCODONE HCL 5 MG PO TABS
5.0000 mg | ORAL_TABLET | ORAL | Status: DC | PRN
  Administered 2022-04-09: 5 mg via ORAL
  Filled 2022-04-09: qty 1

## 2022-04-09 MED ORDER — DEXAMETHASONE SODIUM PHOSPHATE 10 MG/ML IJ SOLN
INTRAMUSCULAR | Status: AC
  Filled 2022-04-09: qty 2

## 2022-04-09 MED ORDER — PHENYLEPHRINE 80 MCG/ML (10ML) SYRINGE FOR IV PUSH (FOR BLOOD PRESSURE SUPPORT)
PREFILLED_SYRINGE | INTRAVENOUS | Status: AC
  Filled 2022-04-09: qty 10

## 2022-04-09 MED ORDER — FENTANYL CITRATE (PF) 250 MCG/5ML IJ SOLN
INTRAMUSCULAR | Status: AC
  Filled 2022-04-09: qty 5

## 2022-04-09 MED ORDER — ROCURONIUM BROMIDE 10 MG/ML (PF) SYRINGE
PREFILLED_SYRINGE | INTRAVENOUS | Status: DC | PRN
  Administered 2022-04-09: 80 mg via INTRAVENOUS

## 2022-04-09 MED ORDER — POLYETHYLENE GLYCOL 3350 17 G PO PACK: 17.0000 g | PACK | Freq: Every day | ORAL | 0 refills | Status: DC

## 2022-04-09 MED ORDER — NAPHAZOLINE-GLYCERIN 0.012-0.25 % OP SOLN
1.0000 [drp] | Freq: Four times a day (QID) | OPHTHALMIC | Status: DC | PRN
  Filled 2022-04-09: qty 15

## 2022-04-09 MED ORDER — ACETAMINOPHEN 10 MG/ML IV SOLN
1000.0000 mg | INTRAVENOUS | Status: AC
  Administered 2022-04-09: 1000 mg via INTRAVENOUS
  Filled 2022-04-09: qty 100

## 2022-04-09 MED ORDER — POLYETHYLENE GLYCOL 3350 17 G PO PACK: 17.0000 g | PACK | Freq: Every day | ORAL | Status: DC | PRN

## 2022-04-09 SURGICAL SUPPLY — 66 items
APL PRP 5X4 STRL LF DISP 70% (MISCELLANEOUS) ×1
APPLICATOR CHLORAPREP 3ML ORNG (MISCELLANEOUS) ×1 IMPLANT
BAG COUNTER SPONGE SURGICOUNT (BAG) ×3 IMPLANT
BAG DECANTER FOR FLEXI CONT (MISCELLANEOUS) IMPLANT
BAG SPNG CNTER NS LX DISP (BAG) ×1
BAND INSRT 18 STRL LF DISP RB (MISCELLANEOUS) ×2
BAND RUBBER #18 3X1/16 STRL (MISCELLANEOUS) ×6 IMPLANT
BUR EGG ELITE 5.0 (BURR) IMPLANT
BUR RND DIAMOND ELITE 4.0 (BURR) IMPLANT
BUR STRYKR EGG 5.0 (BURR) IMPLANT
CARTRIDGE OIL MAESTRO DRILL (MISCELLANEOUS) IMPLANT
CLEANER TIP ELECTROSURG 2X2 (MISCELLANEOUS) ×3 IMPLANT
CNTNR URN SCR LID CUP LEK RST (MISCELLANEOUS) ×2 IMPLANT
CONT SPEC 4OZ STRL OR WHT (MISCELLANEOUS) ×2
DIFFUSER DRILL AIR PNEUMATIC (MISCELLANEOUS) IMPLANT
DRAPE LAPAROTOMY 100X72X124 (DRAPES) ×3 IMPLANT
DRAPE MICROSCOPE LEICA (MISCELLANEOUS) ×3 IMPLANT
DRAPE SHEET LG 3/4 BI-LAMINATE (DRAPES) ×3 IMPLANT
DRAPE SURG 17X11 SM STRL (DRAPES) ×3 IMPLANT
DRAPE UTILITY XL STRL (DRAPES) ×3 IMPLANT
DRSG AQUACEL AG ADV 3.5X 4 (GAUZE/BANDAGES/DRESSINGS) ×1 IMPLANT
DRSG AQUACEL AG ADV 3.5X 6 (GAUZE/BANDAGES/DRESSINGS) IMPLANT
DRSG TELFA 3X8 NADH (GAUZE/BANDAGES/DRESSINGS) IMPLANT
DURAPREP 26ML APPLICATOR (WOUND CARE) ×2 IMPLANT
DURASEAL SPINE SEALANT 3ML (MISCELLANEOUS) IMPLANT
ELECT BLADE 4.0 EZ CLEAN MEGAD (MISCELLANEOUS)
ELECT REM PT RETURN 9FT ADLT (ELECTROSURGICAL) ×2
ELECTRODE BLDE 4.0 EZ CLN MEGD (MISCELLANEOUS) IMPLANT
ELECTRODE REM PT RTRN 9FT ADLT (ELECTROSURGICAL) ×2 IMPLANT
GLOVE BIOGEL PI IND STRL 7.0 (GLOVE) ×2 IMPLANT
GLOVE BIOGEL PI INDICATOR 7.0 (GLOVE) ×3
GLOVE SURG SS PI 6.5 STRL IVOR (GLOVE) ×2 IMPLANT
GLOVE SURG SS PI 7.5 STRL IVOR (GLOVE) ×4 IMPLANT
GLOVE SURG SS PI 8.0 STRL IVOR (GLOVE) ×6 IMPLANT
GOWN STRL REUS W/ TWL LRG LVL3 (GOWN DISPOSABLE) ×2 IMPLANT
GOWN STRL REUS W/ TWL XL LVL3 (GOWN DISPOSABLE) ×2 IMPLANT
GOWN STRL REUS W/TWL LRG LVL3 (GOWN DISPOSABLE) ×6
GOWN STRL REUS W/TWL XL LVL3 (GOWN DISPOSABLE) ×2
IV CATH 14GX2 1/4 (CATHETERS) ×3 IMPLANT
KIT BASIN OR (CUSTOM PROCEDURE TRAY) ×3 IMPLANT
NDL SPNL 18GX3.5 QUINCKE PK (NEEDLE) ×4 IMPLANT
NEEDLE 22X1 1/2 (OR ONLY) (NEEDLE) ×3 IMPLANT
NEEDLE SPNL 18GX3.5 QUINCKE PK (NEEDLE) ×4 IMPLANT
OIL CARTRIDGE MAESTRO DRILL (MISCELLANEOUS)
PACK LAMINECTOMY NEURO (CUSTOM PROCEDURE TRAY) ×3 IMPLANT
PAD DRESSING TELFA 3X8 NADH (GAUZE/BANDAGES/DRESSINGS) IMPLANT
PATTIES SURGICAL .75X.75 (GAUZE/BANDAGES/DRESSINGS) ×3 IMPLANT
SPONGE SURGIFOAM ABS GEL 100 (HEMOSTASIS) ×3 IMPLANT
SPONGE T-LAP 4X18 ~~LOC~~+RFID (SPONGE) IMPLANT
STAPLER VISISTAT (STAPLE) IMPLANT
STRIP CLOSURE SKIN 1/2X4 (GAUZE/BANDAGES/DRESSINGS) ×3 IMPLANT
SUT NURALON 4 0 TR CR/8 (SUTURE) IMPLANT
SUT PROLENE 3 0 PS 2 (SUTURE) ×1 IMPLANT
SUT VIC AB 1 CT1 27 (SUTURE)
SUT VIC AB 1 CT1 27XBRD ANTBC (SUTURE) IMPLANT
SUT VIC AB 1-0 CT2 27 (SUTURE) ×1 IMPLANT
SUT VIC AB 2-0 CT1 27 (SUTURE)
SUT VIC AB 2-0 CT1 TAPERPNT 27 (SUTURE) IMPLANT
SUT VIC AB 2-0 CT2 27 (SUTURE) ×1 IMPLANT
SYR 3ML LL SCALE MARK (SYRINGE) ×3 IMPLANT
TOWEL GREEN STERILE (TOWEL DISPOSABLE) ×3 IMPLANT
TOWEL GREEN STERILE FF (TOWEL DISPOSABLE) ×3 IMPLANT
TRAY FOLEY MTR SLVR 14FR STAT (SET/KITS/TRAYS/PACK) ×1 IMPLANT
TRAY FOLEY MTR SLVR 16FR STAT (SET/KITS/TRAYS/PACK) ×2 IMPLANT
WIPE CHG CHLORHEXIDINE 2% (PERSONAL CARE ITEMS) ×3 IMPLANT
YANKAUER SUCT BULB TIP NO VENT (SUCTIONS) ×3 IMPLANT

## 2022-04-09 NOTE — Transfer of Care (Signed)
Immediate Anesthesia Transfer of Care Note ? ?Patient: Paula Meyer ? ?Procedure(s) Performed: Microlumbar decompression Lumbar five -Sacral one right ? ?Patient Location: PACU ? ?Anesthesia Type:General ? ?Level of Consciousness: awake, alert  and oriented ? ?Airway & Oxygen Therapy: Patient Spontanous Breathing and Patient connected to face mask oxygen ? ?Post-op Assessment: Report given to RN and Post -op Vital signs reviewed and stable ? ?Post vital signs: Reviewed and stable ? ?Last Vitals:  ?Vitals Value Taken Time  ?BP 102/92 04/09/22 1240  ?Temp 36.5 ?C 04/09/22 1240  ?Pulse 49 04/09/22 1244  ?Resp 14 04/09/22 1244  ?SpO2 95 % 04/09/22 1244  ?Vitals shown include unvalidated device data. ? ?Last Pain:  ?Vitals:  ? 04/09/22 0858  ?TempSrc:   ?PainSc: 2   ?   ? ?Patients Stated Pain Goal: 2 (04/09/22 2081) ? ?Complications: No notable events documented. ?

## 2022-04-09 NOTE — Evaluation (Signed)
Occupational Therapy Evaluation ?Patient Details ?Name: Paula Meyer ?MRN: [id number] ?DOB: [birth year] ?Today's Date: 04/09/2022 ? ? ?History of Present Illness 71 yo F adm for scheduled  Microlubar decompression L5-S1 right.  PMH includes:Allergy     Anemia    Anxiety    Aortic atherosclerosis thoracic  Arthritis    Asthma    CAD (coronary artery disease)    Colitis    Coronary artery calcification seen on CT scan 07/2021  Depression    Heart murmur    Hx of colonic polyps    Hyperlipidemia    Hypertension    Osteoarthritis.  ? ?Clinical Impression ?  ?Patient admitted for the procedure above.  PTA she lives alone, and needed no assist for any aspect of mobility, ADL or IADL.  Patient continues to drive, and cares for her dog.  Currently she is expressing mild radiating discomfort through her right thigh, but it is not impacting her functional status.  She is at a Mod I level for ADL completion at a sit/stand level, ambulating the halls without assist, and climbed 6 stairs without incident.  She will have the needed assist at home from her daughter's, and no further OT exist in the acute setting.  Precaution sheet issued, and all questions answered.    ?   ? ?Recommendations for follow up therapy are one component of a multi-disciplinary discharge planning process, led by the attending physician.  Recommendations may be updated based on patient status, additional functional criteria and insurance authorization.  ? ?Follow Up Recommendations ? No OT follow up  ?  ?Assistance Recommended at Discharge PRN  ?Patient can return home with the following   ? ?  ?Functional Status Assessment ? Patient has had a recent decline in their functional status and demonstrates the ability to make significant improvements in function in a reasonable and predictable amount of time.  ?Equipment Recommendations ? None recommended by OT  ?  ?Recommendations for Other Services   ? ? ?  ?Precautions / Restrictions  Precautions ?Precautions: Back ?Precaution Booklet Issued: Yes (comment) ?Restrictions ?Weight Bearing Restrictions: No  ? ?  ? ?Mobility Bed Mobility ?Overal bed mobility: Modified Independent ?  ?  ?  ?  ?  ?  ?  ?  ? ?Transfers ?Overall transfer level: Modified independent ?  ?  ?  ?  ?  ?  ?  ?  ?  ?  ? ?  ?Balance Overall balance assessment: Mild deficits observed, not formally tested ?  ?  ?  ?  ?  ?  ?  ?  ?  ?  ?  ?  ?  ?  ?  ?  ?  ?  ?   ? ?ADL either performed or assessed with clinical judgement  ? ?ADL Overall ADL's : At baseline ?  ?  ?  ?  ?  ?  ?  ?  ?  ?  ?  ?  ?  ?  ?  ?  ?  ?  ?  ?General ADL Comments: initial generalized supervision with nausea and vomit.  Then patient felling better and at baseline.  ? ? ? ?Vision Baseline Vision/History: 0 No visual deficits ?Patient Visual Report: No change from baseline ?   ?   ?Perception Perception ?Perception: Not tested ?  ?Praxis Praxis ?Praxis: Not tested ?  ? ?Pertinent Vitals/Pain Pain Assessment ?Pain Assessment: Faces ?Faces Pain Scale: Hurts little more ?Pain Location: R thigh ?Pain Descriptors /  Indicators: Radiating ?Pain Intervention(s): Monitored during session  ? ? ? ?Hand Dominance Right ?  ?Extremity/Trunk Assessment Upper Extremity Assessment ?Upper Extremity Assessment: Overall WFL for tasks assessed ?  ?Lower Extremity Assessment ?Lower Extremity Assessment: Overall WFL for tasks assessed ?  ?Cervical / Trunk Assessment ?Cervical / Trunk Assessment: Back Surgery ?  ?Communication Communication ?Communication: No difficulties ?  ?Cognition Arousal/Alertness: Awake/alert ?Behavior During Therapy: [facility] for tasks assessed/performed ?Overall Cognitive Status: Within Functional Limits for tasks assessed ?  ?  ?  ?  ?  ?  ?  ?  ?  ?  ?  ?  ?  ?  ?  ?  ?  ?  ?  ?General Comments   VSS on RA ? ?  ?Exercises   ?  ?Shoulder Instructions    ? ? ?Home Living Family/patient expects to be discharged to:: Private residence ?Living Arrangements:  Alone ?Available Help at Discharge: Family;Available 24 hours/day ?Type of Home: House ?Home Access: Stairs to enter ?Entrance Stairs-Number of Steps: 1 ?  ?Home Layout: Two level ?Alternate Level Stairs-Number of Steps: 12 ?Alternate Level Stairs-Rails: Right ?Bathroom Shower/Tub: Walk-in shower ?  ?Bathroom Toilet: Standard ?Bathroom Accessibility: Yes ?How Accessible: Accessible via walker ?Home Equipment: None ?  ?  ?  ? ?  ?Prior Functioning/Environment Prior Level of Function : Independent/Modified Independent;Driving ?  ?  ?  ?  ?  ?  ?  ?  ?  ? ?  ?  ?OT Problem List: Pain ?  ?   ?OT Treatment/Interventions:    ?  ?OT Goals(Current goals can be found in the care plan section) Acute Rehab OT Goals ?Patient Stated Goal: Return home ?OT Goal Formulation: With patient ?Time For Goal Achievement: 04/10/22 ?Potential to Achieve Goals: Good  ?OT Frequency:   ?  ? ?Co-evaluation   ?  ?  ?  ?  ? ?  ?AM-PAC OT "6 Clicks" Daily Activity     ?Outcome Measure Help from another person eating meals?: None ?Help from another person taking care of personal grooming?: None ?Help from another person toileting, which includes using toliet, bedpan, or urinal?: None ?Help from another person bathing (including washing, rinsing, drying)?: None ?Help from another person to put on and taking off regular upper body clothing?: None ?Help from another person to put on and taking off regular lower body clothing?: None ?6 Click Score: 24 ?  ?End of Session Nurse Communication: Mobility status ? ?Activity Tolerance: Patient tolerated treatment well ?Patient left: in chair;with call bell/phone within reach;with family/visitor present ? ?OT Visit Diagnosis: Unsteadiness on feet (R26.81);Pain ?Pain - Right/Left: Right ?Pain - part of body: Leg  ?              ?Time: 1517-6160 ?OT Time Calculation (min): 21 min ?Charges:  OT General Charges ?$OT Visit: 1 Visit ?OT Evaluation ?$OT Eval Moderate Complexity: 1 Mod ? ?04/09/2022 ? ?RP,  OTR/L ? ?Acute Rehabilitation Services ? ?Office:  [phone] ? ? ?Kambri Dismore D Wiletta Bermingham ?04/09/2022, 5:06 PM ?

## 2022-04-09 NOTE — Anesthesia Postprocedure Evaluation (Signed)
Anesthesia Post Note ? ?Patient: DONINIQUE LWIN ? ?Procedure(s) Performed: Microlumbar decompression Lumbar five -Sacral one right ? ?  ? ?Patient location during evaluation: PACU ?Anesthesia Type: General ?Level of consciousness: awake and alert ?Pain management: pain level controlled ?Vital Signs Assessment: post-procedure vital signs reviewed and stable ?Respiratory status: spontaneous breathing, nonlabored ventilation and respiratory function stable ?Cardiovascular status: blood pressure returned to baseline and stable ?Postop Assessment: no apparent nausea or vomiting ?Anesthetic complications: no ? ? ?No notable events documented. ? ?Last Vitals:  ?Vitals:  ? 04/09/22 1401 04/09/22 1425  ?BP:  (!) 141/64  ?Pulse: (!) 44 (!) 55  ?Resp: 15 18  ?Temp: (!) 36.2 ?C   ?SpO2: 100% 97%  ?  ?Last Pain:  ?Vitals:  ? 04/09/22 1240  ?TempSrc:   ?PainSc: 0-No pain  ? ? ?  ?  ?  ?  ?  ?  ? ?Lynda Rainwater ? ? ? ? ?

## 2022-04-09 NOTE — Plan of Care (Signed)

## 2022-04-09 NOTE — Anesthesia Procedure Notes (Signed)
Procedure Name: Intubation ?Date/Time: 04/09/2022 10:53 AM ?Performed by: Griffin Dakin, CRNA ?Pre-anesthesia Checklist: Patient identified, Emergency Drugs available, Suction available and Patient being monitored ?Patient Re-evaluated:Patient Re-evaluated prior to induction ?Oxygen Delivery Method: Circle system utilized ?Preoxygenation: Pre-oxygenation with 100% oxygen ?Induction Type: IV induction ?Ventilation: Mask ventilation without difficulty ?Laryngoscope Size: Sabra Heck and 3 ?Grade View: Grade I ?Tube type: Oral ?Tube size: 7.0 mm ?Number of attempts: 1 ?Airway Equipment and Method: Stylet and Oral airway ?Placement Confirmation: ETT inserted through vocal cords under direct vision, positive ETCO2 and breath sounds checked- equal and bilateral ?Secured at: 23 cm ?Tube secured with: Tape ?Dental Injury: Teeth and Oropharynx as per pre-operative assessment  ? ? ? ? ?

## 2022-04-09 NOTE — Discharge Instructions (Signed)
Instructions  Walk As Tolerated utilizing back precautions.  No bending, twisting, or lifting.  No driving for 2 weeks.   Aquacel dressing may remain in place until follow up. May shower with aquacel dressing in place. If the dressing peels off or becomes saturated, you may remove aquacel dressing and place gauze and tape dressing which should be kept clean and dry and changed daily. Do not remove steri-strips if they are present. See Dr. Beane in office in 10 to 14 days. Begin taking aspirin 81mg per day starting 4 days after your surgery if not allergic to aspirin or on another blood thinner. Walk daily even outside. Use a cane or walker only if necessary. Avoid sitting on soft sofas 

## 2022-04-09 NOTE — Brief Op Note (Signed)
04/09/2022 ? ?10:42 AM ? ?PATIENT:  Paula Meyer  71 y.o. female ? ?PRE-OPERATIVE DIAGNOSIS:  Stenosis L5-S1 Right ? ?POST-OPERATIVE DIAGNOSIS:  * No post-op diagnosis entered * ? ?PROCEDURE:  Procedure(s): ?Microlubar decompression L5-S1 right (N/A) ? ?SURGEON:  Surgeon(s) and Role: ?   Susa Day, MD - Primary ? ?PHYSICIAN ASSISTANT:  ? ?ASSISTANTS: B[place]  ? ?ANESTHESIA:   general ? ?EBL:  min  ? ?BLOOD ADMINISTERED:none ? ?DRAINS: none  ? ?LOCAL MEDICATIONS USED:  MARCAINE    ? ?SPECIMEN:  No Specimen ? ?DISPOSITION OF SPECIMEN:  N/A ? ?COUNTS:  YES ? ?TOURNIQUET:  * No tourniquets in log * ? ?DICTATION: .Other Dictation: Dictation Number [id number] ? ?PLAN OF CARE: Admit for overnight observation ? ?PATIENT DISPOSITION:  PACU - hemodynamically stable. ?  ?Delay start of Pharmacological VTE agent (>24hrs) due to surgical blood loss or risk of bleeding: yes ? ?

## 2022-04-09 NOTE — Progress Notes (Signed)
Patient awaiting transport to her vehicle for discharge home; in no acute distress nor complaints of pain nor discomfort; moves all extremities well; incision on her back with hydrocolloid dressing and is clean, dry and intact; room was checked for all her belongings and was taken by family members; discharge instructions concerning her medications, incision care, follow up appointment and when to call the doctor as needed were all discussed with patient and family members by RN and all expressed understanding on the instructions given. ?

## 2022-04-09 NOTE — Op Note (Signed)
NAME: Paula Meyer ?MEDICAL RECORD NO: [id number] ?ACCOUNT NO: [id number] ?DATE OF BIRTH: 02/27/1951 ?FACILITY: [facility] ?LOCATION: [facility] ?PHYSICIAN: Johnn Hai, MD ? ?Operative Report  ? ?DATE OF PROCEDURE: 04/09/2022 ? ?PREOPERATIVE DIAGNOSIS:  Spinal stenosis, HNP, L5-S1, right. ? ?POSTOPERATIVE DIAGNOSES:  Spinal stenosis, HNP, L5-S1, right. ? ?PROCEDURE PERFORMED:   ?1.  Microlumbar decompression, L5-S1, right. ?2.  Foraminotomy S1, L5, right. ?3.  Microdiskectomy L5-S1, right. ?4.  Lysis of extensive epidural venous plexus. ? ?ANESTHESIA:  General. ? ?ASSISTANT:  Lacie Draft, PA. ? ?HISTORY:  This is a 71 year old with S1 radiculopathy secondary to disk herniation and spinal stenosis indicated for microlumbar decompression, L5-S1 on the right.  Failing conservative treatment.  Risks and benefits discussed including bleeding,  ?infection, damage to neurovascular structures, no change in symptoms, worsening symptoms, DVT, PE, anesthetic complications, etc. ? ?DESCRIPTION OF PROCEDURE:  With the patient in supine position.  After induction of adequate general anesthesia, 2 grams Kefzol, placed prone on the Wilson frame.  All bony prominences were well padded.  Foley to gravity.  Lumbar region was prepped and  ?draped in the usual sterile fashion.  Two 18-gauge spinal needle was utilized to localize 5-1 interspace, confirmed with x-ray.  Incision was made from the spinous process of L5-S1.  Subcutaneous tissue was dissected.  Electrocautery was utilized to  ?achieve hemostasis.  Dorsal lumbar fascia divided in line with skin incision.  Paraspinous muscle elevated from lamina 5-1.  Operating microscope was draped and brought in the surgical field.  Confirmatory radiograph obtained.  Hemilaminotomy of the  ?caudad edge of L5 was performed with 3 mm Kerrison to the point detaching the ligamentum flavum.  A micro curette utilized to detach the ligamentum flavum from the cephalad edge of S1.  Penfield 4 was then  used to protect the S1 nerve root.  I performed  ?a foraminotomy of S1.  I then placed a neuro patty beneath the ligamentum flavum to protect the S1 nerve root, removed ligamentum flavum from the interspace.  There was hypertrophic ligamentum as well as hypertrophic facet.  This was compressing the S1  ?nerve root in the lateral recess.  I then gently mobilized the S1 nerve root medially.  There was an extensive epidural venous plexus tethering the S1 nerve root.  This was cauterized sequentially and divided.  Focal HNP was noted.  I performed an  ?annulotomy and copious portion of disk material was removed from the disk space with a micropituitary, straight and upbiting further mobilizing with an Epstein preserving the endplates.  Then, catheter lavage with additional fragments were retrieved.   ?Foraminotomy L5 was performed.  Following this, there was 1 cm excursion of the S1 nerve root medially in the pedicle without tension.  The nerve root was erythematous and edematous.  Confirmatory radiograph obtained with a Penfield in the disk space.   ?No evidence of CSF leakage or active bleeding.  Irrigated the disk space again and the laminotomy defect copiously.  Following this, an inspection revealed no evidence of CSF leakage or active bleeding.  Bone wax was placed on the cancellous surfaces.  I ? removed the [facility] retractor.  Paraspinous muscles irrigated.  No active bleeding.  I then repaired the dorsal lumbar fascia with #1 Vicryl in interrupted figure-of-eight suture, subcutaneous with 2-0 and skin with Prolene.  Sterile dressing applied. ?  Placed supine on the hospital bed, extubated without difficulty and transported to the recovery room in satisfactory condition. ? ?The patient tolerated the procedure  well.  No complications. ? ?ASSISTANT:  Lacie Draft, PA. ? ?BLOOD LOSS:  25 mL ? ? ?PUS ?D: 04/09/2022 12:42:39 pm T: 04/09/2022 8:26:00 pm  ?JOB: [id number]/ [id number]  ?

## 2022-04-09 NOTE — Interval H&P Note (Signed)
History and Physical Interval Note: ? ?04/09/2022 ?10:11 AM ? ?Paula Meyer  has presented today for surgery, with the diagnosis of Stenosis L5-S1 Right.  The various methods of treatment have been discussed with the patient and family. After consideration of risks, benefits and other options for treatment, the patient has consented to  Procedure(s): ?Microlubar decompression L5-S1 right (N/A) as a surgical intervention.  The patient's history has been reviewed, patient examined, no change in status, stable for surgery.  I have reviewed the patient's chart and labs.  Questions were answered to the patient's satisfaction.   ? ? ?Johnn Hai ? ? ?

## 2022-04-10 ENCOUNTER — Encounter (HOSPITAL_COMMUNITY): Payer: Self-pay | Admitting: Specialist

## 2022-04-13 ENCOUNTER — Other Ambulatory Visit: Payer: Self-pay

## 2022-04-18 DIAGNOSIS — Z20822 Contact with and (suspected) exposure to covid-19: Secondary | ICD-10-CM | POA: Diagnosis not present

## 2022-05-05 DIAGNOSIS — M5451 Vertebrogenic low back pain: Secondary | ICD-10-CM | POA: Diagnosis not present

## 2022-05-07 ENCOUNTER — Telehealth: Payer: Self-pay

## 2022-05-07 NOTE — Telephone Encounter (Signed)
Pt called to check on her refill request from yesterday for Qsymia

## 2022-05-08 ENCOUNTER — Other Ambulatory Visit: Payer: Self-pay | Admitting: Medical

## 2022-05-08 DIAGNOSIS — M5451 Vertebrogenic low back pain: Secondary | ICD-10-CM | POA: Diagnosis not present

## 2022-05-08 MED ORDER — QSYMIA 7.5-46 MG PO CP24: 1.0000 | ORAL_CAPSULE | ORAL | 1 refills | Status: DC

## 2022-05-10 NOTE — Telephone Encounter (Signed)
done

## 2022-05-11 ENCOUNTER — Telehealth: Payer: Self-pay

## 2022-05-11 NOTE — Telephone Encounter (Signed)
P.A. QSYMIA 

## 2022-05-12 ENCOUNTER — Ambulatory Visit (INDEPENDENT_AMBULATORY_CARE_PROVIDER_SITE_OTHER): Payer: Medicare Other | Admitting: Medical

## 2022-05-12 VITALS — BP 122/72 | HR 71 | Wt 208.6 lb

## 2022-05-12 DIAGNOSIS — I2510 Atherosclerotic heart disease of native coronary artery without angina pectoris: Secondary | ICD-10-CM

## 2022-05-12 DIAGNOSIS — R6889 Other general symptoms and signs: Secondary | ICD-10-CM | POA: Diagnosis not present

## 2022-05-12 DIAGNOSIS — R5383 Other fatigue: Secondary | ICD-10-CM | POA: Diagnosis not present

## 2022-05-12 DIAGNOSIS — R635 Abnormal weight gain: Secondary | ICD-10-CM

## 2022-05-12 DIAGNOSIS — R7301 Impaired fasting glucose: Secondary | ICD-10-CM | POA: Insufficient documentation

## 2022-05-12 DIAGNOSIS — Z6834 Body mass index (BMI) 34.0-34.9, adult: Secondary | ICD-10-CM | POA: Diagnosis not present

## 2022-05-12 LAB — POCT GLYCOSYLATED HEMOGLOBIN (HGB A1C): Hemoglobin A1C: 5.6 % (ref 4.0–5.6)

## 2022-05-12 MED ORDER — BLOOD GLUCOSE TEST VI STRP: ORAL_STRIP | 1 refills | Status: AC

## 2022-05-12 MED ORDER — BLOOD GLUCOSE TEST VI STRP: ORAL_STRIP | 1 refills | Status: DC

## 2022-05-12 MED ORDER — ACCU-CHEK SOFTCLIX LANCETS MISC: 1 refills | Status: AC

## 2022-05-12 NOTE — Progress Notes (Signed)
Subjective:  Paula Meyer is a 71 y.o. female who presents for Chief Complaint  Patient presents with   very sleepy after eating    Eating some and weight gain. Gabapentin- causing weight gain.      Here for a few concerns.  Wanted to start back on Qsymia given some recent weight gain.  Has done ok on this medication prior.  This was sent to pharmacy over weekend, but she hasn't started back on this yet.    When she eats, feels like she goes into a stupor.  Feels sluggish at times after eating.  Sometimes feet are cold.     Due to some recent swelling in her legs, is back to whole tablet of Hydrochlorothiazide.  Was doing 1/2 tablet daily.  She also attributes her recent ankle swelling to stopping her Cimzia for her recent surgery.  She has been doing relatively well since her surgery.  She has not yet started back the Cimzia per rheumatology.  Lately she has had UTI in and feels like the corners of her eyes has some discharge.  She is using over-the-counter allergy drops but it does not help.  Her eyes do feel dry most the time not red or blurry vision.  No other vision concerns.  She wants to review back over some of her labs in recent months  No other aggravating or relieving factors.    No other c/o.  Past Medical History:  Diagnosis Date   Allergy    Anemia    Anxiety    Aortic atherosclerosis ([place]) 2022   thoracic   Arthritis    Asthma    CAD (coronary artery disease)    Colitis    Coronary artery calcification seen on CT scan 07/2021   Depression    Heart murmur    Hx of colonic polyps    Hyperlipidemia    Hypertension    Osteoarthritis    Psoriatic arthritis ([place])       Current Outpatient Medications on File Prior to Visit  Medication Sig Dispense Refill   albuterol (VENTOLIN HFA) 108 (90 Base) MCG/ACT inhaler INHALE 2 PUFFS INTO THE LUNGS EVERY 4 HOURS AS NEEDED FOR COUGH, WHEEZING OR SHORTNESS OF BREATH 6.7 g 0   Alirocumab (PRALUENT) 75 MG/ML SOAJ  Inject 75 mg into the skin every 14 (fourteen) days. 2 mL 1   Azelastine-Fluticasone 137-50 MCG/ACT SUSP PLACE 1 SPRAY INTO THE NOSE 2 (TWO) TIMES DAILY AS NEEDED. 23 g 1   b complex vitamins capsule Take 1 capsule by mouth every other day.     Cholecalciferol (VITAMIN D) 50 MCG (2000 UT) tablet Take 4,000 Units by mouth daily.     Coenzyme Q10 (COQ10) 100 MG CAPS Take 100 mg by mouth daily.     fexofenadine (ALLEGRA) 180 MG tablet Take 180 mg by mouth daily.     hydrochlorothiazide (HYDRODIURIL) 25 MG tablet Take 1 tablet (25 mg total) by mouth daily. (Patient taking differently: Take 12.5 mg by mouth daily.) 90 tablet 3   naphazoline-pheniramine (NAPHCON-A) 0.025-0.3 % ophthalmic solution Place 1 drop into both eyes 4 (four) times daily as needed for eye irritation or allergies.     polyethylene glycol (MIRALAX / GLYCOLAX) 17 g packet Take 17 g by mouth daily. 14 each 0   Potassium 99 MG TABS Take 2 tablets by mouth daily.     acetaminophen (TYLENOL) 500 MG tablet Take 500 mg by mouth every 8 (eight) hours as needed for  moderate pain.     certolizumab pegol (CIMZIA) 2 X 200 MG/ML PSKT Inject 400 mg into the skin every 30 (thirty) days. (Patient not taking: Reported on 05/12/2022)     Phentermine-Topiramate (QSYMIA) 7.5-46 MG CP24 Take 1 capsule by mouth every morning. (Patient not taking: Reported on 05/12/2022) 30 capsule 1   No current facility-administered medications on file prior to visit.     The following portions of the patient's history were reviewed and updated as appropriate: allergies, current medications, past family history, past medical history, past social history, past surgical history and problem list.  ROS Otherwise as in subjective above  Objective: BP 122/72   Pulse 71   Wt 208 lb 9.6 oz (94.6 kg)   LMP 04/02/2011   BMI 34.71 kg/m   Wt Readings from Last 3 Encounters:  05/12/22 208 lb 9.6 oz (94.6 kg)  04/09/22 202 lb (91.6 kg)  04/01/22 204 lb (92.5 kg)     General appearance: alert, no distress, well developed, well nourished No obvious erythema of either eyes or conjunctiva, eyes appear normal, no discharge or drainage or abnormality Oral MMM, no lesions Pulses: 2+ radial pulses, 2+ pedal pulses, normal cap refill Ext: no edema   Assessment: Encounter Diagnoses  Name Primary?   Impaired fasting blood sugar Yes   BMI 34.0-34.9,adult    Itchy eyes    Weight gain    Other fatigue      Plan: We discussed her concerns and symptoms, discussed and handout given below.  Patient Instructions  Begin using a glucometer to test her blood sugars.  Since you have been feeling tired or in a stupor after eating, want to start checking your sugars occasionally before meal and 2 hours after a meal.  Your Premeal sugar should be preferably between 70 and 100.  If your Premeal sugar is lower than 70 or much greater than 150 that would be a concern.  Also if your blood sugar 2 hours after eating is greater than 200, that is abnormal  You can check your sugar periodically otherwise.  The goal is to be between 70 and 100 fasting in the mornings before breakfast  Try to eat a protein plus a carbohydrate at each meal.  For example yogurt plus granola would be a protein and carbohydrate or apple and cheese stick or carrots and hummus.  In other words avoid eating straight sugars such as candy or soda or sweet tea.  Complex carbohydrates include things like rice, bread, pasta, quinoa oatmeal and barley.  For dry eyes, begin Thera tears over-the-counter to keep your eyes moist.   If you have red itchy eyes from allergies you can use something over-the-counter called Pataday.  Your eyes today are not red or irritated appearing.  For now I will change her Allegra to nighttime since you have been feeling more fatigued or sleepy  Regarding weight gain, go ahead and begin back on the Qsymia for now to help with your exercise healthy diet and weight loss  measures.  However going forward we might want to consider something called Wegovy or similar medicine to help with weight loss and the control blood sugars since your labs have been in the prediabetic range   Paula Meyer was seen today for very sleepy after eating.  Diagnoses and all orders for this visit:  Impaired fasting blood sugar -     HgB A1c  BMI 34.0-34.9,adult  Itchy eyes  Weight gain -     HgB A1c  Other fatigue -     HgB A1c  Other orders -     Glucose Blood (BLOOD GLUCOSE TEST STRIPS) STRP; Test 1-2 times daily. Pt uses accu-chek meter -     Accu-Chek Softclix Lancets lancets; Test 1-2 times daily    Follow up: 6 weeks

## 2022-05-12 NOTE — Telephone Encounter (Signed)
P.A. approved til 08/11/22, pt informed, faxed pharmacy

## 2022-05-12 NOTE — Addendum Note (Signed)
Addended by: Minette Headland A on: 05/12/2022 12:21 PM   Modules accepted: Orders

## 2022-05-12 NOTE — Patient Instructions (Signed)
Begin using a glucometer to test her blood sugars.  Since you have been feeling tired or in a stupor after eating, want to start checking your sugars occasionally before meal and 2 hours after a meal.  Your Premeal sugar should be preferably between 70 and 100.  If your Premeal sugar is lower than 70 or much greater than 150 that would be a concern.  Also if your blood sugar 2 hours after eating is greater than 200, that is abnormal  You can check your sugar periodically otherwise.  The goal is to be between 70 and 100 fasting in the mornings before breakfast  Try to eat a protein plus a carbohydrate at each meal.  For example yogurt plus granola would be a protein and carbohydrate or apple and cheese stick or carrots and hummus.  In other words avoid eating straight sugars such as candy or soda or sweet tea.  Complex carbohydrates include things like rice, bread, pasta, quinoa oatmeal and barley.  For dry eyes, begin Thera tears over-the-counter to keep your eyes moist.   If you have red itchy eyes from allergies you can use something over-the-counter called Pataday.  Your eyes today are not red or irritated appearing.  For now I will change her Allegra to nighttime since you have been feeling more fatigued or sleepy  Regarding weight gain, go ahead and begin back on the Qsymia for now to help with your exercise healthy diet and weight loss measures.  However going forward we might want to consider something called Wegovy or similar medicine to help with weight loss and the control blood sugars since your labs have been in the prediabetic range

## 2022-05-13 DIAGNOSIS — M549 Dorsalgia, unspecified: Secondary | ICD-10-CM | POA: Diagnosis not present

## 2022-05-13 DIAGNOSIS — M79643 Pain in unspecified hand: Secondary | ICD-10-CM | POA: Diagnosis not present

## 2022-05-13 DIAGNOSIS — R768 Other specified abnormal immunological findings in serum: Secondary | ICD-10-CM | POA: Diagnosis not present

## 2022-05-13 DIAGNOSIS — L4050 Arthropathic psoriasis, unspecified: Secondary | ICD-10-CM | POA: Diagnosis not present

## 2022-05-13 DIAGNOSIS — M1990 Unspecified osteoarthritis, unspecified site: Secondary | ICD-10-CM | POA: Diagnosis not present

## 2022-05-13 DIAGNOSIS — L409 Psoriasis, unspecified: Secondary | ICD-10-CM | POA: Diagnosis not present

## 2022-05-13 DIAGNOSIS — Z79899 Other long term (current) drug therapy: Secondary | ICD-10-CM | POA: Diagnosis not present

## 2022-05-14 DIAGNOSIS — M5451 Vertebrogenic low back pain: Secondary | ICD-10-CM | POA: Diagnosis not present

## 2022-05-18 DIAGNOSIS — M5451 Vertebrogenic low back pain: Secondary | ICD-10-CM | POA: Diagnosis not present

## 2022-05-21 DIAGNOSIS — M5451 Vertebrogenic low back pain: Secondary | ICD-10-CM | POA: Diagnosis not present

## 2022-05-25 DIAGNOSIS — L4050 Arthropathic psoriasis, unspecified: Secondary | ICD-10-CM | POA: Diagnosis not present

## 2022-05-28 DIAGNOSIS — M5451 Vertebrogenic low back pain: Secondary | ICD-10-CM | POA: Diagnosis not present

## 2022-06-02 DIAGNOSIS — M5451 Vertebrogenic low back pain: Secondary | ICD-10-CM | POA: Diagnosis not present

## 2022-06-04 DIAGNOSIS — M5451 Vertebrogenic low back pain: Secondary | ICD-10-CM | POA: Diagnosis not present

## 2022-06-09 DIAGNOSIS — M5451 Vertebrogenic low back pain: Secondary | ICD-10-CM | POA: Diagnosis not present

## 2022-06-10 ENCOUNTER — Ambulatory Visit (INDEPENDENT_AMBULATORY_CARE_PROVIDER_SITE_OTHER): Payer: Medicare Other | Admitting: Medical

## 2022-06-10 VITALS — BP 110/70 | HR 64 | Wt 210.4 lb

## 2022-06-10 DIAGNOSIS — F4321 Adjustment disorder with depressed mood: Secondary | ICD-10-CM

## 2022-06-10 DIAGNOSIS — I2510 Atherosclerotic heart disease of native coronary artery without angina pectoris: Secondary | ICD-10-CM | POA: Diagnosis not present

## 2022-06-10 DIAGNOSIS — R4589 Other symptoms and signs involving emotional state: Secondary | ICD-10-CM | POA: Diagnosis not present

## 2022-06-10 DIAGNOSIS — R41840 Attention and concentration deficit: Secondary | ICD-10-CM

## 2022-06-10 MED ORDER — DULOXETINE HCL 60 MG PO CPEP: 60.0000 mg | ORAL_CAPSULE | Freq: Every day | ORAL | 1 refills | Status: DC

## 2022-06-10 NOTE — Addendum Note (Signed)
Addended by: Minette Headland A on: 06/10/2022 02:51 PM   Modules accepted: Orders

## 2022-06-10 NOTE — Progress Notes (Signed)
Subjective: Chief Complaint  Patient presents with   antidepressant medication    On cymbalta '40mg'$  and started feeling better since taking . PH-9 abnormal   Here for concerns for depressed mood.  She sent me a MyChart message a few weeks ago about her mood.  We had talked last year at some point about using Cymbalta for both chronic pain and mood but she never started the medication.  She had previously been prescribed duloxetine in the past and was on it for about 6 months.  She has been on other medications in the past including Prozac Paxil and bupropion.  She did really well on Prozac in the past but at the time the psychiatrist wanted to switch her to a different medication.  She notes for the past year her mood has been down.  Her mother died last year, her mother died this 2022-04-05.  She never fully feels like she coped with those losses.    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  She actually recently withdrew from a class given the changes in mood and lack of focus.  She is retired but going back to school to get a PhD.  She had started back on Qsymia to help with weight loss but quickly stopped this recently when she was not feeling herself.  No HI/SI.  She denies any history of bipolar disorder, psychotic episode or prior hospitalization for mood.  She denies delusions or hallucinations.  No other aggravating or relieving factors. No other complaint.  Past Medical History:  Diagnosis Date   Allergy    Anemia    Anxiety    Aortic atherosclerosis ([place]) 2022   thoracic   Arthritis    Asthma    CAD (coronary artery disease)    Colitis    Coronary artery calcification seen on CT scan 07/2021   Depression    Heart murmur    Hx of colonic polyps    Hyperlipidemia    Hypertension    Osteoarthritis    Psoriatic  arthritis ([place])    Current Outpatient Medications on File Prior to Visit  Medication Sig Dispense Refill   acetaminophen (TYLENOL) 500 MG tablet Take 500 mg by mouth every 8 (eight) hours as needed for moderate pain.     albuterol (VENTOLIN HFA) 108 (90 Base) MCG/ACT inhaler INHALE 2 PUFFS INTO THE LUNGS EVERY 4 HOURS AS NEEDED FOR COUGH, WHEEZING OR SHORTNESS OF BREATH 6.7 g 0   Alirocumab (PRALUENT) 75 MG/ML SOAJ Inject 75 mg into the skin every 14 (fourteen) days. 2 mL 1   Azelastine-Fluticasone 137-50 MCG/ACT SUSP PLACE 1 SPRAY INTO THE NOSE 2 (TWO) TIMES DAILY AS NEEDED. 23 g 1   b complex vitamins capsule Take 1 capsule by mouth every other day.     Cholecalciferol (VITAMIN D) 50 MCG (2000 UT) tablet Take 4,000 Units by mouth daily.     Coenzyme Q10 (COQ10) 100 MG CAPS Take 100 mg by mouth daily.     DULoxetine HCl 40 MG CSDR Take 1 tablet by mouth daily.     Ferrous Sulfate (IRON PO) Take by mouth.     fexofenadine (ALLEGRA) 180 MG tablet Take 180 mg by mouth daily.     hydrochlorothiazide (HYDRODIURIL) 25 MG tablet Take 1 tablet (25 mg total) by mouth daily. (Patient taking differently: Take 12.5 mg  by mouth daily.) 90 tablet 3   naphazoline-pheniramine (NAPHCON-A) 0.025-0.3 % ophthalmic solution Place 1 drop into both eyes 4 (four) times daily as needed for eye irritation or allergies.     polyethylene glycol (MIRALAX / GLYCOLAX) 17 g packet Take 17 g by mouth daily. 14 each 0   Potassium 99 MG TABS Take 2 tablets by mouth daily.     Accu-Chek Softclix Lancets lancets Test 1-2 times daily 100 each 1   atorvastatin (LIPITOR) 20 MG tablet Take 20 mg by mouth daily.     diclofenac Sodium (VOLTAREN) 1 % GEL APPLY 2 GRAMS TO AFFECTED AREA 4 TIMES A DAY     Glucose Blood (BLOOD GLUCOSE TEST STRIPS) STRP Test 1-2 times daily. Pt uses accu-chek guide me meter 100 strip 1   Phentermine-Topiramate (QSYMIA) 7.5-46 MG CP24 Take 1 capsule by mouth every morning. (Patient not taking: Reported on  05/12/2022) 30 capsule 1   No current facility-administered medications on file prior to visit.    ROS as in subjective   Objective: BP 110/70   Pulse 64   Wt 210 lb 6.4 oz (95.4 kg)   LMP 04/02/2011   BMI 35.01 kg/m   General: Well-developed well-nourished no acute distress Psych: Pleasant, answers questions appropriate, good eye contact    Assesment: Encounter Diagnoses  Name Primary?   Depressed mood Yes   Grief    Attention deficit     Plan: We discussed her history of depression in the past and prior treatments.  We discussed her current symptoms going on for the past year.  We discussed the importance of good coping mechanisms such as exercise, hobbies, spending time with friends, journaling, diary,  and counseling.  I strongly recommended she see a counselor and we will work to make that referral.  She has already started on duloxetine from her recent email to me.  She had this medication left over.  For now we will continue this although we may end up switching to Prozac if this does not seem to do as well as we would like.  She did well on Prozac in the past.  We discussed emergency or crisis care in the event if she had worsening symptoms.  Spent > 45 minutes face to face with patient in discussion of symptoms, evaluation, plan and recommendations.    F/u 3-4 weeks

## 2022-06-11 DIAGNOSIS — M5451 Vertebrogenic low back pain: Secondary | ICD-10-CM | POA: Diagnosis not present

## 2022-06-15 NOTE — Progress Notes (Unsigned)
Cardiology Office Note:    Date:  06/17/2022  NAME:  Paula Meyer    MRN: [id number] DOB:  [birth year]   PCP:  Carlena Hurl, PA-C  Cardiologist:  Evalina Field, MD  Electrophysiologist:  None   Referring MD: Carlena Hurl, PA-C   Chief Complaint  Patient presents with   Follow-up   History of Present Illness:    Paula Meyer is a 71 y.o. female with a hx of CAD, HTN, HLD who presents for follow-up.  She denies chest pain or trouble breathing.  Recently underwent back surgery.  And physical therapy.  They are getting her to exercise more.  No limitations.  Blood pressure is well controlled.  Cholesterol level is well controlled on Repatha.  She is still on Lipitor.  Okay to continue this for now.  No issues with liver enzymes.  She overall is doing well without any cardiovascular complaints.  Problem List HTN HLD -T chol 141, HDL 62, LDL 65, TG 71 -statin intolerant (liver enzyme elevation) -normal MPI 10/2021 3. CAD -calcium score 2,498 (99th percentile)  Past Medical History: Past Medical History:  Diagnosis Date   Allergy    Anemia    Anxiety    Aortic atherosclerosis ([place]) 2022   thoracic   Arthritis    Asthma    CAD (coronary artery disease)    Colitis    Coronary artery calcification seen on CT scan 07/2021   Depression    Heart murmur    Hx of colonic polyps    Hyperlipidemia    Hypertension    Osteoarthritis    Psoriatic arthritis ([place])     Past Surgical History: Past Surgical History:  Procedure Laterality Date   COLONOSCOPY  2018   LUMBAR LAMINECTOMY/DECOMPRESSION MICRODISCECTOMY N/A 04/09/2022   Procedure: Microlumbar decompression Lumbar five -Sacral one right;  Surgeon: Susa Day, MD;  Location: [place];  Service: Orthopedics;  Laterality: N/A;   TUBAL LIGATION     WISDOM TOOTH EXTRACTION      Current Medications: Current Meds  Medication Sig   Accu-Chek Softclix Lancets lancets Test 1-2 times daily   acetaminophen  (TYLENOL) 500 MG tablet Take 500 mg by mouth every 8 (eight) hours as needed for moderate pain.   albuterol (VENTOLIN HFA) 108 (90 Base) MCG/ACT inhaler INHALE 2 PUFFS INTO THE LUNGS EVERY 4 HOURS AS NEEDED FOR COUGH, WHEEZING OR SHORTNESS OF BREATH   Alirocumab (PRALUENT) 75 MG/ML SOAJ Inject 75 mg into the skin every 14 (fourteen) days.   atorvastatin (LIPITOR) 20 MG tablet Take 20 mg by mouth daily.   b complex vitamins capsule Take 1 capsule by mouth every other day.   Cholecalciferol (VITAMIN D) 50 MCG (2000 UT) tablet Take 4,000 Units by mouth daily.   diclofenac Sodium (VOLTAREN) 1 % GEL APPLY 2 GRAMS TO AFFECTED AREA 4 TIMES A DAY   DULoxetine (CYMBALTA) 60 MG capsule Take 1 capsule (60 mg total) by mouth daily.   DULoxetine HCl 40 MG CSDR Take 1 tablet by mouth daily.   Ferrous Sulfate (IRON PO) Take by mouth.   Glucose Blood (BLOOD GLUCOSE TEST STRIPS) STRP Test 1-2 times daily. Pt uses accu-chek guide me meter   golimumab (SIMPONI ARIA) 50 MG/4ML SOLN injection Inject 50 mg into the vein. Every 3 months   hydrochlorothiazide (HYDRODIURIL) 25 MG tablet Take 1 tablet (25 mg total) by mouth daily.   naphazoline-pheniramine (NAPHCON-A) 0.025-0.3 % ophthalmic solution Place 1 drop into both eyes  4 (four) times daily as needed for eye irritation or allergies.   polyethylene glycol (MIRALAX / GLYCOLAX) 17 g packet Take 17 g by mouth daily.   Potassium 99 MG TABS Take 2 tablets by mouth daily.     Allergies:    Paxil [paroxetine hcl], Shellfish allergy, Bupropion hcl, Contrast media [iodinated contrast media], Lisinopril, and Tramadol   Social History: Social History   Socioeconomic History   Marital status: Divorced    Spouse name: Not on file   Number of children: 2   Years of education: Not on file   Highest education level: Not on file  Occupational History   Not on file  Tobacco Use   Smoking status: Former    Years: 4.00    Types: Cigarettes    Quit date: 1989    Years  since quitting: 34.5   Smokeless tobacco: Never  Vaping Use   Vaping Use: Never used  Substance and Sexual Activity   Alcohol use: Not Currently    Alcohol/week: 3.0 standard drinks of alcohol    Types: 3 Glasses of wine per week    Comment: rarely   Drug use: No   Sexual activity: Not Currently  Other Topics Concern   Not on file  Social History Narrative   Lives alone and her dog.   Retired.   Children 2.   Has 3 story house.  02/2022.   Social Determinants of Health   Financial Resource Strain: Low Risk  (10/24/2021)   Overall Financial Resource Strain (CARDIA)    Difficulty of Paying Living Expenses: Not hard at all  Food Insecurity: No Food Insecurity (10/24/2021)   Hunger Vital Sign    Worried About Running Out of Food in the Last Year: Never true    Ran Out of Food in the Last Year: Never true  Transportation Needs: No Transportation Needs (10/24/2021)   PRAPARE - Hydrologist (Medical): No    Lack of Transportation (Non-Medical): No  Physical Activity: Inactive (10/24/2021)   Exercise Vital Sign    Days of Exercise per Week: 0 days    Minutes of Exercise per Session: 0 min  Stress: No Stress Concern Present (10/24/2021)   [place]    Feeling of Stress : Only a little  Social Connections: Not on file     Family History: The patient's family history includes Arthritis in an other family member; Depression in an other family member; Heart attack in her brother; Heart disease in her brother; Hyperlipidemia in her brother; Hypertension in her brother. There is no history of Colon cancer or Stomach cancer.  ROS:   All other ROS reviewed and negative. Pertinent positives noted in the HPI.    EKGs/Labs/Other Studies Reviewed:   The following studies were personally reviewed by me today:  TTE 10/21/2021  1. Left ventricular ejection fraction, by estimation, is 60 to 65%. The   left ventricle has normal function. The left ventricle has no regional  wall motion abnormalities. Left ventricular diastolic parameters were  normal.   2. Right ventricular systolic function is normal. The right ventricular  size is normal. Tricuspid regurgitation signal is inadequate for assessing  PA pressure.   3. The mitral valve is normal in structure. No evidence of mitral valve  regurgitation. No evidence of mitral stenosis.   4. The aortic valve is tricuspid. Aortic valve regurgitation is not  visualized. Mild aortic valve sclerosis is  present, with no evidence of  aortic valve stenosis.   5. The inferior vena cava is normal in size with greater than 50%  respiratory variability, suggesting right atrial pressure of 3 mmHg.   NM Stress 10/21/2021   The study is normal. The study is low risk.   No ST deviation was noted.   LV perfusion is normal. There is no evidence of ischemia. There is no evidence of infarction.   Left ventricular function is normal. Nuclear stress EF: 67 %. The left ventricular ejection fraction is hyperdynamic (>65%). End diastolic cavity size is normal. End systolic cavity size is normal.   Prior study not available for comparison.  Recent Labs: 10/03/2021: ALT 20 04/01/2022: BUN 15; Creatinine, Ser 0.84; Hemoglobin 11.3; Platelets 186; Potassium 3.5; Sodium 142   Recent Lipid Panel    Component Value Date/Time   CHOL 141 01/01/2022 1120   TRIG 71 01/01/2022 1120   HDL 62 01/01/2022 1120   CHOLHDL 2.3 01/01/2022 1120   CHOLHDL 6 08/23/2018 1651   VLDL 29.8 08/23/2018 1651   LDLCALC 65 01/01/2022 1120   LDLDIRECT 188.6 07/10/2013 1626    Physical Exam:   VS:  BP 138/88   Pulse 67   Ht '5\' 5"'$  (1.651 m)   Wt 204 lb 3.2 oz (92.6 kg)   LMP 04/02/2011   SpO2 97%   BMI 33.98 kg/m    Wt Readings from Last 3 Encounters:  06/17/22 204 lb 3.2 oz (92.6 kg)  06/10/22 210 lb 6.4 oz (95.4 kg)  05/12/22 208 lb 9.6 oz (94.6 kg)    General: Well  nourished, well developed, in no acute distress Head: Atraumatic, normal size  Eyes: PEERLA, EOMI  Neck: Supple, no JVD Endocrine: No thryomegaly Cardiac: Normal S1, S2; RRR; no murmurs, rubs, or gallops Lungs: Clear to auscultation bilaterally, no wheezing, rhonchi or rales  Abd: Soft, nontender, no hepatomegaly  Ext: No edema, pulses 2+ Musculoskeletal: No deformities, BUE and BLE strength normal and equal Skin: Warm and dry, no rashes   Neuro: Alert and oriented to person, place, time, and situation, CNII-XII grossly intact, no focal deficits  Psych: Normal mood and affect   ASSESSMENT:   Paula Meyer is a 71 y.o. female who presents for the following: 1. Coronary artery disease involving native coronary artery of native heart without angina pectoris   2. Agatston coronary artery calcium score greater than 400   3. Mixed hyperlipidemia     PLAN:   1. Coronary artery disease involving native coronary artery of native heart without angina pectoris 2. Agatston coronary artery calcium score greater than 400 3. Mixed hyperlipidemia -Severely elevated coronary calcium score of 2498 which is the 99th percentile.  Perfusion study is normal.  Echo was normal.  No symptoms of chest pain or trouble breathing.  Statin intolerant due to liver toxicity.  Currently on Repatha and low-dose Lipitor.  She will continue this.  Her LDL cholesterol was 65 which is at goal.  She has no cardiac complaints.  I have recommended regular exercise as well as proper diet.  She will work on both things.  All of her other CV risk factors are well controlled.  She will see [place] back yearly.  Disposition: Return in about 1 year (around 06/18/2023).  Medication Adjustments/Labs and Tests Ordered: Current medicines are reviewed at length with the patient today.  Concerns regarding medicines are outlined above.  No orders of the defined types were placed in this encounter.  No  orders of the defined types were placed  in this encounter.   Patient Instructions  Medication Instructions:  The current medical regimen is effective;  continue present plan and medications.  *If you need a refill on your cardiac medications before your next appointment, please call your pharmacy*  Follow-Up: At [facility], you and your health needs are our priority.  As part of our continuing mission to provide you with exceptional heart care, we have created designated Provider Care Teams.  These Care Teams include your primary Cardiologist (physician) and Advanced Practice Providers (APPs -  Physician Assistants and Nurse Practitioners) who all work together to provide you with the care you need, when you need it.  We recommend signing up for the patient portal called "MyChart".  Sign up information is provided on this After Visit Summary.  MyChart is used to connect with patients for Virtual Visits (Telemedicine).  Patients are able to view lab/test results, encounter notes, upcoming appointments, etc.  Non-urgent messages can be sent to your provider as well.   To learn more about what you can do with MyChart, go to [email or web address].    Your next appointment:   12 month(s)  The format for your next appointment:   In Person  Provider:   Evalina Field, MD or Sande Rives, PA-C, or Almyra Deforest, PA-C            Time Spent with Patient: I have spent a total of 25 minutes with patient reviewing hospital notes, telemetry, EKGs, labs and examining the patient as well as establishing an assessment and plan that was discussed with the patient.  > 50% of time was spent in direct patient care.  Signed, Addison Naegeli. Audie Box, MD, [place]  [address] [place], [place] [address] [phone]  06/17/2022 8:20 AM

## 2022-06-17 ENCOUNTER — Ambulatory Visit (INDEPENDENT_AMBULATORY_CARE_PROVIDER_SITE_OTHER): Payer: Medicare Other | Admitting: Cardiovascular Disease

## 2022-06-17 ENCOUNTER — Encounter (HOSPITAL_BASED_OUTPATIENT_CLINIC_OR_DEPARTMENT_OTHER): Payer: Self-pay | Admitting: Cardiovascular Disease

## 2022-06-17 VITALS — BP 138/88 | HR 67 | Ht 65.0 in | Wt 204.2 lb

## 2022-06-17 DIAGNOSIS — I2510 Atherosclerotic heart disease of native coronary artery without angina pectoris: Secondary | ICD-10-CM

## 2022-06-17 DIAGNOSIS — E782 Mixed hyperlipidemia: Secondary | ICD-10-CM

## 2022-06-17 DIAGNOSIS — R931 Abnormal findings on diagnostic imaging of heart and coronary circulation: Secondary | ICD-10-CM | POA: Diagnosis not present

## 2022-06-17 NOTE — Patient Instructions (Signed)
Medication Instructions:  The current medical regimen is effective;  continue present plan and medications.  *If you need a refill on your cardiac medications before your next appointment, please call your pharmacy*  Follow-Up: At [facility], you and your health needs are our priority.  As part of our continuing mission to provide you with exceptional heart care, we have created designated Provider Care Teams.  These Care Teams include your primary Cardiologist (physician) and Advanced Practice Providers (APPs -  Physician Assistants and Nurse Practitioners) who all work together to provide you with the care you need, when you need it.  We recommend signing up for the patient portal called "MyChart".  Sign up information is provided on this After Visit Summary.  MyChart is used to connect with patients for Virtual Visits (Telemedicine).  Patients are able to view lab/test results, encounter notes, upcoming appointments, etc.  Non-urgent messages can be sent to your provider as well.   To learn more about what you can do with MyChart, go to [email or web address].    Your next appointment:   12 month(s)  The format for your next appointment:   In Person  Provider:   Evalina Field, MD or Sande Rives, PA-C, or Almyra Deforest, [place]

## 2022-06-23 DIAGNOSIS — L4050 Arthropathic psoriasis, unspecified: Secondary | ICD-10-CM | POA: Diagnosis not present

## 2022-06-23 DIAGNOSIS — M5451 Vertebrogenic low back pain: Secondary | ICD-10-CM | POA: Diagnosis not present

## 2022-06-25 DIAGNOSIS — M5451 Vertebrogenic low back pain: Secondary | ICD-10-CM | POA: Diagnosis not present

## 2022-07-01 ENCOUNTER — Ambulatory Visit (INDEPENDENT_AMBULATORY_CARE_PROVIDER_SITE_OTHER): Payer: Medicare Other | Admitting: Medical

## 2022-07-01 VITALS — BP 120/72 | HR 81 | Wt 212.2 lb

## 2022-07-01 DIAGNOSIS — Z780 Asymptomatic menopausal state: Secondary | ICD-10-CM

## 2022-07-01 DIAGNOSIS — I700 Atherosclerosis of aorta: Secondary | ICD-10-CM

## 2022-07-01 DIAGNOSIS — E785 Hyperlipidemia, unspecified: Secondary | ICD-10-CM | POA: Diagnosis not present

## 2022-07-01 DIAGNOSIS — F418 Other specified anxiety disorders: Secondary | ICD-10-CM

## 2022-07-01 DIAGNOSIS — I2510 Atherosclerotic heart disease of native coronary artery without angina pectoris: Secondary | ICD-10-CM | POA: Diagnosis not present

## 2022-07-01 NOTE — Progress Notes (Signed)
Subjective: Chief Complaint  Patient presents with   follow-up     Follow-up on depression. Doing much better on '60mg'$    Here for f/u on mood.  Last visit 06/10/22 for depressed mood.   Since last visit, got appt scheduled for 07/18/22 with Dr. Cari Caraway, [facility] for counseling.   She started Cymbalta, and doing better since starting this.  Last visit she noted that she would lay in bed and not have any motivation to get up.  Now she is getting up and getting things done and not sleeping prolonged periods.  Last visit she felt like she had never really fully cope with the loss of her mother who passed away Apr 17, 2022.  She also withdrew from classes.  She was taking online classes prior to last visit but withdrew due to the mood and lack of focus.  She is doing physical therapy for her back but plans to get back to exercising regularly soon  She currently is taking Praluent and Lipitor.  In the past she had brain fog with Crestor.  She never started fenofibrate that was prescribed in the past.  She wants to know she can stop atorvastatin.  She said her cardiologist gave the option to stop or not take Lipitor.  She has been using Allegra but stopped this and instead doing eyedrops which seems to be helping her issue better.   Not currently taking Qsymia weight loss medication.  No other aggravating or relieving factors. No other complaint.  Past Medical History:  Diagnosis Date   Allergy    Anemia    Anxiety    Aortic atherosclerosis ([place]) 2022   thoracic   Arthritis    Asthma    CAD (coronary artery disease)    Colitis    Coronary artery calcification seen on CT scan 07/2021   Depression    Heart murmur    Hx of colonic polyps    Hyperlipidemia    Hypertension    Osteoarthritis    Psoriatic arthritis ([place])    Current Outpatient Medications on File Prior to Visit  Medication Sig Dispense Refill   albuterol (VENTOLIN HFA) 108 (90 Base) MCG/ACT inhaler INHALE 2 PUFFS INTO THE LUNGS  EVERY 4 HOURS AS NEEDED FOR COUGH, WHEEZING OR SHORTNESS OF BREATH 6.7 g 0   Alirocumab (PRALUENT) 75 MG/ML SOAJ Inject 75 mg into the skin every 14 (fourteen) days. 2 mL 1   atorvastatin (LIPITOR) 20 MG tablet Take 20 mg by mouth daily.     b complex vitamins capsule Take 1 capsule by mouth every other day.     Cholecalciferol (VITAMIN D) 50 MCG (2000 UT) tablet Take 4,000 Units by mouth daily.     diclofenac Sodium (VOLTAREN) 1 % GEL APPLY 2 GRAMS TO AFFECTED AREA 4 TIMES A DAY     DULoxetine (CYMBALTA) 60 MG capsule Take 1 capsule (60 mg total) by mouth daily. 30 capsule 1   Ferrous Sulfate (IRON PO) Take by mouth.     Glucose Blood (BLOOD GLUCOSE TEST STRIPS) STRP Test 1-2 times daily. Pt uses accu-chek guide me meter 100 strip 1   golimumab (SIMPONI ARIA) 50 MG/4ML SOLN injection Inject 50 mg into the vein. Every 3 months     hydrochlorothiazide (HYDRODIURIL) 25 MG tablet Take 1 tablet (25 mg total) by mouth daily. 90 tablet 3   naphazoline-pheniramine (NAPHCON-A) 0.025-0.3 % ophthalmic solution Place 1 drop into both eyes 4 (four) times daily as needed for eye irritation or allergies.  polyethylene glycol (MIRALAX / GLYCOLAX) 17 g packet Take 17 g by mouth daily. 14 each 0   Potassium 99 MG TABS Take 2 tablets by mouth daily.     Accu-Chek Softclix Lancets lancets Test 1-2 times daily 100 each 1   acetaminophen (TYLENOL) 500 MG tablet Take 500 mg by mouth every 8 (eight) hours as needed for moderate pain.     No current facility-administered medications on file prior to visit.    ROS as in subjective   Objective: BP 120/72   Pulse 81   Wt 212 lb 3.2 oz (96.3 kg)   LMP 04/02/2011   BMI 35.31 kg/m   Wt Readings from Last 3 Encounters:  07/01/22 212 lb 3.2 oz (96.3 kg)  06/17/22 204 lb 3.2 oz (92.6 kg)  06/10/22 210 lb 6.4 oz (95.4 kg)   General: Well-developed well-nourished no acute distress Psych: Ple[place], answers questions appropriate, good eye  contact    Assesment: Encounter Diagnoses  Name Primary?   Depression with anxiety Yes   Aortic atherosclerosis ([facility])    Hyperlipidemia, unspecified hyperlipidemia type    Post-menopause      Plan: Depressed mood-improved since starting Cymbalta.  Continue plan for first visit with counseling coming up in early August.  Continue working on other strategies to help with mood as we discussed last visit including exercise, hobbies, spending time with friends, journaling, diary,  and counseling.   Hyperlipidemia, aortic atherosclerosis-updated labs today.  She currently is on a atorvastatin plus Praluent.  She would like to stop her statin.  She notes that she had a discussion with cardiology about stopping the pills and she was on the [place].  Lets update labs today and then make some decisions.  F/u pending labs

## 2022-07-02 DIAGNOSIS — M5451 Vertebrogenic low back pain: Secondary | ICD-10-CM | POA: Diagnosis not present

## 2022-07-02 LAB — COMPREHENSIVE METABOLIC PANEL
ALT: 24 IU/L (ref 0–32)
AST: 22 IU/L (ref 0–40)
Albumin/Globulin Ratio: 1.7 (ref 1.2–2.2)
Albumin: 4.3 g/dL (ref 3.9–4.9)
Alkaline Phosphatase: 68 IU/L (ref 44–121)
BUN/Creatinine Ratio: 17 (ref 12–28)
BUN: 14 mg/dL (ref 8–27)
Bilirubin Total: 0.5 mg/dL (ref 0.0–1.2)
CO2: 27 mmol/L (ref 20–29)
Calcium: 9.4 mg/dL (ref 8.7–10.3)
Chloride: 102 mmol/L (ref 96–106)
Creatinine, Ser: 0.84 mg/dL (ref 0.57–1.00)
Globulin, Total: 2.6 g/dL (ref 1.5–4.5)
Glucose: 118 mg/dL — ABNORMAL HIGH (ref 70–99)
Potassium: 3.7 mmol/L (ref 3.5–5.2)
Sodium: 141 mmol/L (ref 134–144)
Total Protein: 6.9 g/dL (ref 6.0–8.5)
eGFR: 76 mL/min/{1.73_m2} (ref >59)

## 2022-07-02 LAB — LIPID PANEL
Chol/HDL Ratio: 3 ratio (ref 0.0–4.4)
Cholesterol, Total: 175 mg/dL (ref 100–199)
HDL: 59 mg/dL (ref >39)
LDL Chol Calc (NIH): 96 mg/dL (ref 0–99)
Triglycerides: 114 mg/dL (ref 0–149)
VLDL Cholesterol Cal: 20 mg/dL (ref 5–40)

## 2022-07-06 ENCOUNTER — Encounter: Payer: Medicare PPO | Admitting: Medical

## 2022-07-09 ENCOUNTER — Other Ambulatory Visit: Payer: Self-pay | Admitting: Medical

## 2022-07-17 DIAGNOSIS — N950 Postmenopausal bleeding: Secondary | ICD-10-CM | POA: Diagnosis not present

## 2022-07-17 DIAGNOSIS — N898 Other specified noninflammatory disorders of vagina: Secondary | ICD-10-CM | POA: Diagnosis not present

## 2022-07-20 ENCOUNTER — Ambulatory Visit (INDEPENDENT_AMBULATORY_CARE_PROVIDER_SITE_OTHER): Payer: Medicare Other | Admitting: Psychologist

## 2022-07-20 DIAGNOSIS — F330 Major depressive disorder, recurrent, mild: Secondary | ICD-10-CM

## 2022-07-20 DIAGNOSIS — Z634 Disappearance and death of family member: Secondary | ICD-10-CM

## 2022-07-20 NOTE — Progress Notes (Signed)
[place] Counselor Initial Adult Exam  Name: Paula Meyer Date: 07/20/2022 MRN: [id number] DOB: [birth year] PCP: Carlena Hurl, PA-C  Time spent: 11:00 am to 11:40 am; total time: 40 minutes  This session was held via in person. The patient consented to in-person therapy and was in the clinician's office. Limits of confidentiality were discussed with the patient.   Guardian/Payee:  NA    Paperwork requested: No   Reason for Visit /Presenting Problem: Depression and grief  Mental Status Exam: Appearance:   Well Groomed     Behavior:  Appropriate  Motor:  Normal  Speech/Language:   Clear and Coherent  Affect:  Appropriate  Mood:  normal  Thought process:  normal  Thought content:    WNL  Sensory/Perceptual disturbances:    WNL  Orientation:  oriented to person, place, and time/date  Attention:  Good  Concentration:  Good  Memory:  WNL  Fund of knowledge:   Good  Insight:    Good  Judgment:   Good  Impulse Control:  Good    Reported Symptoms:  The patient endorsed experiencing the following: feeling down, sad, tearful, social isolation, avoiding pleasurable activities, low self-esteem, rumination of negative thoughts, sleeping more, lack of motivation, and fatigue. She denied suicidal and homicidal ideation.   Risk Assessment: Danger to Self:  No Self-injurious Behavior: No Danger to Others: No Duty to Warn:no Physical Aggression / Violence:No  Access to Firearms a concern: No  Gang Involvement:No  Patient / guardian was educated about steps to take if suicide or homicide risk level increases between visits: n/a While future psychiatric events cannot be accurately predicted, the patient does not currently require acute inpatient psychiatric care and does not currently meet [facility] involuntary commitment criteria.  Substance Abuse History: Current substance abuse: No     Past Psychiatric History:   Previous psychological history is  significant for depression Outpatient Providers:NA History of Psych Hospitalization: No  Psychological Testing:  NA    Abuse History:  Victim of: No.,  NA    Report needed: No. Victim of Neglect:No. Perpetrator of  NA   Witness / Exposure to Domestic Violence: No   Protective Services Involvement: No  Witness to [employer] Violence:  No   Family History:  Family History  Problem Relation Age of Onset   Heart attack Brother    Hyperlipidemia Brother    Hypertension Brother    Heart disease Brother    Arthritis Other    Depression Other    Colon cancer Neg Hx    Stomach cancer Neg Hx     Living situation: the patient lives alone  Sexual Orientation: Straight  Relationship Status: single  Name of spouse / other:NA If a parent, number of children / ages:Patient has two daughters  Support Systems: Daughters  Museum/gallery curator Stress:  No   Income/Employment/Disability: Actor: No   Educational History: Education:  Patient is working her way towards a doctoral degree  Religion/Sprituality/World View: Christian/bapist  Any cultural differences that may affect / interfere with treatment:  not applicable   Recreation/Hobbies: Reading. Specifically reading about research  Stressors: Other: grief    Strengths: Supportive Relationships  Barriers:  NA   Legal History: Pending legal issue / charges: The patient has no significant history of legal issues. History of legal issue / charges:  Patient was arrested multiple years ago due to drug charges  Medical History/Surgical History: reviewed Past Medical History:  Diagnosis Date  Allergy    Anemia    Anxiety    Aortic atherosclerosis ([place]) 2022   thoracic   Arthritis    Asthma    CAD (coronary artery disease)    Colitis    Coronary artery calcification seen on CT scan 07/2021   Depression    Heart murmur    Hx of colonic polyps    Hyperlipidemia    Hypertension     Osteoarthritis    Psoriatic arthritis [facility])     Past Surgical History:  Procedure Laterality Date   COLONOSCOPY  2018   LUMBAR LAMINECTOMY/DECOMPRESSION MICRODISCECTOMY N/A 04/09/2022   Procedure: Microlumbar decompression Lumbar five -Sacral one right;  Surgeon: Susa Day, MD;  Location: [facility];  Service: Orthopedics;  Laterality: N/A;   TUBAL LIGATION     WISDOM TOOTH EXTRACTION      Medications: Current Outpatient Medications  Medication Sig Dispense Refill   Accu-Chek Softclix Lancets lancets Test 1-2 times daily 100 each 1   acetaminophen (TYLENOL) 500 MG tablet Take 500 mg by mouth every 8 (eight) hours as needed for moderate pain.     albuterol (VENTOLIN HFA) 108 (90 Base) MCG/ACT inhaler INHALE 2 PUFFS INTO THE LUNGS EVERY 4 HOURS AS NEEDED FOR COUGH, WHEEZING OR SHORTNESS OF BREATH 6.7 g 0   Alirocumab (PRALUENT) 75 MG/ML SOAJ Inject 75 mg into the skin every 14 (fourteen) days. 2 mL 1   atorvastatin (LIPITOR) 20 MG tablet Take 20 mg by mouth daily.     b complex vitamins capsule Take 1 capsule by mouth every other day.     Cholecalciferol (VITAMIN D) 50 MCG (2000 UT) tablet Take 4,000 Units by mouth daily.     diclofenac Sodium (VOLTAREN) 1 % GEL APPLY 2 GRAMS TO AFFECTED AREA 4 TIMES A DAY     DULoxetine (CYMBALTA) 60 MG capsule TAKE 1 CAPSULE BY MOUTH EVERY DAY 90 capsule 0   Ferrous Sulfate (IRON PO) Take by mouth.     Glucose Blood (BLOOD GLUCOSE TEST STRIPS) STRP Test 1-2 times daily. Pt uses accu-chek guide me meter 100 strip 1   golimumab (SIMPONI ARIA) 50 MG/4ML SOLN injection Inject 50 mg into the vein. Every 3 months     hydrochlorothiazide (HYDRODIURIL) 25 MG tablet Take 1 tablet (25 mg total) by mouth daily. 90 tablet 3   naphazoline-pheniramine (NAPHCON-A) 0.025-0.3 % ophthalmic solution Place 1 drop into both eyes 4 (four) times daily as needed for eye irritation or allergies.     polyethylene glycol (MIRALAX / GLYCOLAX) 17 g packet Take 17 g by mouth daily.  14 each 0   Potassium 99 MG TABS Take 2 tablets by mouth daily.     No current facility-administered medications for this visit.    Allergies  Allergen Reactions   Paxil [Paroxetine Hcl]     Makes her grind her teeth   Shellfish Allergy Shortness Of Breath and Itching   Bupropion Hcl Other (See Comments)    palpitations   Contrast Media [Iodinated Contrast Media] Hives and Itching   Crestor [Rosuvastatin]     Intolerance with brain fog, liver enzymes elevated   Lisinopril     cough   Tramadol     Dependency, avoid narcotics    Diagnoses:  F33.0 major depressive affective disorder, recurrent,  mild and G64.4 uncomplicated bereavement.   Plan of Care: The patient is a 71 year old Black woman who was referred due to experiencing depression and grief. The patient lives alone with her  dog. The patient meets criteria for a diagnosis of F33.0 major depressive affective disorder, recurrent, mild based off of the following: feeling down, sad, tearful, social isolation, avoiding pleasurable activities, low self-esteem, rumination of negative thoughts, sleeping more, lack of motivation, and fatigue. She denied suicidal and homicidal ideation. The patient also meets criteria for a diagnosis of W29.5 uncomplicated bereavement.   The patient stated that she wants to process her thoughts and emotions.   This psychologist makes the recommendation that the patient participate in therapy monthly.    Conception Chancy, PsyD

## 2022-07-20 NOTE — Plan of Care (Signed)
Goals °Alleviate depressive symptoms °Recognize, accept, and cope with depressive feelings °Develop healthy thinking patterns °Develop healthy interpersonal relationships ° °Objectives °Cooperate with a medication evaluation by a physician °Verbalize an accurate understanding of depression °Verbalize an understanding of the treatment °Identify and replace thoughts that support depression °Learn and implement behavioral strategies °Verbalize an understanding and resolution of current interpersonal problems °Learn and implement problem solving and decision making skills °Learn and implement conflict resolution skills to resolve interpersonal problems °Verbalize an understanding of healthy and unhealthy emotions verbalize insight into how past relationships may be influence current experiences with depression °Use mindfulness and acceptance strategies and increase value based behavior  °Increase hopeful statements about the future.  °Interventions °Consistent with treatment model, discuss how change in cognitive, behavioral, and interpersonal can help client alleviate depression °CBT °Behavioral activation help the client explore the relationship, nature of the dispute,  °Help the client develop new interpersonal skills and relationships °Conduct Problem so living therapy °Teach conflict resolution skills °Use a process-experiential approach °Conduct TLDP °Conduct ACT °Evaluate need for psychotropic medication °Monitor adherence to medication  ° °Goals °Begin a healthy grieving process °Objectives °Tell in detail the story of the current loss that is triggering symptoms °Read books on the topic of grief °Watch videos on the theme of grief °Begin verbalizing feelings associated with the loss °Attend a grief support group °express thoughts and feelings about the deceased °Identify and voice positives about the deceased °implement acts of spiritual faith  °Interventions °create a safe environment and actively build  trust °use empathy, compassion, and support °ask the patient to write a letter to the lost person °conduct empty chair °ask the patient to discuss and list the positives and negative aspects of the person °encourage patient to rely upon his/her spiritual faith  °ask client to read books on grief °ask patient to watch videos about grief °assist patient in identifying emotions  °ask patient to attend support group  ° °

## 2022-07-20 NOTE — Progress Notes (Signed)
                Cherril Hett, PsyD 

## 2022-07-27 DIAGNOSIS — N950 Postmenopausal bleeding: Secondary | ICD-10-CM | POA: Diagnosis not present

## 2022-08-10 ENCOUNTER — Ambulatory Visit (INDEPENDENT_AMBULATORY_CARE_PROVIDER_SITE_OTHER): Payer: Medicare Other | Admitting: Medical

## 2022-08-10 ENCOUNTER — Encounter: Payer: Self-pay | Admitting: Medical

## 2022-08-10 VITALS — BP 134/82 | HR 78 | Temp 98.5°F | Wt 202.2 lb

## 2022-08-10 DIAGNOSIS — E559 Vitamin D deficiency, unspecified: Secondary | ICD-10-CM | POA: Diagnosis not present

## 2022-08-10 DIAGNOSIS — Z1321 Encounter for screening for nutritional disorder: Secondary | ICD-10-CM | POA: Diagnosis not present

## 2022-08-10 DIAGNOSIS — R5383 Other fatigue: Secondary | ICD-10-CM

## 2022-08-10 DIAGNOSIS — F418 Other specified anxiety disorders: Secondary | ICD-10-CM

## 2022-08-10 DIAGNOSIS — I10 Essential (primary) hypertension: Secondary | ICD-10-CM | POA: Diagnosis not present

## 2022-08-10 DIAGNOSIS — I2510 Atherosclerotic heart disease of native coronary artery without angina pectoris: Secondary | ICD-10-CM | POA: Diagnosis not present

## 2022-08-10 MED ORDER — FLUOXETINE HCL 20 MG PO TABS: 20.0000 mg | ORAL_TABLET | Freq: Every day | ORAL | 2 refills | Status: DC

## 2022-08-10 NOTE — Progress Notes (Signed)
Subjective:  Paula Meyer is a 71 y.o. female who presents for Chief Complaint  Patient presents with   other    F/u on antidepressant      Here for f/u on mood, Cymbalta.  Was doing ok for a while, but started noticed she was biting her gum over and over .  Had problem with paxil prior grinding teeth.  Stopped the Cymbalta 3 weeks ago.  The gum biting stopped.    Has seen counselor.   Has only had 1 visit so far.  Regarding change in lipids in past year to higher levels, she attributes this to sausage.  She notes prior when she was sick, couldn't do much after back surgery in the past, her daughter would bring her a lot of sausage and she thinks this increased her potassium from prior test to the most recent test.   She still gets fatigued.  Wants magneium and vitamin D checked.   She has been reading about these.  No other aggravating or relieving factors.    No other c/o.  Past Medical History:  Diagnosis Date   Allergy    Anemia    Anxiety    Aortic atherosclerosis ([place]) 2022   thoracic   Arthritis    Asthma    CAD (coronary artery disease)    Colitis    Coronary artery calcification seen on CT scan 07/2021   Depression    Heart murmur    Hx of colonic polyps    Hyperlipidemia    Hypertension    Osteoarthritis    Psoriatic arthritis ([place])    Current Outpatient Medications on File Prior to Visit  Medication Sig Dispense Refill   Accu-Chek Softclix Lancets lancets Test 1-2 times daily 100 each 1   acetaminophen (TYLENOL) 500 MG tablet Take 500 mg by mouth every 8 (eight) hours as needed for moderate pain.     albuterol (VENTOLIN HFA) 108 (90 Base) MCG/ACT inhaler INHALE 2 PUFFS INTO THE LUNGS EVERY 4 HOURS AS NEEDED FOR COUGH, WHEEZING OR SHORTNESS OF BREATH 6.7 g 0   Alirocumab (PRALUENT) 75 MG/ML SOAJ Inject 75 mg into the skin every 14 (fourteen) days. 2 mL 1   atorvastatin (LIPITOR) 20 MG tablet Take 20 mg by mouth daily.     b complex vitamins capsule Take 1  capsule by mouth every other day.     Cholecalciferol (VITAMIN D) 50 MCG (2000 UT) tablet Take 4,000 Units by mouth daily.     diclofenac Sodium (VOLTAREN) 1 % GEL APPLY 2 GRAMS TO AFFECTED AREA 4 TIMES A DAY     Ferrous Sulfate (IRON PO) Take by mouth.     Glucose Blood (BLOOD GLUCOSE TEST STRIPS) STRP Test 1-2 times daily. Pt uses accu-chek guide me meter 100 strip 1   golimumab (SIMPONI ARIA) 50 MG/4ML SOLN injection Inject 50 mg into the vein. Every 3 months     hydrochlorothiazide (HYDRODIURIL) 25 MG tablet Take 1 tablet (25 mg total) by mouth daily. 90 tablet 3   magnesium oxide (MAG-OX) 400 (240 Mg) MG tablet Take 400 mg by mouth daily. '360mg'$      naphazoline-pheniramine (NAPHCON-A) 0.025-0.3 % ophthalmic solution Place 1 drop into both eyes 4 (four) times daily as needed for eye irritation or allergies.     Potassium 99 MG TABS Take 2 tablets by mouth daily.     polyethylene glycol (MIRALAX / GLYCOLAX) 17 g packet Take 17 g by mouth daily. (Patient not taking: Reported on  08/10/2022) 14 each 0   No current facility-administered medications on file prior to visit.     The following portions of the patient's history were reviewed and updated as appropriate: allergies, current medications, past family history, past medical history, past social history, past surgical history and problem list.  ROS Otherwise as in subjective above    Objective: BP 134/82   Pulse 78   Temp 98.5 F (36.9 C)   Wt 202 lb 3.2 oz (91.7 kg)   LMP 04/02/2011   BMI 33.65 kg/m   General appearance: alert, no distress, well developed, well nourished Psych: pleasant, answers questions approprietly   Assessment: Encounter Diagnoses  Name Primary?   Other fatigue Yes   Vitamin D deficiency disease    GAD (generalized anxiety disorder)    Depression with anxiety    Coronary artery disease involving native coronary artery of native heart without angina pectoris      Plan: Depression and anxiety -  she stopped Cymbalta 3 weeks ago.  She has prior tolerated prozac, but not paxil or Wellbutrin.  Continue counseling that was started recently.   Begin prozac that she has done well on in the past.  Fatigue - reviewed recent labs.  She wants magnesium and vit D checked  CAD - c/t statin and praluent.  Discussed diet   Crisol was seen today for other.  Diagnoses and all orders for this visit:  Other fatigue -     Magnesium  Vitamin D deficiency disease -     VITAMIN D 25 Hydroxy (Vit-D Deficiency, Fractures)  GAD (generalized anxiety disorder)  Depression with anxiety  Coronary artery disease involving native coronary artery of native heart without angina pectoris  Other orders -     FLUoxetine (PROZAC) 20 MG tablet; Take 1 tablet (20 mg total) by mouth daily. Start 1/2 tablet daily for the first 2 weeks, then change to 1 tablet daily    Follow up: pending labs

## 2022-08-11 LAB — VITAMIN D 25 HYDROXY (VIT D DEFICIENCY, FRACTURES): Vit D, 25-Hydroxy: 49.6 ng/mL (ref 30.0–100.0)

## 2022-08-11 LAB — MAGNESIUM: Magnesium: 2.1 mg/dL (ref 1.6–2.3)

## 2022-08-18 DIAGNOSIS — L4050 Arthropathic psoriasis, unspecified: Secondary | ICD-10-CM | POA: Diagnosis not present

## 2022-08-19 ENCOUNTER — Encounter: Payer: Self-pay | Admitting: Internal Medicine

## 2022-08-26 ENCOUNTER — Ambulatory Visit: Payer: Medicare Other | Admitting: Psychologist

## 2022-08-31 ENCOUNTER — Telehealth: Payer: Self-pay | Admitting: Internal Medicine

## 2022-08-31 NOTE — Telephone Encounter (Signed)
Pt called and states PA for Praluent is needed and she is due for injection tomorrow. Pt states she called and left a vm last week but never heard back from anyone, so calling today to follow-up. I advised her I was not here so I am not sure who took that message.    Paula Meyer- since pt is due tomorrow and if we can't get PA done since laura is out of the office today, what does pt need to do?

## 2022-08-31 NOTE — Telephone Encounter (Signed)
Paula Meyer,  Patient is aware that if she misses tomorrow she will be ok. Please check the status of this as she says it has refills at pharmacy but they need approval from insurance to fill it.

## 2022-09-01 ENCOUNTER — Telehealth: Payer: Self-pay | Admitting: Medical

## 2022-09-01 NOTE — Telephone Encounter (Signed)
P.A. Nelida Meuse completed

## 2022-09-01 NOTE — Telephone Encounter (Signed)
P.A. PRALUENT completed and Cover my meds states PA resolved, called pharmacy still wouldn't go thru.  Called plan they faxing PA form for completion

## 2022-09-02 NOTE — Telephone Encounter (Signed)
P.A. PRALUENT denied, pt must now have trial and failure of preferred medication Repatha.  Can pt be switched?

## 2022-09-03 ENCOUNTER — Other Ambulatory Visit: Payer: Self-pay | Admitting: Medical

## 2022-09-03 MED ORDER — REPATHA 140 MG/ML ~~LOC~~ SOSY
140.0000 mg | PREFILLED_SYRINGE | SUBCUTANEOUS | 3 refills | Status: DC
Start: 2022-09-03 — End: 2022-10-13

## 2022-09-03 NOTE — Telephone Encounter (Signed)
P.A. PRALUENT denied, pt must now have trial and failure of preferred medication Repatha.  Can pt be switched?

## 2022-09-04 ENCOUNTER — Telehealth: Payer: Self-pay | Admitting: Medical

## 2022-09-04 NOTE — Telephone Encounter (Signed)
P.A. REPATHA

## 2022-09-04 NOTE — Telephone Encounter (Signed)
Called pharmacy and Repatha now requiring P.A.

## 2022-09-12 NOTE — Telephone Encounter (Signed)
P.A. APPROVED TIL 09/04/23

## 2022-09-15 ENCOUNTER — Telehealth: Payer: Self-pay | Admitting: Cardiovascular Disease

## 2022-09-15 ENCOUNTER — Encounter: Payer: Self-pay | Admitting: Student

## 2022-09-15 DIAGNOSIS — I2510 Atherosclerotic heart disease of native coronary artery without angina pectoris: Secondary | ICD-10-CM | POA: Insufficient documentation

## 2022-09-15 NOTE — Telephone Encounter (Signed)
Left message for pt

## 2022-09-15 NOTE — Progress Notes (Unsigned)
Cardiology Office Note:    Date:  09/16/2022   ID:  Paula Meyer, DOB 11/16/1951, MRN [id number]  PCP:  Carlena Hurl, PA-C  Cardiologist:  Evalina Field, MD  Electrophysiologist:  None   Referring MD: Carlena Hurl, PA-C   Chief Complaint: palpitaitons  History of Present Illness:    Paula Meyer is a 71 y.o. female with a history of CAD with an elevated coronary calcium score in 07/2021 but negative Myoview in 10/2021, hypertension, hyperlipidemia, psoriatic arthritis, and anxiety/depression who is followed by Dr. Audie Box and presents today for evaluation of palpitations.  Patient was referred to Dr. Audie Box in 09/2021 for further evaluation of an elevated coronary calcium score. PCP ordered a coronary calcium score in 07/2021 which came back elevated at 2,498 placing her at the 99th percentile for age and sex. At that initial visit with Dr. Audie Box, patient denied any chest pain or shortness of breath but did report have severely elevated cholesterol her entire life as well as a family history of CAD. She was previously on a statin but this had to be stopped due to elevated LFTs and she had recently been started on Praluent. EKG in the office showed normal sinus rhythm with T wave inversions in inferior and anterior leads but patient reported she had had these T wave changes for years. Myoview and Echo were ordered for further evaluation.  Myoview was low risk with no evidence of ischemia and Echo LVEF of 60-65% with normal wall motion and diastolic parameters.  Patient was last seen by Dr. Audie Box in 06/2022 at which time she was doing well from a cardiac standpoint with no chest pain or shortness of breath. Her cholesterol was well controlled on Repatha. She had also been restarted on low dose Lipitor and was tolerating this well from a liver enzyme standpoint.  Patient presents today for evaluation of recent palpitations. Patient states she had back surgery in 03/2022 and since  then has just had decreased energy and trouble getting around. About 2-3 weeks ago, her daughter introduced her to the energy drink Celsius which contains '200mg'$  of caffeine. She drank about 1/3 of the can and did feel like she had more energy with this but also noticed palpitations that she described as "skipping beats." Around the same time, she also started taking an intermittent fasting supplement that also contained about '150mg'$  of caffeine per serving. Palpitations essentially resolved after she stopped of these.She denies any significant dizziness or syncope. She also reported 2 episodes of very brief sharp chest pain that only lasting for a couple of seconds and then resolved. This does not sound cardiac in nature. She also reports that sometime it feels like she cannot take a deep breath but denies any real shortness of breath. No orthopnea or PND. She does have history of lower extremity edema but it resolved with the Simponi injection for her psoriatic arthritis. She saw the sign on the door for PAD and initially thought this was the cause of her swell. However, she denies claudication and has good distal pedal pulses so I do not think this is PAD>  Past Medical History:  Diagnosis Date   Allergy    Anemia    Anxiety    Aortic atherosclerosis ([place]) 2022   thoracic   Arthritis    Asthma    CAD (coronary artery disease)    a. coronary calcium score was 2,498 (99th percentile for age and sex) in 07/2021; b. negative  Myoview in 10/2021   Colitis    Depression    Heart murmur    Hx of colonic polyps    Hyperlipidemia    Hypertension    Osteoarthritis    Psoriatic arthritis [facility])     Past Surgical History:  Procedure Laterality Date   COLONOSCOPY  2018   LUMBAR LAMINECTOMY/DECOMPRESSION MICRODISCECTOMY N/A 04/09/2022   Procedure: Microlumbar decompression Lumbar five -Sacral one right;  Surgeon: Susa Day, MD;  Location: [facility];  Service: Orthopedics;  Laterality: N/A;   TUBAL  LIGATION     WISDOM TOOTH EXTRACTION      Current Medications: Current Meds  Medication Sig   Accu-Chek Softclix Lancets lancets Test 1-2 times daily   acetaminophen (TYLENOL) 500 MG tablet Take 500 mg by mouth every 8 (eight) hours as needed for moderate pain.   albuterol (VENTOLIN HFA) 108 (90 Base) MCG/ACT inhaler INHALE 2 PUFFS INTO THE LUNGS EVERY 4 HOURS AS NEEDED FOR COUGH, WHEEZING OR SHORTNESS OF BREATH   atorvastatin (LIPITOR) 20 MG tablet Take 20 mg by mouth daily.   b complex vitamins capsule Take 1 capsule by mouth every other day.   Cholecalciferol (VITAMIN D) 50 MCG (2000 UT) tablet Take 4,000 Units by mouth daily.   diclofenac Sodium (VOLTAREN) 1 % GEL APPLY 2 GRAMS TO AFFECTED AREA 4 TIMES A DAY   Evolocumab (REPATHA) 140 MG/ML SOSY Inject 140 mg into the skin every 14 (fourteen) days.   FLUoxetine (PROZAC) 20 MG tablet Take 1 tablet (20 mg total) by mouth daily. Start 1/2 tablet daily for the first 2 weeks, then change to 1 tablet daily   Glucose Blood (BLOOD GLUCOSE TEST STRIPS) STRP Test 1-2 times daily. Pt uses accu-chek guide me meter   hydrochlorothiazide (HYDRODIURIL) 25 MG tablet Take 1 tablet (25 mg total) by mouth daily.   naphazoline-pheniramine (NAPHCON-A) 0.025-0.3 % ophthalmic solution Place 1 drop into both eyes 4 (four) times daily as needed for eye irritation or allergies.   Potassium 99 MG TABS Take 2 tablets by mouth daily.     Allergies:   Paxil [paroxetine hcl], Shellfish allergy, Bupropion hcl, Contrast media [iodinated contrast media], Crestor [rosuvastatin], Lisinopril, and Tramadol   Social History   Socioeconomic History   Marital status: Divorced    Spouse name: Not on file   Number of children: 2   Years of education: Not on file   Highest education level: Not on file  Occupational History   Not on file  Tobacco Use   Smoking status: Former    Years: 4.00    Types: Cigarettes    Quit date: 1989    Years since quitting: 34.7     Passive exposure: Never   Smokeless tobacco: Never  Vaping Use   Vaping Use: Never used  Substance and Sexual Activity   Alcohol use: Not Currently    Alcohol/week: 3.0 standard drinks of alcohol    Types: 3 Glasses of wine per week    Comment: rarely   Drug use: No   Sexual activity: Not Currently  Other Topics Concern   Not on file  Social History Narrative   Lives alone and her dog.   Retired.   Children 2.   Has 3 story house.  02/2022.   Social Determinants of Health   Financial Resource Strain: Low Risk  (10/24/2021)   Overall Financial Resource Strain (CARDIA)    Difficulty of Paying Living Expenses: Not hard at all  Food Insecurity: No Food Insecurity (  10/24/2021)   Hunger Vital Sign    Worried About Running Out of Food in the Last Year: Never true    [place] in the Last Year: Never true  Transportation Needs: No Transportation Needs (10/24/2021)   PRAPARE - Hydrologist (Medical): No    Lack of Transportation (Non-Medical): No  Physical Activity: Inactive (10/24/2021)   Exercise Vital Sign    Days of Exercise per Week: 0 days    Minutes of Exercise per Session: 0 min  Stress: No Stress Concern Present (10/24/2021)   Opp    Feeling of Stress : Only a little  Social Connections: Not on file     Family History: The patient's family history includes Arthritis in an other family member; Depression in an other family member; Heart attack in her brother; Heart disease in her brother; Hyperlipidemia in her brother; Hypertension in her brother. There is no history of Colon cancer or Stomach cancer.  ROS:   Please see the history of present illness.     EKGs/Labs/Other Studies Reviewed:    The following studies were reviewed:  CT Cardiac Scoring 07/30/2021: Impressions: Total Agatston score: 2,498 [place] database percentile: 99 Partially calcified nodule in the  left breast which cannot be characterized by CT. Recommend correlation with mammography. Aortic atherosclerosis. _______________  Echocardiogram 10/21/2021: Impressions:  1. Left ventricular ejection fraction, by estimation, is 60 to 65%. The  left ventricle has normal function. The left ventricle has no regional  wall motion abnormalities. Left ventricular diastolic parameters were  normal.   2. Right ventricular systolic function is normal. The right ventricular  size is normal. Tricuspid regurgitation signal is inadequate for assessing  PA pressure.   3. The mitral valve is normal in structure. No evidence of mitral valve  regurgitation. No evidence of mitral stenosis.   4. The aortic valve is tricuspid. Aortic valve regurgitation is not  visualized. Mild aortic valve sclerosis is present, with no evidence of  aortic valve stenosis.   5. The inferior vena cava is normal in size with greater than 50%  respiratory variability, suggesting right atrial pressure of 3 mmHg.   Comparison(s): Prior images unable to be directly viewed, comparison made  by report only.  _______________  Myoview 10/21/2021:   The study is normal. The study is low risk.   No ST deviation was noted.   LV perfusion is normal. There is no evidence of ischemia. There is no evidence of infarction.   Left ventricular function is normal. Nuclear stress EF: 67 %. The left ventricular ejection fraction is hyperdynamic (>65%). End diastolic cavity size is normal. End systolic cavity size is normal.   Prior study not available for comparison.   Negative Lexiscan myoview.   EKG:  EKG ordered today. EKG personally reviewed and demonstrates normal sinus rhythm, rate 67 bpm, with 1st degree AV block and T wave inversions in precordial leads. Normal axis. QTc 462 ms. No significant changes compared to prior tracing.  Recent Labs: 04/01/2022: Hemoglobin 11.3; Platelets 186 07/01/2022: ALT 24; BUN 14; Creatinine, Ser 0.84;  Potassium 3.7; Sodium 141 08/10/2022: Magnesium 2.1  Recent Lipid Panel    Component Value Date/Time   CHOL 175 07/01/2022 1207   TRIG 114 07/01/2022 1207   HDL 59 07/01/2022 1207   CHOLHDL 3.0 07/01/2022 1207   CHOLHDL 6 08/23/2018 1651   VLDL 29.8 08/23/2018 1651   LDLCALC 96 07/01/2022 1207  LDLDIRECT 188.6 07/10/2013 1626    Physical Exam:    Vital Signs: BP 132/88 (BP Location: Right Arm, Patient Position: Sitting, Cuff Size: Large)   Pulse 70   Ht '5\' 5"'$  (1.651 m)   Wt 211 lb (95.7 kg)   LMP 04/02/2011   SpO2 97%   BMI 35.11 kg/m     Wt Readings from Last 3 Encounters:  09/16/22 211 lb (95.7 kg)  08/10/22 202 lb 3.2 oz (91.7 kg)  07/01/22 212 lb 3.2 oz (96.3 kg)     General: 71 y.o. African-American female in no acute distress. HEENT: Normocephalic and atraumatic. Sclera clear.  Neck: Supple. No JVD. Heart: RRR. Distinct S1 and S2. No murmurs, gallops, or rubs. Radial and distal pedal pulses 2+ and equal bilaterally. Lungs: No increased work of breathing. Clear to ausculation bilaterally. No wheezes, rhonchi, or rales.  Abdomen: Soft, non-distended, and non-tender to palpation.  MSK: Normal strength and tone for age.  Extremities: No lower extremity edema.    Skin: Warm and dry. Neuro: Alert and oriented x3. No focal deficits. Psych: Normal affect. Responds appropriately.  Assessment:    1. Palpitations   2. Atypical chest pain   3. Coronary artery disease involving native coronary artery of native heart without angina pectoris   4. Primary hypertension   5. Hyperlipidemia, unspecified hyperlipidemia type   6. Obesity (BMI 30-39.9)     Plan:    Palpitations Patient reported palpitations that she described as "skipped beats" which sounds like premature beats. This seemed to be associated with high caffeine intake and resolved after stopping this.  - EKG shows normal sinus rhythm with no ectopy.  - I think this was all due to caffeine intake. Recommended  limiting caffeine intake. No additional work-up necessary at this time. If she has recurrence, can consider monitor (especially if it is not related to caffeine).   Atypical Chest Pain CAD Coronary calcium score in 07/2021 was elevated at 2,498 placing her at the 99th percentile for age and sex. Myoview in 10/2021 was low risk with no evidence of ischemia. - Patient describes 2 episodes of very atypical chest pain that lasted only a couple of seconds at a time. - Continue aspirin and statin/Repatha. - Symptoms do not sound cardiac in nature. No additional work-up necessary.   Hypertension BP well controlled. - Continue HCTZ '25mg'$  daily.  Hyperlipidemia Most recent lipid panel in 06/2022: Total Cholesterol 175, Triglycerides 114, HDL 59, LDL 96. LDL goal <70 given CAD. - Continue Repatha and Lipitor '20mg'$  daily.  - LDL was previously at goal and went back up. Patient states this was due to a poor diet. Recommended diet modification.  - Followed by PCP.   Obesity Patient reports having a lot decreased energy since back surgery earlier this year and is discouraged by her weight gain. BMI 35. She is not able to be as active due to continue back pain. She has some degree of deconditioning.  - Recommended increasing physical activity as much as possible. Recommended reaching back out to Ortho to see if they have any other recommendations for her continued pain.  - Recommend diet modifications. - Patient was wondering if she could be prescribed an appetite suppressant. Recommended discussing this with PCP. Would be cautious though using anything with phentermine as this can cause palpitations.    Disposition: Follow up in 6 months.   Medication Adjustments/Labs and Tests Ordered: Current medicines are reviewed at length with the patient today.  Concerns regarding medicines  are outlined above.  No orders of the defined types were placed in this encounter.  No orders of the defined types were  placed in this encounter.   Patient Instructions  Medication Instructions:  Your physician recommends that you continue on your current medications as directed. Please refer to the Current Medication list given to you today.  *If you need a refill on your cardiac medications before your next appointment, please call your pharmacy*  Follow-Up: At [facility], you and your health needs are our priority.  As part of our continuing mission to provide you with exceptional heart care, we have created designated Provider Care Teams.  These Care Teams include your primary Cardiologist (physician) and Advanced Practice Providers (APPs -  Physician Assistants and Nurse Practitioners) who all work together to provide you with the care you need, when you need it.  We recommend signing up for the patient portal called "MyChart".  Sign up information is provided on this After Visit Summary.  MyChart is used to connect with patients for Virtual Visits (Telemedicine).  Patients are able to view lab/test results, encounter notes, upcoming appointments, etc.  Non-urgent messages can be sent to your provider as well.   To learn more about what you can do with MyChart, go to [email or web address].    Your next appointment:   6 month(s)  The format for your next appointment:   In Person  Provider:   Sande Rives, PA-C   Other Instructions:  Decrease caffeine  Watch the diet and increase activity as you can.     Signed, Darreld Mclean, PA-C  09/16/2022 11:59 PM    [facility]

## 2022-09-15 NOTE — Telephone Encounter (Signed)
Spoke to patient she stated she has been having palpitations and chest pain off and on for the past 3 weeks.Stated palpitations and chest pain better since she stopped taking a diet supplement she ordered online.She also stopped energy drink.She will keep appointment with Sande Rives PA 10/4 at 2:45 pm.

## 2022-09-15 NOTE — Telephone Encounter (Signed)
Patient c/o Palpitations:  High priority if patient c/o lightheadedness, shortness of breath, or chest pain  How long have you had palpitations/irregular HR/ Afib? Are you having the symptoms now?  Yes, started the week before last  but not as frequent now. She states she was taking an intermittent fasting drink and when she stopped the palpitations got better but they didn't go away completely.   Are you currently experiencing lightheadedness, SOB or CP? No, but feeling tired, not all the time but it comes and goes. Pt states she had a couple episodes of chest pain, around the time she was taking the fasting drink but not currently.   Do you have a history of afib (atrial fibrillation) or irregular heart rhythm? No   Have you checked your BP or HR? (document readings if available): no   Are you experiencing any other symptoms? No   Pt states she was told by Dr. Audie Box that she can f/u with APP, pt made an appt for tomorrow at 2:45pm with Sarajane Jews

## 2022-09-16 ENCOUNTER — Ambulatory Visit: Payer: Medicare Other | Attending: Student | Admitting: Student

## 2022-09-16 ENCOUNTER — Encounter: Payer: Self-pay | Admitting: Student

## 2022-09-16 VITALS — BP 132/88 | HR 70 | Ht 65.0 in | Wt 211.0 lb

## 2022-09-16 DIAGNOSIS — I10 Essential (primary) hypertension: Secondary | ICD-10-CM | POA: Diagnosis not present

## 2022-09-16 DIAGNOSIS — I2510 Atherosclerotic heart disease of native coronary artery without angina pectoris: Secondary | ICD-10-CM | POA: Diagnosis not present

## 2022-09-16 DIAGNOSIS — R002 Palpitations: Secondary | ICD-10-CM | POA: Insufficient documentation

## 2022-09-16 DIAGNOSIS — R0789 Other chest pain: Secondary | ICD-10-CM | POA: Insufficient documentation

## 2022-09-16 DIAGNOSIS — E669 Obesity, unspecified: Secondary | ICD-10-CM | POA: Diagnosis not present

## 2022-09-16 DIAGNOSIS — E785 Hyperlipidemia, unspecified: Secondary | ICD-10-CM | POA: Diagnosis not present

## 2022-09-16 NOTE — Patient Instructions (Signed)
Medication Instructions:  Your physician recommends that you continue on your current medications as directed. Please refer to the Current Medication list given to you today.  *If you need a refill on your cardiac medications before your next appointment, please call your pharmacy*  Follow-Up: At [facility], you and your health needs are our priority.  As part of our continuing mission to provide you with exceptional heart care, we have created designated Provider Care Teams.  These Care Teams include your primary Cardiologist (physician) and Advanced Practice Providers (APPs -  Physician Assistants and Nurse Practitioners) who all work together to provide you with the care you need, when you need it.  We recommend signing up for the patient portal called "MyChart".  Sign up information is provided on this After Visit Summary.  MyChart is used to connect with patients for Virtual Visits (Telemedicine).  Patients are able to view lab/test results, encounter notes, upcoming appointments, etc.  Non-urgent messages can be sent to your provider as well.   To learn more about what you can do with MyChart, go to [email or web address].    Your next appointment:   6 month(s)  The format for your next appointment:   In Person  Provider:   Sande Rives, PA-C   Other Instructions:  Decrease caffeine  Watch the diet and increase activity as you can.

## 2022-09-17 NOTE — Addendum Note (Signed)
Addended by: Sande Rives on: 09/17/2022 12:00 AM   Modules accepted: Level of Service

## 2022-09-21 NOTE — Addendum Note (Signed)
Addended by: Dionicio Stall E on: 09/21/2022 10:42 AM   Modules accepted: Orders

## 2022-09-22 ENCOUNTER — Encounter: Payer: Self-pay | Admitting: Internal Medicine

## 2022-10-03 ENCOUNTER — Telehealth: Payer: Self-pay | Admitting: Medical

## 2022-10-03 NOTE — Telephone Encounter (Signed)
Pt called & left message that she is having issues with Repatha causing side effects of memory issues and would like to go back to Praluent

## 2022-10-09 NOTE — Telephone Encounter (Signed)
P.A. PRALUENT completed with including the new issues with the Repatha

## 2022-10-12 NOTE — Telephone Encounter (Unsigned)
Medical records were requested for P.A. they were sent to insurance company

## 2022-10-13 DIAGNOSIS — L4050 Arthropathic psoriasis, unspecified: Secondary | ICD-10-CM | POA: Diagnosis not present

## 2022-10-13 MED ORDER — PRALUENT 150 MG/ML ~~LOC~~ SOAJ: 140.0000 mg | SUBCUTANEOUS | 0 refills | Status: DC

## 2022-10-13 NOTE — Telephone Encounter (Signed)
Praluent approved til 10/14/23, called pharmacy went thru for $35 for 3 months.  Called pt and informed

## 2022-10-21 ENCOUNTER — Other Ambulatory Visit: Payer: Self-pay | Admitting: Medical

## 2022-10-30 ENCOUNTER — Ambulatory Visit: Payer: Medicare Other

## 2022-11-04 ENCOUNTER — Encounter: Payer: Self-pay | Admitting: Gastroenterology

## 2022-11-12 DIAGNOSIS — Z1231 Encounter for screening mammogram for malignant neoplasm of breast: Secondary | ICD-10-CM | POA: Diagnosis not present

## 2022-11-12 LAB — HM MAMMOGRAPHY

## 2022-11-13 ENCOUNTER — Other Ambulatory Visit: Payer: Self-pay | Admitting: Internal Medicine

## 2022-11-13 ENCOUNTER — Ambulatory Visit (INDEPENDENT_AMBULATORY_CARE_PROVIDER_SITE_OTHER): Payer: Medicare Other

## 2022-11-13 ENCOUNTER — Other Ambulatory Visit: Payer: Self-pay

## 2022-11-13 VITALS — Ht 64.75 in | Wt 208.0 lb

## 2022-11-13 DIAGNOSIS — Z0000 Encounter for general adult medical examination without abnormal findings: Secondary | ICD-10-CM

## 2022-11-13 DIAGNOSIS — I10 Essential (primary) hypertension: Secondary | ICD-10-CM

## 2022-11-13 MED ORDER — HYDROCHLOROTHIAZIDE 25 MG PO TABS: 25.0000 mg | ORAL_TABLET | Freq: Every day | ORAL | 1 refills | Status: DC

## 2022-11-13 MED ORDER — ATORVASTATIN CALCIUM 20 MG PO TABS
20.0000 mg | ORAL_TABLET | Freq: Every day | ORAL | 1 refills | Status: DC
  Filled 2022-11-13: qty 90, 90d supply, fill #0

## 2022-11-13 NOTE — Telephone Encounter (Signed)
Refilled med

## 2022-11-13 NOTE — Telephone Encounter (Signed)
From: [name] To: Office of Dorothea Ogle, [place] Sent: 11/13/2022 1:13 AM EST Subject: Medication Renewal Request  Refills have been requested for the following medications:   atorvastatin (LIPITOR) 20 MG tablet  Preferred pharmacy: [facility] Delivery method: Arlyss Gandy

## 2022-11-13 NOTE — Progress Notes (Signed)
I connected with Paula Meyer today by telephone and verified that I am speaking with the correct person using two identifiers. Location patient: home Location provider: work Persons participating in the virtual visit: patient, provider.   I discussed the limitations, risks, security and privacy concerns of performing an evaluation and management service by telephone and the availability of in person appointments. I also discussed with the patient that there may be a patient responsible charge related to this service. The patient expressed understanding and verbally consented to this telephonic visit.    Interactive audio and video telecommunications were attempted between this provider and patient, however failed, due to patient having technical difficulties OR patient did not have access to video capability.  We continued and completed visit with audio only.     Vital signs may be patient reported or missing.  Subjective:   Paula Meyer is a 71 y.o. female who presents for Medicare Annual (Subsequent) preventive examination.  Review of Systems     Cardiac Risk Factors include: advanced age (>64mn, >>58women);hypertension;obesity (BMI >30kg/m2)     Objective:    Today's Vitals   11/13/22 1056  Weight: 208 lb (94.3 kg)  Height: 5' 4.75" (1.645 m)   Body mass index is 34.88 kg/m.     11/13/2022   11:04 AM 04/09/2022    9:07 AM 04/01/2022    9:19 AM 10/24/2021    2:23 PM 02/29/2020   11:20 AM 06/15/2016   12:59 PM  Advanced Directives  Does Patient Have a Medical Advance Directive? No No No Yes No No  Type of Advance Directive    [place] Chart?    No - copy requested    Would patient like information on creating a medical advance directive?  No - Patient declined Yes (MAU/Ambulatory/Procedural Areas - Information given)  Yes (ED - Information included in AVS) No - patient declined information    Current  Medications (verified) Outpatient Encounter Medications as of 11/13/2022  Medication Sig   Accu-Chek Softclix Lancets lancets Test 1-2 times daily   acetaminophen (TYLENOL) 500 MG tablet Take 500 mg by mouth every 8 (eight) hours as needed for moderate pain.   albuterol (VENTOLIN HFA) 108 (90 Base) MCG/ACT inhaler INHALE 2 PUFFS INTO THE LUNGS EVERY 4 HOURS AS NEEDED FOR COUGH, WHEEZING OR SHORTNESS OF BREATH   Alirocumab (PRALUENT) 150 MG/ML SOAJ Inject 140 mg into the skin every 14 (fourteen) days.   atorvastatin (LIPITOR) 20 MG tablet Take 1 tablet (20 mg total) by mouth daily.   b complex vitamins capsule Take 1 capsule by mouth every other day.   Cholecalciferol (VITAMIN D) 50 MCG (2000 UT) tablet Take 4,000 Units by mouth daily.   diclofenac Sodium (VOLTAREN) 1 % GEL APPLY 2 GRAMS TO AFFECTED AREA 4 TIMES A DAY   Glucose Blood (BLOOD GLUCOSE TEST STRIPS) STRP Test 1-2 times daily. Pt uses accu-chek guide me meter   hydrochlorothiazide (HYDRODIURIL) 25 MG tablet Take 1 tablet (25 mg total) by mouth daily.   naphazoline-pheniramine (NAPHCON-A) 0.025-0.3 % ophthalmic solution Place 1 drop into both eyes 4 (four) times daily as needed for eye irritation or allergies.   polyethylene glycol (MIRALAX / GLYCOLAX) 17 g packet Take 17 g by mouth daily.   Potassium 99 MG TABS Take 2 tablets by mouth daily.   Ferrous Sulfate (IRON PO) Take by mouth. (Patient not taking: Reported on 09/16/2022)   FLUoxetine (  PROZAC) 20 MG tablet Take 1 tablet (20 mg total) by mouth daily. Start 1/2 tablet daily for the first 2 weeks, then change to 1 tablet daily (Patient not taking: Reported on 11/13/2022)   golimumab (SIMPONI ARIA) 50 MG/4ML SOLN injection Inject 50 mg into the vein. Every 3 months (Patient not taking: Reported on 11/13/2022)   magnesium oxide (MAG-OX) 400 (240 Mg) MG tablet Take 400 mg by mouth daily. '360mg'$  (Patient not taking: Reported on 09/16/2022)   No facility-administered encounter medications on  file as of 11/13/2022.    Allergies (verified) Paxil [paroxetine hcl], Shellfish allergy, Bupropion hcl, Contrast media [iodinated contrast media], Crestor [rosuvastatin], Lisinopril, and Tramadol   History: Past Medical History:  Diagnosis Date   Allergy    Anemia    Anxiety    Aortic atherosclerosis ([place]) 2022   thoracic   Arthritis    Asthma    CAD (coronary artery disease)    a. coronary calcium score was 2,498 (99th percentile for age and sex) in 07/2021; b. negative Myoview in 10/2021   Colitis    Depression    Heart murmur    Hx of colonic polyps    Hyperlipidemia    Hypertension    Osteoarthritis    Psoriatic arthritis ([place])    Past Surgical History:  Procedure Laterality Date   COLONOSCOPY  2018   LUMBAR LAMINECTOMY/DECOMPRESSION MICRODISCECTOMY N/A 04/09/2022   Procedure: Microlumbar decompression Lumbar five -Sacral one right;  Surgeon: Susa Day, MD;  Location: [place];  Service: Orthopedics;  Laterality: N/A;   TUBAL LIGATION     WISDOM TOOTH EXTRACTION     Family History  Problem Relation Age of Onset   Heart attack Brother    Hyperlipidemia Brother    Hypertension Brother    Heart disease Brother    Arthritis Other    Depression Other    Colon cancer Neg Hx    Stomach cancer Neg Hx    Social History   Socioeconomic History   Marital status: Divorced    Spouse name: Not on file   Number of children: 2   Years of education: Not on file   Highest education level: Not on file  Occupational History   Not on file  Tobacco Use   Smoking status: Former    Years: 4.00    Types: Cigarettes    Quit date: 1989    Years since quitting: 34.9    Passive exposure: Never   Smokeless tobacco: Never  Vaping Use   Vaping Use: Never used  Substance and Sexual Activity   Alcohol use: Not Currently    Alcohol/week: 3.0 standard drinks of alcohol    Types: 3 Glasses of wine per week    Comment: rarely   Drug use: No   Sexual activity: Not Currently   Other Topics Concern   Not on file  Social History Narrative   Lives alone and her dog.   Retired.   Children 2.   Has 3 story house.  02/2022.   Social Determinants of Health   Financial Resource Strain: Low Risk  (11/13/2022)   Overall Financial Resource Strain (CARDIA)    Difficulty of Paying Living Expenses: Not hard at all  Food Insecurity: No Food Insecurity (11/13/2022)   Hunger Vital Sign    Worried About Running Out of Food in the Last Year: Never true    Ran Out of Food in the Last Year: Never true  Transportation Needs: No Transportation Needs (11/13/2022)  PRAPARE - Hydrologist (Medical): No    Lack of Transportation (Non-Medical): No  Physical Activity: Inactive (11/13/2022)   Exercise Vital Sign    Days of Exercise per Week: 0 days    Minutes of Exercise per Session: 0 min  Stress: No Stress Concern Present (11/13/2022)   Halfway    Feeling of Stress : Not at all  Social Connections: Not on file    Tobacco Counseling Counseling given: Not Answered   Clinical Intake:  Pre-visit preparation completed: Yes  Pain : No/denies pain     Nutritional Status: BMI > 30  Obese Nutritional Risks: None Diabetes: No  How often do you need to have someone help you when you read instructions, pamphlets, or other written materials from your doctor or pharmacy?: 1 - Never  Diabetic? no  Interpreter Needed?: No  Information entered by :: NAllen LPN   Activities of Daily Living    11/13/2022   11:08 AM 04/01/2022    9:23 AM  In your present state of health, do you have any difficulty performing the following activities:  Hearing? 0   Vision? 0   Difficulty concentrating or making decisions? 0   Walking or climbing stairs? 0   Dressing or bathing? 0   Doing errands, shopping? 0 0  Preparing Food and eating ? N   Using the Toilet? N   In the past six months, have you  accidently leaked urine? N   Do you have problems with loss of bowel control? N   Managing your Medications? N   Managing your Finances? N   Housekeeping or managing your Housekeeping? N     Patient Care Team: Tysinger, Camelia Eng, PA-C as PCP - General (Family Medicine) O'Neal, Cassie Freer, MD as PCP - Cardiology (Cardiology) Allyn Kenner, DO as Consulting Physician (Obstetrics and Gynecology) Susa Day, MD as Consulting Physician (Orthopedic Surgery)  Indicate any recent Medical Services you may have received from other than [facility] providers in the past year (date may be approximate).     Assessment:   This is a routine wellness examination for [place].  Hearing/Vision screen Vision Screening - Comments:: No regular eye exams,  Dietary issues and exercise activities discussed: Current Exercise Habits: The patient does not participate in regular exercise at present   Goals Addressed             This Visit's Progress    Patient Stated       11/13/2022, wants to be able to walk and get heart rate up       Depression Screen    11/13/2022   11:05 AM 08/10/2022    1:30 PM 07/01/2022   11:42 AM 06/10/2022   11:25 AM 03/05/2022    8:31 AM 10/24/2021    2:28 PM 08/19/2021   10:19 AM  PHQ 2/9 Scores  PHQ - 2 Score 0 2 0 2 0 0 1  PHQ- 9 Score 0 4 0 17 0      Fall Risk    11/13/2022   11:05 AM 07/01/2022   11:41 AM 03/05/2022    8:33 AM 11/05/2021   11:12 AM 10/24/2021    2:26 PM  Fall Risk   Falls in the past year? 0 0 0 0 0  Number falls in past yr: 0 0 0 0   Injury with Fall? 0 0 0 0   Risk for fall due to :  Medication side effect No Fall Risks No Fall Risks No Fall Risks Medication side effect  Follow up Falls prevention discussed;Education provided;Falls evaluation completed Falls evaluation completed Falls evaluation completed Falls evaluation completed Falls evaluation completed;Education provided;Falls prevention discussed    FALL RISK PREVENTION PERTAINING  TO THE HOME:  Any stairs in or around the home? Yes  If so, are there any without handrails? No  Home free of loose throw rugs in walkways, pet beds, electrical cords, etc? Yes  Adequate lighting in your home to reduce risk of falls? Yes   ASSISTIVE DEVICES UTILIZED TO PREVENT FALLS:  Life alert? No  Use of a cane, walker or w/c? No  Grab bars in the bathroom? No  Shower chair or bench in shower? Yes  Elevated toilet seat or a handicapped toilet? Yes   TIMED UP AND GO:  Was the test performed? No .      Cognitive Function:        11/13/2022   11:09 AM 10/24/2021    2:36 PM 02/29/2020   11:11 AM  6CIT Screen  What Year? 0 points 0 points 0 points  What month? 0 points 0 points 0 points  What time? 0 points 0 points 0 points  Count back from 20 0 points 0 points 0 points  Months in reverse 0 points 0 points 0 points  Repeat phrase 2 points 0 points 0 points  Total Score 2 points 0 points 0 points    Immunizations Immunization History  Administered Date(s) Administered   Influenza, High Dose Seasonal PF 01/12/2019   PFIZER(Purple Top)SARS-COV-2 Vaccination 02/04/2020, 02/28/2020   PNEUMOCOCCAL CONJUGATE-20 03/05/2022   Pneumococcal Conjugate-13 08/23/2018   Pneumococcal Polysaccharide-23 07/10/2013   Tdap 04/02/2011    TDAP status: Due, Education has been provided regarding the importance of this vaccine. Advised may receive this vaccine at local pharmacy or Health Dept. Aware to provide a copy of the vaccination record if obtained from local pharmacy or Health Dept. Verbalized acceptance and understanding.  Flu Vaccine status: Due, Education has been provided regarding the importance of this vaccine. Advised may receive this vaccine at local pharmacy or Health Dept. Aware to provide a copy of the vaccination record if obtained from local pharmacy or Health Dept. Verbalized acceptance and understanding.  Pneumococcal vaccine status: Up to date  Covid-19 vaccine  status: Completed vaccines  Qualifies for Shingles Vaccine? Yes   Zostavax completed No   Shingrix Completed?: No.    Education has been provided regarding the importance of this vaccine. Patient has been advised to call insurance company to determine out of pocket expense if they have not yet received this vaccine. Advised may also receive vaccine at local pharmacy or Health Dept. Verbalized acceptance and understanding.  Screening Tests Health Maintenance  Topic Date Due   Zoster Vaccines- Shingrix (1 of 2) Never done   COVID-19 Vaccine (3 - Pfizer risk series) 03/27/2020   DTaP/Tdap/Td (2 - Td or Tdap) 04/01/2021   INFLUENZA VACCINE  07/14/2022   Medicare Annual Wellness (AWV)  10/24/2022   COLONOSCOPY (Pts 45-10yr Insurance coverage will need to be confirmed)  10/26/2022   MAMMOGRAM  11/01/2023   Pneumonia Vaccine 71 Years old  Completed   DEXA SCAN  Completed   HPV VACCINES  Aged Out   Hepatitis C Screening  Discontinued    Health Maintenance  Health Maintenance Due  Topic Date Due   Zoster Vaccines- Shingrix (1 of 2) Never done   COVID-19 Vaccine (3 -  Pfizer risk series) 03/27/2020   DTaP/Tdap/Td (2 - Td or Tdap) 04/01/2021   INFLUENZA VACCINE  07/14/2022   Medicare Annual Wellness (AWV)  10/24/2022   COLONOSCOPY (Pts 45-15yr Insurance coverage will need to be confirmed)  10/26/2022    Colorectal cancer screening: Type of screening: Colonoscopy. Completed 10/26/2017. Repeat every 5 years  Mammogram status: Completed 11/12/2022. Repeat every year  Bone Density status: Completed 07/07/2019.   Lung Cancer Screening: (Low Dose CT Chest recommended if Age 69-80years, 30 pack-year currently smoking OR have quit w/in 15years.) does not qualify.   Lung Cancer Screening Referral: no  Additional Screening:  Hepatitis C Screening: does qualify;   Vision Screening: Recommended annual ophthalmology exams for early detection of glaucoma and other disorders of the eye. Is  the patient up to date with their annual eye exam?  Yes  Who is the provider or what is the name of the office in which the patient attends annual eye exams?  If pt is not established with a provider, would they like to be referred to a provider to establish care? No .   Dental Screening: Recommended annual dental exams for proper oral hygiene  Community Resource Referral / Chronic Care Management: CRR required this visit?  No   CCM required this visit?  No      Plan:     I have personally reviewed and noted the following in the patient's chart:   Medical and social history Use of alcohol, tobacco or illicit drugs  Current medications and supplements including opioid prescriptions. Patient is not currently taking opioid prescriptions. Functional ability and status Nutritional status Physical activity Advanced directives List of other physicians Hospitalizations, surgeries, and ER visits in previous 12 months Vitals Screenings to include cognitive, depression, and falls Referrals and appointments  In addition, I have reviewed and discussed with patient certain preventive protocols, quality metrics, and best practice recommendations. A written personalized care plan for preventive services as well as general preventive health recommendations were provided to patient.     NKellie Simmering LPN   [id number]  Nurse Notes: none  Due to this being a virtual visit, the after visit summary with patients personalized plan was offered to patient via mail or my-chart.  Patient would like to access on my-chart

## 2022-11-13 NOTE — Patient Instructions (Signed)
Paula Meyer , Thank you for taking time to come for your Medicare Wellness Visit. I appreciate your ongoing commitment to your health goals. Please review the following plan we discussed and let me know if I can assist you in the future.   Screening recommendations/referrals: Colonoscopy: completed 10/26/2017, due 10/26/2022 Mammogram: completed 11/12/2022, due 11/14/2023 Bone Density: completed 07/07/2019 Recommended yearly ophthalmology/optometry visit for glaucoma screening and checkup Recommended yearly dental visit for hygiene and checkup  Vaccinations: Influenza vaccine: due Pneumococcal vaccine: completed 03/05/2022 Tdap vaccine: due Shingles vaccine: discussed   Covid-19: 02/28/2020, 02/04/2020  Advanced directives: Advance directive discussed with you today.  Conditions/risks identified: none  Next appointment: Follow up in one year for your annual wellness visit    Preventive Care 65 Years and Older, Female Preventive care refers to lifestyle choices and visits with your health care provider that can promote health and wellness. What does preventive care include? A yearly physical exam. This is also called an annual well check. Dental exams once or twice a year. Routine eye exams. Ask your health care provider how often you should have your eyes checked. Personal lifestyle choices, including: Daily care of your teeth and gums. Regular physical activity. Eating a healthy diet. Avoiding tobacco and drug use. Limiting alcohol use. Practicing safe sex. Taking low-dose aspirin every day. Taking vitamin and mineral supplements as recommended by your health care provider. What happens during an annual well check? The services and screenings done by your health care provider during your annual well check will depend on your age, overall health, lifestyle risk factors, and family history of disease. Counseling  Your health care provider may ask you questions about your: Alcohol  use. Tobacco use. Drug use. Emotional well-being. Home and relationship well-being. Sexual activity. Eating habits. History of falls. Memory and ability to understand (cognition). Work and work Statistician. Reproductive health. Screening  You may have the following tests or measurements: Height, weight, and BMI. Blood pressure. Lipid and cholesterol levels. These may be checked every 5 years, or more frequently if you are over 58 years old. Skin check. Lung cancer screening. You may have this screening every year starting at age 37 if you have a 30-pack-year history of smoking and currently smoke or have quit within the past 15 years. Fecal occult blood test (FOBT) of the stool. You may have this test every year starting at age 82. Flexible sigmoidoscopy or colonoscopy. You may have a sigmoidoscopy every 5 years or a colonoscopy every 10 years starting at age 44. Hepatitis C blood test. Hepatitis B blood test. Sexually transmitted disease (STD) testing. Diabetes screening. This is done by checking your blood sugar (glucose) after you have not eaten for a while (fasting). You may have this done every 1-3 years. Bone density scan. This is done to screen for osteoporosis. You may have this done starting at age 4. Mammogram. This may be done every 1-2 years. Talk to your health care provider about how often you should have regular mammograms. Talk with your health care provider about your test results, treatment options, and if necessary, the need for more tests. Vaccines  Your health care provider may recommend certain vaccines, such as: Influenza vaccine. This is recommended every year. Tetanus, diphtheria, and acellular pertussis (Tdap, Td) vaccine. You may need a Td booster every 10 years. Zoster vaccine. You may need this after age 93. Pneumococcal 13-valent conjugate (PCV13) vaccine. One dose is recommended after age 76. Pneumococcal polysaccharide (PPSV23) vaccine. One dose is  recommended after age 63. Talk to your health care provider about which screenings and vaccines you need and how often you need them. This information is not intended to replace advice given to you by your health care provider. Make sure you discuss any questions you have with your health care provider. Document Released: 12/27/2015 Document Revised: 08/19/2016 Document Reviewed: 10/01/2015 Elsevier Interactive Patient Education  2017 [place] Prevention in the Home Falls can cause injuries. They can happen to people of all ages. There are many things you can do to make your home safe and to help prevent falls. What can I do on the outside of my home? Regularly fix the edges of walkways and driveways and fix any cracks. Remove anything that might make you trip as you walk through a door, such as a raised step or threshold. Trim any bushes or trees on the path to your home. Use bright outdoor lighting. Clear any walking paths of anything that might make someone trip, such as rocks or tools. Regularly check to see if handrails are loose or broken. Make sure that both sides of any steps have handrails. Any raised decks and porches should have guardrails on the edges. Have any leaves, snow, or ice cleared regularly. Use sand or salt on walking paths during winter. Clean up any spills in your garage right away. This includes oil or grease spills. What can I do in the bathroom? Use night lights. Install grab bars by the toilet and in the tub and shower. Do not use towel bars as grab bars. Use non-skid mats or decals in the tub or shower. If you need to sit down in the shower, use a plastic, non-slip stool. Keep the floor dry. Clean up any water that spills on the floor as soon as it happens. Remove soap buildup in the tub or shower regularly. Attach bath mats securely with double-sided non-slip rug tape. Do not have throw rugs and other things on the floor that can make you  trip. What can I do in the bedroom? Use night lights. Make sure that you have a light by your bed that is easy to reach. Do not use any sheets or blankets that are too big for your bed. They should not hang down onto the floor. Have a firm chair that has side arms. You can use this for support while you get dressed. Do not have throw rugs and other things on the floor that can make you trip. What can I do in the kitchen? Clean up any spills right away. Avoid walking on wet floors. Keep items that you use a lot in easy-to-reach places. If you need to reach something above you, use a strong step stool that has a grab bar. Keep electrical cords out of the way. Do not use floor polish or wax that makes floors slippery. If you must use wax, use non-skid floor wax. Do not have throw rugs and other things on the floor that can make you trip. What can I do with my stairs? Do not leave any items on the stairs. Make sure that there are handrails on both sides of the stairs and use them. Fix handrails that are broken or loose. Make sure that handrails are as long as the stairways. Check any carpeting to make sure that it is firmly attached to the stairs. Fix any carpet that is loose or worn. Avoid having throw rugs at the top or bottom of the stairs. If you  do have throw rugs, attach them to the floor with carpet tape. Make sure that you have a light switch at the top of the stairs and the bottom of the stairs. If you do not have them, ask someone to add them for you. What else can I do to help prevent falls? Wear shoes that: Do not have high heels. Have rubber bottoms. Are comfortable and fit you well. Are closed at the toe. Do not wear sandals. If you use a stepladder: Make sure that it is fully opened. Do not climb a closed stepladder. Make sure that both sides of the stepladder are locked into place. Ask someone to hold it for you, if possible. Clearly mark and make sure that you can  see: Any grab bars or handrails. First and last steps. Where the edge of each step is. Use tools that help you move around (mobility aids) if they are needed. These include: Canes. Walkers. Scooters. Crutches. Turn on the lights when you go into a dark area. Replace any light bulbs as soon as they burn out. Set up your furniture so you have a clear path. Avoid moving your furniture around. If any of your floors are uneven, fix them. If there are any pets around you, be aware of where they are. Review your medicines with your doctor. Some medicines can make you feel dizzy. This can increase your chance of falling. Ask your doctor what other things that you can do to help prevent falls. This information is not intended to replace advice given to you by your health care provider. Make sure you discuss any questions you have with your health care provider. Document Released: 09/26/2009 Document Revised: 05/07/2016 Document Reviewed: 01/04/2015 Elsevier Interactive Patient Education  2017 [employer].

## 2022-11-23 ENCOUNTER — Other Ambulatory Visit (HOSPITAL_COMMUNITY): Payer: Self-pay

## 2022-11-30 ENCOUNTER — Encounter: Payer: Self-pay | Admitting: Internal Medicine

## 2022-12-18 ENCOUNTER — Telehealth (INDEPENDENT_AMBULATORY_CARE_PROVIDER_SITE_OTHER): Payer: Medicare Other | Admitting: Nurse Practitioner

## 2022-12-18 DIAGNOSIS — J069 Acute upper respiratory infection, unspecified: Secondary | ICD-10-CM | POA: Insufficient documentation

## 2022-12-18 DIAGNOSIS — J4531 Mild persistent asthma with (acute) exacerbation: Secondary | ICD-10-CM

## 2022-12-18 DIAGNOSIS — B9689 Other specified bacterial agents as the cause of diseases classified elsewhere: Secondary | ICD-10-CM | POA: Diagnosis not present

## 2022-12-18 MED ORDER — DM-GUAIFENESIN ER 30-600 MG PO TB12: 2.0000 | ORAL_TABLET | Freq: Every day | ORAL | 2 refills | Status: DC | PRN

## 2022-12-18 MED ORDER — ALBUTEROL SULFATE HFA 108 (90 BASE) MCG/ACT IN AERS: INHALATION_SPRAY | RESPIRATORY_TRACT | 4 refills | Status: DC

## 2022-12-18 MED ORDER — AZITHROMYCIN 250 MG PO TABS: ORAL_TABLET | ORAL | 0 refills | Status: AC

## 2022-12-18 MED ORDER — PREDNISONE 20 MG PO TABS: 40.0000 mg | ORAL_TABLET | Freq: Every day | ORAL | 0 refills | Status: DC

## 2022-12-18 NOTE — Progress Notes (Signed)
Virtual Visit Encounter mychart visit.   I connected with  Paula Meyer on 12/18/22 at  8:15 AM EST by secure video and audio telemedicine application. I verified that I am speaking with the correct person using two identifiers.   I introduced myself as a Designer, jewellery with the practice. The limitations of evaluation and management by telemedicine discussed with the patient and the availability of in person appointments. The patient expressed verbal understanding and consent to proceed.  Participating parties in this visit include: Myself and patient  The patient is: Patient Location: Home I am: Provider Location: Office/Clinic Subjective:    CC and HPI: Paula Meyer is a 71 y.o. year old female presenting for new evaluation and treatment of cough and wheezing. Patient reports the following: She has had a virus for the past nine days. She started with a headache, then a tickle in throat and chest, chills, wheezing and coughing (currently wet). Pt reports to have had a fever for three days, but no longer. She tells me now her symptoms are primarily cough and wheezing. She is not feeling short of breath or dizzy. She tells me it feels "itchy in the lungs". She was taking nighttime nyquil and using her inhaler. She feels like that helps, but the symptoms won't go away. She was exposed to RSV at a party about 2 weeks ago and over half of the people present have had similar symptoms.     Past medical history, Surgical history, Family history not pertinant except as noted below, Social history, Allergies, and medications have been entered into the medical record, reviewed, and corrections made.   Review of Systems:  All review of systems negative except what is listed in the HPI  Objective:    Alert and oriented x 4 Speaking in clear sentences with no shortness of breath. No distress.  Impression and Recommendations:    Problem List Items Addressed This Visit   None Visit  Diagnoses     Bacterial URI    -  Primary   Relevant Medications   azithromycin (ZITHROMAX) 250 MG tablet   predniSONE (DELTASONE) 20 MG tablet   dextromethorphan-guaiFENesin (MUCINEX DM) 30-600 MG 12hr tablet       orders and follow up as documented in EMR I discussed the assessment and treatment plan with the patient. The patient was provided an opportunity to ask questions and all were answered. The patient agreed with the plan and demonstrated an understanding of the instructions.   The patient was advised to call back or seek an in-person evaluation if the symptoms worsen or if the condition fails to improve as anticipated.  Follow-Up: prn  I provided 24 minutes of non-face-to-face interaction with this non face-to-face encounter including intake, same-day documentation, and chart review.   Orma Render, NP , DNP, AGNP-c [facility]

## 2022-12-18 NOTE — Assessment & Plan Note (Signed)
Exposure to RSV about 2 weeks ago with symptom onset about 10 days ago. Symptoms have improved somewhat, but the cough, wheezing, chest congestion, and mucous production are lingering. Given her prolonged symptoms and the increased mucous and wheezing, I am concerned for secondary bacterial infection. We will begin treatment today with azithromycin, prednisone, and mucinex. I have also refilled her albuterol inhaler. Recommend increased hydration and rest through the weekend. If symptoms are not significantly improved by the first of the week, she will follow-up. Consider CXR at that time.

## 2023-01-04 ENCOUNTER — Other Ambulatory Visit: Payer: Self-pay | Admitting: Medical

## 2023-01-06 ENCOUNTER — Telehealth: Payer: Self-pay | Admitting: Medical

## 2023-01-06 NOTE — Telephone Encounter (Signed)
Pt called & states her Praluent refill was denied,  I spoke with [place] she denied because the dosage was wrong was for 75 mg & chart shows '150mg'$ .  I called pt and she states the Repatha was '140mg'$  that she was previously on but the Praluent was '75mg'$ .  I had her recheck the pen in her hand and read it and it was for '75mg'$  and says she has always taken '75mg'$  every 2 weeks.   Audelia Acton do you want her to stay on the '75mg'$  every 2 weeks or increase to '150mg'$  ? And when would you like her to come back in for follow up?

## 2023-01-11 NOTE — Telephone Encounter (Signed)
Called Pt & she is ok with increasing Praluent to '150mg'$ ,  please send to [place]

## 2023-01-12 ENCOUNTER — Other Ambulatory Visit: Payer: Self-pay | Admitting: Medical

## 2023-01-12 MED ORDER — PRALUENT 150 MG/ML ~~LOC~~ SOAJ: 150.0000 mg | SUBCUTANEOUS | 11 refills | Status: DC

## 2023-01-22 NOTE — Telephone Encounter (Signed)
Pt was previously called & agreed to switch to higher dose

## 2023-02-01 ENCOUNTER — Ambulatory Visit (INDEPENDENT_AMBULATORY_CARE_PROVIDER_SITE_OTHER): Payer: Medicare Other | Admitting: Medical

## 2023-02-01 ENCOUNTER — Encounter: Payer: Self-pay | Admitting: Medical

## 2023-02-01 VITALS — BP 138/70 | HR 76 | Temp 99.0°F | Resp 16 | Wt 217.6 lb

## 2023-02-01 DIAGNOSIS — I10 Essential (primary) hypertension: Secondary | ICD-10-CM | POA: Diagnosis not present

## 2023-02-01 DIAGNOSIS — G8929 Other chronic pain: Secondary | ICD-10-CM | POA: Diagnosis not present

## 2023-02-01 DIAGNOSIS — L4050 Arthropathic psoriasis, unspecified: Secondary | ICD-10-CM

## 2023-02-01 DIAGNOSIS — M546 Pain in thoracic spine: Secondary | ICD-10-CM | POA: Diagnosis not present

## 2023-02-01 DIAGNOSIS — R768 Other specified abnormal immunological findings in serum: Secondary | ICD-10-CM | POA: Diagnosis not present

## 2023-02-01 DIAGNOSIS — M1990 Unspecified osteoarthritis, unspecified site: Secondary | ICD-10-CM

## 2023-02-01 DIAGNOSIS — M62830 Muscle spasm of back: Secondary | ICD-10-CM | POA: Diagnosis not present

## 2023-02-01 DIAGNOSIS — R7689 Other specified abnormal immunological findings in serum: Secondary | ICD-10-CM | POA: Insufficient documentation

## 2023-02-01 DIAGNOSIS — D649 Anemia, unspecified: Secondary | ICD-10-CM | POA: Diagnosis not present

## 2023-02-01 LAB — CBC WITH DIFFERENTIAL/PLATELET
Basophils Absolute: 0 10*3/uL (ref 0.0–0.2)
Basos: 0 %
EOS (ABSOLUTE): 0.1 10*3/uL (ref 0.0–0.4)
Eos: 2 %
Hematocrit: 36.3 % (ref 34.0–46.6)
Hemoglobin: 12.1 g/dL (ref 11.1–15.9)
Immature Grans (Abs): 0 10*3/uL (ref 0.0–0.1)
Immature Granulocytes: 0 %
Lymphocytes Absolute: 2 10*3/uL (ref 0.7–3.1)
Lymphs: 43 %
MCH: 27.8 pg (ref 26.6–33.0)
MCHC: 33.3 g/dL (ref 31.5–35.7)
MCV: 83 fL (ref 79–97)
Monocytes Absolute: 0.4 10*3/uL (ref 0.1–0.9)
Monocytes: 9 %
Neutrophils Absolute: 2.1 10*3/uL (ref 1.4–7.0)
Neutrophils: 46 %
Platelets: 211 10*3/uL (ref 150–450)
RBC: 4.35 x10E6/uL (ref 3.77–5.28)
RDW: 13.1 % (ref 11.7–15.4)
WBC: 4.7 10*3/uL (ref 3.4–10.8)

## 2023-02-01 LAB — POCT URINALYSIS DIP (PROADVANTAGE DEVICE)
Bilirubin, UA: NEGATIVE
Blood, UA: NEGATIVE
Glucose, UA: NEGATIVE mg/dL
Ketones, POC UA: NEGATIVE mg/dL
Nitrite, UA: NEGATIVE
Protein Ur, POC: NEGATIVE mg/dL
Specific Gravity, Urine: 1.01
Urobilinogen, Ur: 0.2
pH, UA: 7 (ref 5.0–8.0)

## 2023-02-01 MED ORDER — HYDROCODONE-ACETAMINOPHEN 5-325 MG PO TABS: 1.0000 | ORAL_TABLET | Freq: Two times a day (BID) | ORAL | 0 refills | Status: DC | PRN

## 2023-02-01 NOTE — Patient Instructions (Addendum)
Massage therapy:  Kneaded by Bevelyn Ngo [phone]   [email or web address]   Ernestene Mention [facility] [phone]   Check insurance for coverage for Zepbound or [id number] injectable weight loss medication

## 2023-02-01 NOTE — Progress Notes (Signed)
Subjective:  Paula Meyer is a 72 y.o. female who presents for Chief Complaint  Patient presents with   Joint Pain    Complains of knee pain and back pain for several months. Wants a referral to as specialist.      Here for concerns.    Sees rheumatology, has arthritis, been using Simponi injection every 3 months through rheumatology.   Recent one of her labs, creatinine was elevated.  This made her nervous as one of her family members on the same drug Simponi recently had kidney damage and had to get started on dialysis.   Lately having mid back pain bilat, gets a catch in her mid back.  She would like some other remedies to help with back pain.  She has seen chiropractor before who recommended massage therapy.  She has not done massage therapy in a while  Hypertension-compliant with hydrochlorothiazide.  She does not check her blood pressures regularly at home  She wants her kidneys rechecked  Hyperlipidemia-compliant with statin and Praluent injection  No other aggravating or relieving factors.    No other c/o.  Past Medical History:  Diagnosis Date   Allergy    Anemia    Anxiety    Aortic atherosclerosis ([place]) 2022   thoracic   Arthritis    Asthma    CAD (coronary artery disease)    a. coronary calcium score was 2,498 (99th percentile for age and sex) in 07/2021; b. negative Myoview in 10/2021   Colitis    Depression    Heart murmur    Hx of colonic polyps    Hyperlipidemia    Hypertension    Osteoarthritis    Psoriatic arthritis ([place])    Current Outpatient Medications on File Prior to Visit  Medication Sig Dispense Refill   Accu-Chek Softclix Lancets lancets Test 1-2 times daily 100 each 1   acetaminophen (TYLENOL) 500 MG tablet Take 500 mg by mouth every 8 (eight) hours as needed for moderate pain.     albuterol (VENTOLIN HFA) 108 (90 Base) MCG/ACT inhaler INHALE 2 PUFFS INTO THE LUNGS EVERY 4 HOURS AS NEEDED FOR COUGH, WHEEZING OR SHORTNESS OF BREATH 8 g 4    Alirocumab (PRALUENT) 150 MG/ML SOAJ Inject 150 mg into the skin every 14 (fourteen) days. 2 mL 11   atorvastatin (LIPITOR) 20 MG tablet Take 1 tablet (20 mg total) by mouth daily. 90 tablet 1   b complex vitamins capsule Take 1 capsule by mouth every other day.     Cholecalciferol (VITAMIN D) 50 MCG (2000 UT) tablet Take 4,000 Units by mouth daily.     diclofenac Sodium (VOLTAREN) 1 % GEL APPLY 2 GRAMS TO AFFECTED AREA 4 TIMES A DAY     Ferrous Sulfate (IRON PO) Take by mouth.     FLUoxetine (PROZAC) 20 MG tablet Take 1 tablet (20 mg total) by mouth daily. Start 1/2 tablet daily for the first 2 weeks, then change to 1 tablet daily 90 tablet 2   Glucose Blood (BLOOD GLUCOSE TEST STRIPS) STRP Test 1-2 times daily. Pt uses accu-chek guide me meter 100 strip 1   hydrochlorothiazide (HYDRODIURIL) 25 MG tablet Take 1 tablet (25 mg total) by mouth daily. 90 tablet 1   magnesium oxide (MAG-OX) 400 (240 Mg) MG tablet Take 400 mg by mouth daily. 380m     naphazoline-pheniramine (NAPHCON-A) 0.025-0.3 % ophthalmic solution Place 1 drop into both eyes 4 (four) times daily as needed for eye irritation or allergies.  polyethylene glycol (MIRALAX / GLYCOLAX) 17 g packet Take 17 g by mouth daily. 14 each 0   Potassium 99 MG TABS Take 2 tablets by mouth daily.     dextromethorphan-guaiFENesin (MUCINEX DM) 30-600 MG 12hr tablet Take 2 tablets by mouth daily as needed for cough. Daytime use (Patient not taking: Reported on 02/01/2023) 20 tablet 2   golimumab (SIMPONI ARIA) 50 MG/4ML SOLN injection Inject 50 mg into the vein. Every 3 months (Patient not taking: Reported on 12/18/2022)     No current facility-administered medications on file prior to visit.     The following portions of the patient's history were reviewed and updated as appropriate: allergies, current medications, past family history, past medical history, past social history, past surgical history and problem list.  ROS Otherwise as in  subjective above    Objective: BP 138/70   Pulse 76   Temp 99 F (37.2 C) (Tympanic)   Resp 16   Wt 217 lb 9.6 oz (98.7 kg)   LMP 04/02/2011   SpO2 97% Comment: room air  BMI 36.49 kg/m   BP Readings from Last 3 Encounters:  02/01/23 138/70  09/16/22 132/88  08/10/22 134/82   Wt Readings from Last 3 Encounters:  02/01/23 217 lb 9.6 oz (98.7 kg)  11/13/22 208 lb (94.3 kg)  09/16/22 211 lb (95.7 kg)    General appearance: alert, no distress, well developed, well nourished Heart: RRR, normal S1, S2, no murmurs Lungs: CTA bilaterally, no wheezes, rhonchi, or rales Back:, Flexion to about 100 degrees, extension a little bit decreased, nontender with range of motion, she is tender in the paraspinal region of the thoracic spine area, no midline bony tenderness, no swelling or deformity Pulses: 2+ radial pulses, 2+ pedal pulses, normal cap refill Ext: no edema   Assessment: Encounter Diagnoses  Name Primary?   Psoriatic arthritis ([place]) Yes   Osteoarthritis, unspecified osteoarthritis type, unspecified site    ANA positive    Essential hypertension, benign    Anemia, unspecified type    Chronic bilateral thoracic back pain    Back spasm      Plan: Psoriatic arthritis, osteoarthritis, positive ANA-we discussed her concerns about medication safety, lab monitoring.  follow-up with rheumatology for discussion of the various medication options.  Chronic back pain, musculoskeletal pain, back spasm- I did prescribe some short-term medication she can use for breakthrough pain but I advise she   I strongly recommended she go to the [facility] and start water aerobics and do a daily stretching routine  Hypertension-currently on hydrochlorothiazide.  We discussed possibly adding an ARB but she is not quite to goal we discussed her concern about kidney safety.  Consider valsartan HCT  History of anemia-updated labs today   [name] was seen today for joint pain.  Diagnoses and all  orders for this visit:  Psoriatic arthritis ([place])  Osteoarthritis, unspecified osteoarthritis type, unspecified site  ANA positive  Essential hypertension, benign -     POCT Urinalysis DIP (Proadvantage Device) -     Microalbumin/Creatinine Ratio, Urine -     Comprehensive metabolic panel  Anemia, unspecified type -     CBC with Differential/Platelet  Chronic bilateral thoracic back pain  Back spasm  Other orders -     HYDROcodone-acetaminophen (NORCO) 5-325 MG tablet; Take 1 tablet by mouth 2 (two) times daily as needed.    Follow up: Pending labs

## 2023-02-02 NOTE — Progress Notes (Signed)
Results sent through MyChart

## 2023-02-05 LAB — MICROALBUMIN / CREATININE URINE RATIO
Creatinine, Urine: 58.3 mg/dL
Microalb/Creat Ratio: <5 mg/g creat (ref 0–29)
Microalbumin, Urine: <3 ug/mL

## 2023-02-05 LAB — COMPREHENSIVE METABOLIC PANEL
ALT: 15 IU/L (ref 0–32)
AST: 16 IU/L (ref 0–40)
Albumin/Globulin Ratio: 1.7 (ref 1.2–2.2)
Albumin: 4.5 g/dL (ref 3.9–4.9)
Alkaline Phosphatase: 67 IU/L (ref 44–121)
BUN/Creatinine Ratio: 18 (ref 12–28)
BUN: 18 mg/dL (ref 8–27)
Bilirubin Total: <0.2 mg/dL (ref 0.0–1.2)
CO2: 25 mmol/L (ref 20–29)
Calcium: 9.7 mg/dL (ref 8.7–10.3)
Chloride: 100 mmol/L (ref 96–106)
Creatinine, Ser: 0.99 mg/dL (ref 0.57–1.00)
Globulin, Total: 2.7 g/dL (ref 1.5–4.5)
Glucose: 88 mg/dL (ref 70–99)
Potassium: 4.1 mmol/L (ref 3.5–5.2)
Sodium: 140 mmol/L (ref 134–144)
Total Protein: 7.2 g/dL (ref 6.0–8.5)
eGFR: 62 mL/min/{1.73_m2} (ref >59)

## 2023-02-08 ENCOUNTER — Other Ambulatory Visit: Payer: Self-pay | Admitting: Medical

## 2023-02-08 MED ORDER — VALSARTAN-HYDROCHLOROTHIAZIDE 80-12.5 MG PO TABS: 1.0000 | ORAL_TABLET | Freq: Every day | ORAL | 0 refills | Status: DC

## 2023-02-08 NOTE — Progress Notes (Signed)
Results sent through MyChart

## 2023-02-12 ENCOUNTER — Other Ambulatory Visit: Payer: Self-pay | Admitting: Medical

## 2023-02-12 MED ORDER — WEGOVY 0.25 MG/0.5ML ~~LOC~~ SOAJ: 0.2500 mg | SUBCUTANEOUS | 0 refills | Status: DC

## 2023-02-12 MED ORDER — WEGOVY 0.5 MG/0.5ML ~~LOC~~ SOAJ: 0.5000 mg | SUBCUTANEOUS | 0 refills | Status: DC

## 2023-03-05 ENCOUNTER — Telehealth: Payer: Self-pay

## 2023-03-05 NOTE — Telephone Encounter (Signed)
PA for [facility] received and submitted. Waiting for results.

## 2023-03-09 MED ORDER — WEGOVY 0.25 MG/0.5ML ~~LOC~~ SOAJ: 0.2500 mg | SUBCUTANEOUS | 0 refills | Status: DC

## 2023-03-15 ENCOUNTER — Telehealth: Payer: Self-pay

## 2023-03-15 NOTE — Telephone Encounter (Signed)
[facility] PA denied through [facility].

## 2023-03-18 DIAGNOSIS — Z79899 Other long term (current) drug therapy: Secondary | ICD-10-CM | POA: Diagnosis not present

## 2023-03-18 DIAGNOSIS — L409 Psoriasis, unspecified: Secondary | ICD-10-CM | POA: Diagnosis not present

## 2023-03-18 DIAGNOSIS — M549 Dorsalgia, unspecified: Secondary | ICD-10-CM | POA: Diagnosis not present

## 2023-03-18 DIAGNOSIS — M79643 Pain in unspecified hand: Secondary | ICD-10-CM | POA: Diagnosis not present

## 2023-03-18 DIAGNOSIS — R768 Other specified abnormal immunological findings in serum: Secondary | ICD-10-CM | POA: Diagnosis not present

## 2023-03-18 DIAGNOSIS — M1990 Unspecified osteoarthritis, unspecified site: Secondary | ICD-10-CM | POA: Diagnosis not present

## 2023-03-18 DIAGNOSIS — L4050 Arthropathic psoriasis, unspecified: Secondary | ICD-10-CM | POA: Diagnosis not present

## 2023-03-19 DIAGNOSIS — Z4889 Encounter for other specified surgical aftercare: Secondary | ICD-10-CM | POA: Diagnosis not present

## 2023-03-27 DIAGNOSIS — M47816 Spondylosis without myelopathy or radiculopathy, lumbar region: Secondary | ICD-10-CM | POA: Diagnosis not present

## 2023-04-06 DIAGNOSIS — M5451 Vertebrogenic low back pain: Secondary | ICD-10-CM | POA: Diagnosis not present

## 2023-04-08 DIAGNOSIS — H16143 Punctate keratitis, bilateral: Secondary | ICD-10-CM | POA: Diagnosis not present

## 2023-04-08 DIAGNOSIS — T1511XA Foreign body in conjunctival sac, right eye, initial encounter: Secondary | ICD-10-CM | POA: Diagnosis not present

## 2023-04-13 DIAGNOSIS — M5451 Vertebrogenic low back pain: Secondary | ICD-10-CM | POA: Diagnosis not present

## 2023-04-14 DIAGNOSIS — M546 Pain in thoracic spine: Secondary | ICD-10-CM | POA: Diagnosis not present

## 2023-04-16 DIAGNOSIS — M5451 Vertebrogenic low back pain: Secondary | ICD-10-CM | POA: Diagnosis not present

## 2023-04-20 DIAGNOSIS — M5451 Vertebrogenic low back pain: Secondary | ICD-10-CM | POA: Diagnosis not present

## 2023-04-20 MED ORDER — WEGOVY 1 MG/0.5ML ~~LOC~~ SOAJ: 1.0000 mg | SUBCUTANEOUS | 1 refills | Status: DC

## 2023-04-22 DIAGNOSIS — M5451 Vertebrogenic low back pain: Secondary | ICD-10-CM | POA: Diagnosis not present

## 2023-04-23 ENCOUNTER — Ambulatory Visit (INDEPENDENT_AMBULATORY_CARE_PROVIDER_SITE_OTHER): Payer: Medicare Other | Admitting: Medical

## 2023-04-23 ENCOUNTER — Encounter: Payer: Self-pay | Admitting: Internal Medicine

## 2023-04-23 VITALS — BP 104/74 | HR 74 | Wt 215.3 lb

## 2023-04-23 DIAGNOSIS — J4530 Mild persistent asthma, uncomplicated: Secondary | ICD-10-CM

## 2023-04-23 DIAGNOSIS — L4050 Arthropathic psoriasis, unspecified: Secondary | ICD-10-CM | POA: Diagnosis not present

## 2023-04-23 DIAGNOSIS — I10 Essential (primary) hypertension: Secondary | ICD-10-CM

## 2023-04-23 DIAGNOSIS — I2510 Atherosclerotic heart disease of native coronary artery without angina pectoris: Secondary | ICD-10-CM

## 2023-04-23 DIAGNOSIS — M5136 Other intervertebral disc degeneration, lumbar region: Secondary | ICD-10-CM

## 2023-04-23 NOTE — Progress Notes (Signed)
Sent mychart message asking about referral

## 2023-04-23 NOTE — Progress Notes (Signed)
Left message for pt to call me back 

## 2023-04-23 NOTE — Progress Notes (Signed)
Subjective:  Paula Meyer is a 72 y.o. female who presents for Chief Complaint  Patient presents with   weight loss    Weight loss- having to pay out of pocket for this. Would possibly like to get something else that her insurance will pay for. Pt has taken qsymia before   Here for weight management follow up.   Is paying out of pocket for wegovy.  Has done 0.5mg  and getting ready to increase to 1mg  weekly dose.  Exercise - currently walking for exercise, generally 10-15 minutes daily.   Been using the coach per [facility] program.  Been doing some core strengthen exercise as well, physical therapy at [facility].  Diet - is careful with diet, eating healthy.  Has tried Qsymia prior as well.  No other aggravating or relieving factors.    No other c/o.  Past Medical History:  Diagnosis Date   Allergy    Anemia    Anxiety    Aortic atherosclerosis (HCC) 2022   thoracic   Arthritis    Asthma    CAD (coronary artery disease)    a. coronary calcium score was 2,498 (99th percentile for age and sex) in 07/2021; b. negative Myoview in 10/2021   Colitis    Depression    Heart murmur    Hx of colonic polyps    Hyperlipidemia    Hypertension    Osteoarthritis    Psoriatic arthritis (HCC)    Current Outpatient Medications on File Prior to Visit  Medication Sig Dispense Refill   acetaminophen (TYLENOL) 500 MG tablet Take 500 mg by mouth every 8 (eight) hours as needed for moderate pain.     albuterol (VENTOLIN HFA) 108 (90 Base) MCG/ACT inhaler INHALE 2 PUFFS INTO THE LUNGS EVERY 4 HOURS AS NEEDED FOR COUGH, WHEEZING OR SHORTNESS OF BREATH 8 g 4   Alirocumab (PRALUENT) 150 MG/ML SOAJ Inject 150 mg into the skin every 14 (fourteen) days. 2 mL 11   atorvastatin (LIPITOR) 20 MG tablet TAKE 1 TABLET BY MOUTH EVERY DAY 90 tablet 3   b complex vitamins capsule Take 1 capsule by mouth every other day.     Cholecalciferol (VITAMIN D) 50 MCG (2000 UT) tablet Take 4,000 Units by mouth daily.      diclofenac Sodium (VOLTAREN) 1 % GEL APPLY 2 GRAMS TO AFFECTED AREA 4 TIMES A DAY     Ferrous Sulfate (IRON PO) Take by mouth.     Glucose Blood (BLOOD GLUCOSE TEST STRIPS) STRP Test 1-2 times daily. Pt uses accu-chek guide me meter 100 strip 1   golimumab (SIMPONI ARIA) 50 MG/4ML SOLN injection Inject 50 mg into the vein. Every 3 months     HYDROcodone-acetaminophen (NORCO) 5-325 MG tablet Take 1 tablet by mouth 2 (two) times daily as needed. 20 tablet 0   magnesium oxide (MAG-OX) 400 (240 Mg) MG tablet Take 400 mg by mouth daily. 360mg      polyethylene glycol (MIRALAX / GLYCOLAX) 17 g packet Take 17 g by mouth daily. 14 each 0   Potassium 99 MG TABS Take 2 tablets by mouth daily.     valsartan-hydrochlorothiazide (DIOVAN HCT) 80-12.5 MG tablet Take 1 tablet by mouth daily. 90 tablet 0   Accu-Chek Softclix Lancets lancets Test 1-2 times daily 100 each 1   Semaglutide-Weight Management (WEGOVY) 1 MG/0.5ML SOAJ Inject 1 mg into the skin once a week. (Patient not taking: Reported on 04/23/2023) 2 mL 1   No current facility-administered medications on file  prior to visit.    The following portions of the patient's history were reviewed and updated as appropriate: allergies, current medications, past family history, past medical history, past social history, past surgical history and problem list.  ROS Otherwise as in subjective above    Objective: BP 104/74   Pulse 74   Wt 215 lb 4.8 oz (97.7 kg)   LMP 04/02/2011   BMI 36.10 kg/m   Wt Readings from Last 3 Encounters:  04/23/23 215 lb 4.8 oz (97.7 kg)  02/01/23 217 lb 9.6 oz (98.7 kg)  11/13/22 208 lb (94.3 kg)    General appearance: alert, no distress, well developed, well nourished Psych: Pleasant, good eye contact, answers questions appropriately   Assessment: Encounter Diagnoses  Name Primary?   Essential hypertension, benign Yes   Coronary artery disease involving native coronary artery of native heart without angina  pectoris    Mild persistent asthma, unspecified whether complicated    DDD (degenerative disc disease), lumbar    Psoriatic arthritis (HCC)      Plan: Here today to discuss weight management.  She has been paying out-of-pocket for [facility] to help with weight loss at over $800 per month.  So far she has not lost weight on the starter dose or the 0.5 mg dose.  She is getting ready go up to 1 mg dose.  We discussed ways and strategies to lose weight.  At this point I strongly recommend she seek out a fitness trainer to help with diet and exercise strategies.  She may still want to use Wegovy at this time which is okay but that is up to her.  It is quite an expensive option at this point.  Offered referral to dietitian but she declines at this time  Offered referral to healthy weight and wellness clinic as well.  She will consider  Increase exercise as discussed.  Continue to work on healthy diet measures   Leira was seen today for weight loss.  Diagnoses and all orders for this visit:  Essential hypertension, benign  Coronary artery disease involving native coronary artery of native heart without angina pectoris  Mild persistent asthma, unspecified whether complicated  DDD (degenerative disc disease), lumbar  Psoriatic arthritis (HCC)    Follow up: As needed

## 2023-04-23 NOTE — Patient Instructions (Addendum)
Check prices on Wegovy 1mg  and 2mg  as well as Mounjaro 5mg  and 7mg  or Zepbound 5mg  and 7mg .  Check Good Rx app on the phone   I strongly recommend you work with an Event organiser for the next few months to help with diet, exercise and weight loss plan.    [facility] Address: [address] [place], [place] [address] Hours:  Open ? Closes 7:30?PM Phone: [phone]   [facility] Address: [address] [place], [place] [address] Hours:  Closed ? Opens 7:30?AM Sat Phone: [phone]   [facility] Address: [address] [place], [place], [place] [address] Hours:  Open ? Closes 7:30?PM Phone: [phone]   Consider referral to dietician.  I'd be happy to refer to dietician.

## 2023-04-27 DIAGNOSIS — M545 Low back pain, unspecified: Secondary | ICD-10-CM | POA: Diagnosis not present

## 2023-04-27 NOTE — Telephone Encounter (Signed)
Pt will let about this and call me back

## 2023-04-28 DIAGNOSIS — L4050 Arthropathic psoriasis, unspecified: Secondary | ICD-10-CM | POA: Diagnosis not present

## 2023-04-29 DIAGNOSIS — M546 Pain in thoracic spine: Secondary | ICD-10-CM | POA: Diagnosis not present

## 2023-05-07 DIAGNOSIS — M5451 Vertebrogenic low back pain: Secondary | ICD-10-CM | POA: Diagnosis not present

## 2023-05-07 DIAGNOSIS — M79605 Pain in left leg: Secondary | ICD-10-CM | POA: Diagnosis not present

## 2023-05-13 DIAGNOSIS — M25462 Effusion, left knee: Secondary | ICD-10-CM | POA: Diagnosis not present

## 2023-05-13 DIAGNOSIS — M5459 Other low back pain: Secondary | ICD-10-CM | POA: Diagnosis not present

## 2023-05-13 DIAGNOSIS — M25562 Pain in left knee: Secondary | ICD-10-CM | POA: Diagnosis not present

## 2023-05-20 ENCOUNTER — Other Ambulatory Visit (INDEPENDENT_AMBULATORY_CARE_PROVIDER_SITE_OTHER): Payer: Medicare Other

## 2023-05-20 ENCOUNTER — Encounter: Payer: Self-pay | Admitting: Physician Assistant

## 2023-05-20 ENCOUNTER — Ambulatory Visit (INDEPENDENT_AMBULATORY_CARE_PROVIDER_SITE_OTHER): Payer: Medicare Other | Admitting: Physician Assistant

## 2023-05-20 DIAGNOSIS — M25562 Pain in left knee: Secondary | ICD-10-CM

## 2023-05-20 MED ORDER — MELOXICAM 7.5 MG PO TABS: 7.5000 mg | ORAL_TABLET | Freq: Every day | ORAL | 0 refills | Status: DC

## 2023-05-20 MED ORDER — LIDOCAINE HCL 1 % IJ SOLN
2.0000 mL | INTRAMUSCULAR | Status: AC | PRN
  Administered 2023-05-20: 2 mL

## 2023-05-20 MED ORDER — BUPIVACAINE HCL 0.25 % IJ SOLN
2.0000 mL | INTRAMUSCULAR | Status: AC | PRN
  Administered 2023-05-20: 2 mL via INTRA_ARTICULAR

## 2023-05-20 MED ORDER — METHYLPREDNISOLONE ACETATE 40 MG/ML IJ SUSP
60.0000 mg | INTRAMUSCULAR | Status: AC | PRN
  Administered 2023-05-20: 60 mg via INTRA_ARTICULAR

## 2023-05-20 NOTE — Progress Notes (Signed)
Office Visit Note   Patient: Paula Meyer           Date of Birth: 1951-04-12           MRN: [id number] Visit Date: 05/20/2023              Requested by: Jac Canavan, PA-C [address] [place],  [place] [address] PCP: Jac Canavan, PA-C   Assessment & Plan: Visit Diagnoses:  1. Acute pain of left knee     Plan: Patient is a pleasant 72 year old woman with a chief complaint of left knee pain.  Denies any injury.  She said she has been doing physical therapy for her low back.  She thinks she may have aggravated this when she was working on a machine with her knees.  She denies any fever or chills.  She said she recently started dandelion tea which seems to help with some of the swelling.  She also has been taking Aleve arthritis not sure if it has been helpful.  Findings consistent with some early arthritis.  Discussed the natural history of this.  She would like to try an injection today and will follow-up with me in 3 weeks.  I have also placed her on a course of meloxicam.  She is not to take this with other anti-inflammatories.  Follow-Up Instructions: Return in about 3 weeks (around 06/10/2023).   Orders:  Orders Placed This Encounter  Procedures   XR Knee 1-2 Views Left   Meds ordered this encounter  Medications   meloxicam (MOBIC) 7.5 MG tablet    Sig: Take 1 tablet (7.5 mg total) by mouth daily.    Dispense:  30 tablet    Refill:  0      Procedures: Large Joint Inj: L knee on 05/20/2023 11:23 AM Indications: pain and diagnostic evaluation Details: 25 G 1.5 in needle, anteromedial approach  Arthrogram: No  Medications: 60 mg methylPREDNISolone acetate 40 MG/ML; 2 mL lidocaine 1 %; 2 mL bupivacaine 0.25 % Outcome: tolerated well, no immediate complications Procedure, treatment alternatives, risks and benefits explained, specific risks discussed. Consent was given by the patient.     Clinical Data: No additional findings.   Subjective: No chief  complaint on file.   HPI patient is a 72 year old woman with a chief complaint of left knee pain.  Denies any injuries.  Denies any instability.  She does feel like she has swelling and some swelling in the back.  She has been working with physical therapy for back exercises by another provider.  She was told she had a tight IT band.  She did take a prednisone dose Dosepak which relieved all of her symptoms except those within her knee  Review of Systems  All other systems reviewed and are negative.    Objective: Vital Signs: LMP 04/02/2011   Physical Exam Constitutional:      Appearance: Normal appearance.  Pulmonary:     Effort: Pulmonary effort is normal.  Skin:    General: Skin is warm and dry.  Neurological:     Mental Status: She is alert.     Ortho Exam Left knee no effusion no erythema compartments are soft and compressible.  She has a negative Homans' sign.  She is neurovascularly intact.  She has good strength with extending and flexing her knee.  She does have some pain over the lateral joint line Specialty Comments:  No specialty comments available.  Imaging: XR Knee 1-2 Views Left  Result  Date: 05/20/2023 Radiographs of her left knee were reviewed today today.  Slight varus alignment.  No obvious fractures.  She does have sclerotic changes in the medial joint and most notably in the patellofemoral joint consistent with early degenerative changes    PMFS History: Patient Active Problem List   Diagnosis Date Noted   Psoriatic arthritis (HCC) 02/01/2023   ANA positive 02/01/2023   Anemia 02/01/2023   Chronic bilateral thoracic back pain 02/01/2023   Back spasm 02/01/2023   Bacterial URI 12/18/2022   CAD (coronary artery disease)    Impaired fasting blood sugar 05/12/2022   Itchy eyes 05/12/2022   Other fatigue 05/12/2022   Spinal stenosis at L4-L5 level 04/09/2022   Arthropathic psoriasis (HCC) 03/05/2022   Dependency on pain medication (HCC) 11/05/2021    Aortic atherosclerosis (HCC) 11/05/2021   Breast calcifications 11/05/2021   BMI 34.0-34.9,adult 10/03/2021   Decreased pedal pulses 08/19/2021   Post-menopause 08/19/2021   Estrogen deficiency 08/19/2021   Degenerative disc disease, cervical 11/02/2018   Vitamin D deficiency disease 08/23/2018   Morbid obesity (HCC) 03/17/2018   Seasonal affective disorder (HCC) 11/03/2017   GAD (generalized anxiety disorder) 11/03/2017   B12 deficiency anemia 08/24/2012   GERD (gastroesophageal reflux disease) 06/30/2012   Eczema 05/04/2012   DDD (degenerative disc disease), lumbar 04/02/2011   Depression with anxiety 10/20/2010   Essential hypertension, benign 10/20/2010   Perennial and seasonal allergic rhinitis with a probable nonallergic component 10/20/2010   Mild persistent asthma 10/20/2010   Osteoarthritis 10/20/2010   Past Medical History:  Diagnosis Date   Allergy    Anemia    Anxiety    Aortic atherosclerosis (HCC) 2022   thoracic   Arthritis    Asthma    CAD (coronary artery disease)    a. coronary calcium score was 2,498 (99th percentile for age and sex) in 07/2021; b. negative Myoview in 10/2021   Colitis    Depression    Heart murmur    Hx of colonic polyps    Hyperlipidemia    Hypertension    Osteoarthritis    Psoriatic arthritis (HCC)     Family History  Problem Relation Age of Onset   Heart attack Brother    Hyperlipidemia Brother    Hypertension Brother    Heart disease Brother    Arthritis Other    Depression Other    Colon cancer Neg Hx    Stomach cancer Neg Hx     Past Surgical History:  Procedure Laterality Date   COLONOSCOPY  2018   LUMBAR LAMINECTOMY/DECOMPRESSION MICRODISCECTOMY N/A 04/09/2022   Procedure: Microlumbar decompression Lumbar five -Sacral one right;  Surgeon: Jene Every, MD;  Location: [facility] OR;  Service: Orthopedics;  Laterality: N/A;   TUBAL LIGATION     WISDOM TOOTH EXTRACTION     Social History   Occupational History   Not on  file  Tobacco Use   Smoking status: Former    Years: 4    Types: Cigarettes    Quit date: 1989    Years since quitting: 35.4    Passive exposure: Never   Smokeless tobacco: Never  Vaping Use   Vaping Use: Never used  Substance and Sexual Activity   Alcohol use: Not Currently    Alcohol/week: 3.0 standard drinks of alcohol    Types: 3 Glasses of wine per week    Comment: rarely   Drug use: No   Sexual activity: Not Currently

## 2023-05-26 DIAGNOSIS — L4050 Arthropathic psoriasis, unspecified: Secondary | ICD-10-CM | POA: Diagnosis not present

## 2023-05-30 ENCOUNTER — Other Ambulatory Visit: Payer: Self-pay | Admitting: Medical

## 2023-06-11 ENCOUNTER — Ambulatory Visit (INDEPENDENT_AMBULATORY_CARE_PROVIDER_SITE_OTHER): Payer: Medicare Other | Admitting: Physician Assistant

## 2023-06-11 ENCOUNTER — Encounter: Payer: Self-pay | Admitting: Physician Assistant

## 2023-06-11 DIAGNOSIS — M1712 Unilateral primary osteoarthritis, left knee: Secondary | ICD-10-CM

## 2023-06-11 NOTE — Progress Notes (Signed)
Office Visit Note   Patient: Paula Meyer           Date of Birth: 01-07-1951           MRN: [id number] Visit Date: 06/11/2023              Requested by: Jac Canavan, PA-C [address] [place],  [place] [address] PCP: Jac Canavan, PA-C  Chief Complaint  Patient presents with   Left Knee - Pain      HPI: Patient presents today to follow-up on her left knee injection.  She was given a steroid injection 3 weeks ago into her left knee.  She has had significant relief in her symptoms.  She is still taking meloxicam but when she needs it.  She still gets a little soreness on the outside of her knee.  Often has some aching when she gets up but has not noticed improvement  Assessment & Plan: Visit Diagnoses: Left knee pain  Plan: Patient will just take the meloxicam when she needs it can continue with topical medications as needed.  We briefly talked about gel injections and reinjecting her knee with some steroid if she needed it.  We also talked about strengthening and close chain exercises.  She does say that this all began while she was doing some physical therapy for her back in 1 particular movement flared up her left knee.  She is also restarted infusions through rheumatology that she had been getting but then had to discontinue because she had back surgery.  She thinks all of this may be helping as well.  I offered her more therapy on her back and/or knee and she said she is going to see how the infusions continue to be helpful.  But will contact us if she like to pursue it  Follow-Up Instructions: No follow-ups on file.   Ortho Exam  Patient is alert, oriented, no adenopathy, well-dressed, normal affect, normal respiratory effort. Left knee no effusion no erythema compartments are soft and nontender mild soft tissue swelling mild lateral joint line pain.  Good stability neurovascular intact compartments are soft  Imaging: No results found. No images are attached  to the encounter.  Labs: Lab Results  Component Value Date   HGBA1C 5.6 05/12/2022   HGBA1C 5.8 (H) 10/03/2021   HGBA1C 5.7 06/27/2018   ESRSEDRATE 31 (H) 02/13/2016   CRP 0.2 (L) 11/09/2016   LABORGA NO GROWTH 11/09/2016     Lab Results  Component Value Date   ALBUMIN 4.5 02/01/2023   ALBUMIN 4.3 07/01/2022   ALBUMIN 4.3 10/03/2021    Lab Results  Component Value Date   MG 2.1 08/10/2022   MG 1.9 02/27/2021   MG 2.1 02/13/2016   Lab Results  Component Value Date   VD25OH 49.6 08/10/2022   VD25OH 32.9 02/27/2021   VD25OH 35.9 06/21/2020    No results found for: "PREALBUMIN"    Latest Ref Rng & Units 02/01/2023    4:29 PM 04/01/2022    9:55 AM 02/27/2021   11:00 AM  CBC EXTENDED  WBC 3.4 - 10.8 x10E3/uL 4.7  3.5  3.2   RBC 3.77 - 5.28 x10E6/uL 4.35  4.09  4.15   Hemoglobin 11.1 - 15.9 g/dL 19.1  47.8  29.5   HCT 34.0 - 46.6 % 36.3  35.4  33.8   Platelets 150 - 450 x10E3/uL 211  186  178   NEUT# 1.4 - 7.0 x10E3/uL 2.1   1.2  Lymph# 0.7 - 3.1 x10E3/uL 2.0   1.6      There is no height or weight on file to calculate BMI.  Orders:  No orders of the defined types were placed in this encounter.  No orders of the defined types were placed in this encounter.    Procedures: No procedures performed  Clinical Data: No additional findings.  ROS:  All other systems negative, except as noted in the HPI. Review of Systems  Objective: Vital Signs: LMP 04/02/2011   Specialty Comments:  No specialty comments available.  PMFS History: Patient Active Problem List   Diagnosis Date Noted   Psoriatic arthritis (HCC) 02/01/2023   ANA positive 02/01/2023   Anemia 02/01/2023   Chronic bilateral thoracic back pain 02/01/2023   Back spasm 02/01/2023   Bacterial URI 12/18/2022   CAD (coronary artery disease)    Impaired fasting blood sugar 05/12/2022   Itchy eyes 05/12/2022   Other fatigue 05/12/2022   Spinal stenosis at L4-L5 level 04/09/2022   Arthropathic  psoriasis (HCC) 03/05/2022   Dependency on pain medication (HCC) 11/05/2021   Aortic atherosclerosis (HCC) 11/05/2021   Breast calcifications 11/05/2021   BMI 34.0-34.9,adult 10/03/2021   Decreased pedal pulses 08/19/2021   Post-menopause 08/19/2021   Estrogen deficiency 08/19/2021   Degenerative disc disease, cervical 11/02/2018   Vitamin D deficiency disease 08/23/2018   Morbid obesity (HCC) 03/17/2018   Seasonal affective disorder (HCC) 11/03/2017   GAD (generalized anxiety disorder) 11/03/2017   B12 deficiency anemia 08/24/2012   GERD (gastroesophageal reflux disease) 06/30/2012   Eczema 05/04/2012   DDD (degenerative disc disease), lumbar 04/02/2011   Depression with anxiety 10/20/2010   Essential hypertension, benign 10/20/2010   Perennial and seasonal allergic rhinitis with a probable nonallergic component 10/20/2010   Mild persistent asthma 10/20/2010   Osteoarthritis 10/20/2010   Past Medical History:  Diagnosis Date   Allergy    Anemia    Anxiety    Aortic atherosclerosis (HCC) 2022   thoracic   Arthritis    Asthma    CAD (coronary artery disease)    a. coronary calcium score was 2,498 (99th percentile for age and sex) in 07/2021; b. negative Myoview in 10/2021   Colitis    Depression    Heart murmur    Hx of colonic polyps    Hyperlipidemia    Hypertension    Osteoarthritis    Psoriatic arthritis (HCC)     Family History  Problem Relation Age of Onset   Heart attack Brother    Hyperlipidemia Brother    Hypertension Brother    Heart disease Brother    Arthritis Other    Depression Other    Colon cancer Neg Hx    Stomach cancer Neg Hx     Past Surgical History:  Procedure Laterality Date   COLONOSCOPY  2018   LUMBAR LAMINECTOMY/DECOMPRESSION MICRODISCECTOMY N/A 04/09/2022   Procedure: Microlumbar decompression Lumbar five -Sacral one right;  Surgeon: Jene Every, MD;  Location: [facility] OR;  Service: Orthopedics;  Laterality: N/A;   TUBAL LIGATION      WISDOM TOOTH EXTRACTION     Social History   Occupational History   Not on file  Tobacco Use   Smoking status: Former    Years: 4    Types: Cigarettes    Quit date: 1989    Years since quitting: 35.5    Passive exposure: Never   Smokeless tobacco: Never  Vaping Use   Vaping Use: Never used  Substance and  Sexual Activity   Alcohol use: Not Currently    Alcohol/week: 3.0 standard drinks of alcohol    Types: 3 Glasses of wine per week    Comment: rarely   Drug use: No   Sexual activity: Not Currently

## 2023-06-16 ENCOUNTER — Other Ambulatory Visit: Payer: Self-pay | Admitting: Physician Assistant

## 2023-06-24 DIAGNOSIS — M1990 Unspecified osteoarthritis, unspecified site: Secondary | ICD-10-CM | POA: Diagnosis not present

## 2023-06-24 DIAGNOSIS — M79643 Pain in unspecified hand: Secondary | ICD-10-CM | POA: Diagnosis not present

## 2023-06-24 DIAGNOSIS — Z79899 Other long term (current) drug therapy: Secondary | ICD-10-CM | POA: Diagnosis not present

## 2023-06-24 DIAGNOSIS — M549 Dorsalgia, unspecified: Secondary | ICD-10-CM | POA: Diagnosis not present

## 2023-06-24 DIAGNOSIS — L4050 Arthropathic psoriasis, unspecified: Secondary | ICD-10-CM | POA: Diagnosis not present

## 2023-06-24 DIAGNOSIS — L409 Psoriasis, unspecified: Secondary | ICD-10-CM | POA: Diagnosis not present

## 2023-06-24 DIAGNOSIS — R768 Other specified abnormal immunological findings in serum: Secondary | ICD-10-CM | POA: Diagnosis not present

## 2023-06-24 DIAGNOSIS — M25562 Pain in left knee: Secondary | ICD-10-CM | POA: Diagnosis not present

## 2023-07-03 ENCOUNTER — Other Ambulatory Visit: Payer: Self-pay | Admitting: Medical

## 2023-07-12 ENCOUNTER — Ambulatory Visit (INDEPENDENT_AMBULATORY_CARE_PROVIDER_SITE_OTHER): Payer: Medicare Other | Admitting: Medical

## 2023-07-12 VITALS — BP 132/80 | HR 64 | Wt 211.2 lb

## 2023-07-12 DIAGNOSIS — T50905A Adverse effect of unspecified drugs, medicaments and biological substances, initial encounter: Secondary | ICD-10-CM

## 2023-07-12 DIAGNOSIS — I10 Essential (primary) hypertension: Secondary | ICD-10-CM

## 2023-07-12 MED ORDER — AMLODIPINE BESYLATE 10 MG PO TABS: 10.0000 mg | ORAL_TABLET | Freq: Every day | ORAL | 1 refills | Status: DC

## 2023-07-12 NOTE — Progress Notes (Signed)
Subjective:  Paula Meyer is a 72 y.o. female who presents for Chief Complaint  Patient presents with   Medical Management of Chronic Issues    BP is high. Bp medication making her sleepy     Here for concerns about sleepiness with Valsartan HCT.  She notes feeling sleepy on her current blood pressure pill valsartan HCT.  She was on tizanidine muscle laxer really recently per orthopedics for knee inflammation and spasm but that was after she already felt she was getting sleepy from valsartan HCT.  She has not used valsartan HCT for several days.  She last used tizanidine over a week ago.  She is currently on prednisone per rheumatology.  Sees Dr. Deanne Meyer.  Just recently stared back on injection for arthritis  Using total gym and getting some exercise.  Been seeing orthopedics and rheumatology about joint pains, knee pains, shoulder pain.     No other aggravating or relieving factors.    No other c/o.  Past Medical History:  Diagnosis Date   Allergy    Anemia    Anxiety    Aortic atherosclerosis (HCC) 2022   thoracic   Arthritis    Asthma    CAD (coronary artery disease)    a. coronary calcium score was 2,498 (99th percentile for age and sex) in 07/2021; b. negative Myoview in 10/2021   Colitis    Depression    Heart murmur    Hx of colonic polyps    Hyperlipidemia    Hypertension    Osteoarthritis    Psoriatic arthritis (HCC)    Current Outpatient Medications on File Prior to Visit  Medication Sig Dispense Refill   acetaminophen (TYLENOL) 500 MG tablet Take 500 mg by mouth every 8 (eight) hours as needed for moderate pain.     albuterol (VENTOLIN HFA) 108 (90 Base) MCG/ACT inhaler INHALE 2 PUFFS INTO THE LUNGS EVERY 4 HOURS AS NEEDED FOR COUGH, WHEEZING OR SHORTNESS OF BREATH 8 g 4   Alirocumab (PRALUENT) 150 MG/ML SOAJ Inject 150 mg into the skin every 14 (fourteen) days. 2 mL 11   atorvastatin (LIPITOR) 20 MG tablet TAKE 1 TABLET BY MOUTH EVERY DAY 90 tablet 3   b  complex vitamins capsule Take 1 capsule by mouth every other day.     Cholecalciferol (VITAMIN D) 50 MCG (2000 UT) tablet Take 4,000 Units by mouth daily.     diclofenac Sodium (VOLTAREN) 1 % GEL APPLY 2 GRAMS TO AFFECTED AREA 4 TIMES A DAY     golimumab (SIMPONI ARIA) 50 MG/4ML SOLN injection Inject 50 mg into the vein. Every 3 months     magnesium oxide (MAG-OX) 400 (240 Mg) MG tablet Take 400 mg by mouth daily. 360mg      Potassium 99 MG TABS Take 2 tablets by mouth daily.     Accu-Chek Softclix Lancets lancets Test 1-2 times daily 100 each 1   Ferrous Sulfate (IRON PO) Take by mouth. (Patient not taking: Reported on 07/12/2023)     Glucose Blood (BLOOD GLUCOSE TEST STRIPS) STRP Test 1-2 times daily. Pt uses accu-chek guide me meter 100 strip 1   valsartan-hydrochlorothiazide (DIOVAN-HCT) 80-12.5 MG tablet TAKE 1 TABLET BY MOUTH EVERY DAY (Patient not taking: Reported on 07/12/2023) 90 tablet 0   No current facility-administered medications on file prior to visit.   The following portions of the patient's history were reviewed and updated as appropriate: allergies, current medications, past family history, past medical history, past social history, past surgical  history and problem list.  ROS Otherwise as in subjective above    Objective: BP 132/80   Pulse 64   Wt 211 lb 3.2 oz (95.8 kg)   LMP 04/02/2011   BMI 35.42 kg/m   Wt Readings from Last 3 Encounters:  07/12/23 211 lb 3.2 oz (95.8 kg)  04/23/23 215 lb 4.8 oz (97.7 kg)  02/01/23 217 lb 9.6 oz (98.7 kg)   BP Readings from Last 3 Encounters:  07/12/23 132/80  04/23/23 104/74  02/01/23 138/70   General appearance: alert, no distress, well developed, well nourished Heart: RRR, normal S1, S2, no murmurs Lungs: CTA bilaterally, no wheezes, rhonchi, or rales Pulses: 2+ radial pulses, 2+ pedal pulses, normal cap refill Ext: no edema   Assessment: Encounter Diagnoses  Name Primary?   Adverse effect of drug, initial  encounter Yes   Essential hypertension, benign      Plan: Looking back in her chart record, she has prior used plain amlodipine 5 mg daily, plain hydrochlorothiazide, nebivolol, olmesartan HCT, telmisartan HCT.  I advised her I really do not think her valsartan HCT is causing her to be sleepy.  Nevertheless she wants to try something different.  Begin back on trial of amlodipine.  We discussed risk and benefits of medication, proper use of medication.  Advise she monitor her blood pressure and watch for symptoms.  Recheck within the next month  Reiterated that muscle relaxer medication are going to cause sedation, but not convinced her BP medication is the culprit here.  Paula Meyer was seen today for medical management of chronic issues.  Diagnoses and all orders for this visit:  Adverse effect of drug, initial encounter  Essential hypertension, benign  Other orders -     amLODipine (NORVASC) 10 MG tablet; Take 1 tablet (10 mg total) by mouth daily.    Follow up: 59mo

## 2023-07-15 NOTE — Progress Notes (Deleted)
Cardiology Office Note:    Date:  07/15/2023   ID:  Paula Meyer, DOB 1951-11-12, MRN [id number]  PCP:  Jac Canavan, PA-C  Cardiologist:  Reatha Harps, MD  Electrophysiologist:  None   Referring MD: Jac Canavan, PA-C   Chief Complaint: routine follow-up of CAD  History of Present Illness:    Paula Meyer is a 72 y.o. female with a history of CAD with an elevated coronary calcium score in 07/2021 but negative Myoview in 10/2021, hypertension, hyperlipidemia, psoriatic arthritis, and anxiety/depression who is followed by Dr. Flora Lipps and presents today for follow-up of CAD.   Patient was referred to Dr. Flora Lipps in 09/2021 for further evaluation of an elevated coronary calcium score. PCP ordered a coronary calcium score in 07/2021 which came back elevated at 2,498 placing her at the 99th percentile for age and sex. At that initial visit with Dr. Flora Lipps, patient denied any chest pain or shortness of breath but did report have severely elevated cholesterol her entire life as well as a family history of CAD. She was previously on a statin but this had to be stopped due to elevated LFTs and she had recently been started on Praluent. EKG in the office showed normal sinus rhythm with T wave inversions in inferior and anterior leads but patient reported she had had these T wave changes for years. Myoview and Echo were ordered for further evaluation.  Myoview was low risk with no evidence of ischemia and Echo LVEF of 60-65% with normal wall motion and diastolic parameters.  Patient was last seen by me in 09/2022 at which time she reported recent palpitations after drinking Celsius and starting a intermittent fasting supplement that contained 150mg  of caffeine per serving. Palpitations resolved after she stopped both of these so they were felt to be due to high caffeine intake. She also described 2 episodes of very atypical chest pain that was not felt to be cardiac in nature. No additional  work-up was recommended.   Patient presents today for follow-up. ***  CAD Coronary calcium score in 07/2021 was elevated at 2,498 placing her at the 99th percentile for age and sex. Myoview in 10/2021 was low risk with no evidence of ischemia. - *** - Continue aspirin and statin/Repatha.   Hypertension BP well controlled. - Continue HCTZ 25mg  daily.   Hyperlipidemia Most recent lipid panel in 06/2022: Total Cholesterol 175, Triglycerides 114, HDL 59, LDL 96. LDL goal <70 given CAD. - Continue Repatha and Lipitor 20mg  daily.  - LDL was previously at goal and went back up. Patient states this was due to a poor diet. Recommended diet modification.  - Followed by PCP.    Obesity ***  EKGs/Labs/Other Studies Reviewed:    The following studies were reviewed:  CT Cardiac Scoring 07/30/2021: Impressions: Total Agatston score: 2,498 [place] database percentile: 99 Partially calcified nodule in the left breast which cannot be characterized by CT. Recommend correlation with mammography. Aortic atherosclerosis. _______________   Echocardiogram 10/21/2021: Impressions:  1. Left ventricular ejection fraction, by estimation, is 60 to 65%. The  left ventricle has normal function. The left ventricle has no regional  wall motion abnormalities. Left ventricular diastolic parameters were  normal.   2. Right ventricular systolic function is normal. The right ventricular  size is normal. Tricuspid regurgitation signal is inadequate for assessing  PA pressure.   3. The mitral valve is normal in structure. No evidence of mitral valve  regurgitation. No evidence of mitral stenosis.  4. The aortic valve is tricuspid. Aortic valve regurgitation is not  visualized. Mild aortic valve sclerosis is present, with no evidence of  aortic valve stenosis.   5. The inferior vena cava is normal in size with greater than 50%  respiratory variability, suggesting right atrial pressure of 3 mmHg.    Comparison(s): Prior images unable to be directly viewed, comparison made  by report only.  _______________   Myoview 10/21/2021:   The study is normal. The study is low risk.   No ST deviation was noted.   LV perfusion is normal. There is no evidence of ischemia. There is no evidence of infarction.   Left ventricular function is normal. Nuclear stress EF: 67 %. The left ventricular ejection fraction is hyperdynamic (>65%). End diastolic cavity size is normal. End systolic cavity size is normal.   Prior study not available for comparison.   Negative Lexiscan myoview. CT Cardiac Scoring 07/30/2021: Impressions: Total Agatston score: 2,498 [place] database percentile: 99 Partially calcified nodule in the left breast which cannot be characterized by CT. Recommend correlation with mammography. Aortic atherosclerosis. _______________   Echocardiogram 10/21/2021: Impressions:  1. Left ventricular ejection fraction, by estimation, is 60 to 65%. The  left ventricle has normal function. The left ventricle has no regional  wall motion abnormalities. Left ventricular diastolic parameters were  normal.   2. Right ventricular systolic function is normal. The right ventricular  size is normal. Tricuspid regurgitation signal is inadequate for assessing  PA pressure.   3. The mitral valve is normal in structure. No evidence of mitral valve  regurgitation. No evidence of mitral stenosis.   4. The aortic valve is tricuspid. Aortic valve regurgitation is not  visualized. Mild aortic valve sclerosis is present, with no evidence of  aortic valve stenosis.   5. The inferior vena cava is normal in size with greater than 50%  respiratory variability, suggesting right atrial pressure of 3 mmHg.   Comparison(s): Prior images unable to be directly viewed, comparison made  by report only.  _______________   Myoview 10/21/2021:   The study is normal. The study is low risk.   No ST deviation was noted.    LV perfusion is normal. There is no evidence of ischemia. There is no evidence of infarction.   Left ventricular function is normal. Nuclear stress EF: 67 %. The left ventricular ejection fraction is hyperdynamic (>65%). End diastolic cavity size is normal. End systolic cavity size is normal.   Prior study not available for comparison.   Negative Lexiscan myoview.   EKG:  EKG ordered today. EKG personally reviewed and demonstrates ***.  Recent Labs: 08/10/2022: Magnesium 2.1 02/01/2023: ALT 15; BUN 18; Creatinine, Ser 0.99; Hemoglobin 12.1; Platelets 211; Potassium 4.1; Sodium 140  Recent Lipid Panel    Component Value Date/Time   CHOL 175 07/01/2022 1207   TRIG 114 07/01/2022 1207   HDL 59 07/01/2022 1207   CHOLHDL 3.0 07/01/2022 1207   CHOLHDL 6 08/23/2018 1651   VLDL 29.8 08/23/2018 1651   LDLCALC 96 07/01/2022 1207   LDLDIRECT 188.6 07/10/2013 1626    Physical Exam:    Vital Signs: LMP 04/02/2011     Wt Readings from Last 3 Encounters:  07/12/23 211 lb 3.2 oz (95.8 kg)  04/23/23 215 lb 4.8 oz (97.7 kg)  02/01/23 217 lb 9.6 oz (98.7 kg)     General: 72 y.o. female in no acute distress. HEENT: Normocephalic and atraumatic. Sclera clear. EOMs intact. Neck: Supple. No carotid bruits.  No JVD. Heart: *** RRR. Distinct S1 and S2. No murmurs, gallops, or rubs. Radial and distal pedal pulses 2+ and equal bilaterally. Lungs: No increased work of breathing. Clear to ausculation bilaterally. No wheezes, rhonchi, or rales.  Abdomen: Soft, non-distended, and non-tender to palpation. Bowel sounds present in all 4 quadrants.  MSK: Normal strength and tone for age. *** Extremities: No lower extremity edema.    Skin: Warm and dry. Neuro: Alert and oriented x3. No focal deficits. Psych: Normal affect. Responds appropriately.   Assessment:    No diagnosis found.  Plan:     Disposition: Follow up in ***   Medication Adjustments/Labs and Tests Ordered: Current medicines are  reviewed at length with the patient today.  Concerns regarding medicines are outlined above.  No orders of the defined types were placed in this encounter.  No orders of the defined types were placed in this encounter.   There are no Patient Instructions on file for this visit.   Signed, Corrin Parker, PA-C  07/15/2023 4:03 PM    [facility]

## 2023-07-19 ENCOUNTER — Ambulatory Visit: Payer: Medicare Other | Admitting: Student

## 2023-07-19 DIAGNOSIS — I2510 Atherosclerotic heart disease of native coronary artery without angina pectoris: Secondary | ICD-10-CM

## 2023-07-21 ENCOUNTER — Other Ambulatory Visit: Payer: Self-pay | Admitting: Physician Assistant

## 2023-07-22 DIAGNOSIS — L4050 Arthropathic psoriasis, unspecified: Secondary | ICD-10-CM | POA: Diagnosis not present

## 2023-08-19 DIAGNOSIS — L4050 Arthropathic psoriasis, unspecified: Secondary | ICD-10-CM | POA: Diagnosis not present

## 2023-08-31 ENCOUNTER — Other Ambulatory Visit: Payer: Self-pay | Admitting: Physician Assistant

## 2023-09-11 ENCOUNTER — Other Ambulatory Visit: Payer: Self-pay | Admitting: Medical

## 2023-09-14 DIAGNOSIS — M25562 Pain in left knee: Secondary | ICD-10-CM | POA: Diagnosis not present

## 2023-09-14 DIAGNOSIS — M25511 Pain in right shoulder: Secondary | ICD-10-CM | POA: Diagnosis not present

## 2023-09-14 DIAGNOSIS — M5459 Other low back pain: Secondary | ICD-10-CM | POA: Diagnosis not present

## 2023-09-16 DIAGNOSIS — L4050 Arthropathic psoriasis, unspecified: Secondary | ICD-10-CM | POA: Diagnosis not present

## 2023-09-16 DIAGNOSIS — M25511 Pain in right shoulder: Secondary | ICD-10-CM | POA: Diagnosis not present

## 2023-09-27 DIAGNOSIS — M25511 Pain in right shoulder: Secondary | ICD-10-CM | POA: Diagnosis not present

## 2023-09-30 DIAGNOSIS — M25511 Pain in right shoulder: Secondary | ICD-10-CM | POA: Diagnosis not present

## 2023-10-04 DIAGNOSIS — M25511 Pain in right shoulder: Secondary | ICD-10-CM | POA: Diagnosis not present

## 2023-10-07 DIAGNOSIS — M25511 Pain in right shoulder: Secondary | ICD-10-CM | POA: Diagnosis not present

## 2023-10-11 DIAGNOSIS — M25511 Pain in right shoulder: Secondary | ICD-10-CM | POA: Diagnosis not present

## 2023-10-18 DIAGNOSIS — M25511 Pain in right shoulder: Secondary | ICD-10-CM | POA: Diagnosis not present

## 2023-10-21 DIAGNOSIS — M25511 Pain in right shoulder: Secondary | ICD-10-CM | POA: Diagnosis not present

## 2023-10-22 DIAGNOSIS — M25562 Pain in left knee: Secondary | ICD-10-CM | POA: Diagnosis not present

## 2023-10-26 DIAGNOSIS — L4050 Arthropathic psoriasis, unspecified: Secondary | ICD-10-CM | POA: Diagnosis not present

## 2023-10-26 DIAGNOSIS — M1990 Unspecified osteoarthritis, unspecified site: Secondary | ICD-10-CM | POA: Diagnosis not present

## 2023-10-26 DIAGNOSIS — R768 Other specified abnormal immunological findings in serum: Secondary | ICD-10-CM | POA: Diagnosis not present

## 2023-10-26 DIAGNOSIS — Z79899 Other long term (current) drug therapy: Secondary | ICD-10-CM | POA: Diagnosis not present

## 2023-10-26 DIAGNOSIS — L409 Psoriasis, unspecified: Secondary | ICD-10-CM | POA: Diagnosis not present

## 2023-10-26 DIAGNOSIS — M79643 Pain in unspecified hand: Secondary | ICD-10-CM | POA: Diagnosis not present

## 2023-10-29 DIAGNOSIS — M25511 Pain in right shoulder: Secondary | ICD-10-CM | POA: Diagnosis not present

## 2023-11-02 DIAGNOSIS — M25511 Pain in right shoulder: Secondary | ICD-10-CM | POA: Diagnosis not present

## 2023-11-04 DIAGNOSIS — M25511 Pain in right shoulder: Secondary | ICD-10-CM | POA: Diagnosis not present

## 2023-11-13 ENCOUNTER — Telehealth: Payer: Self-pay | Admitting: Medical

## 2023-11-13 NOTE — Telephone Encounter (Signed)
P.A. PRALUENT renewal

## 2023-11-16 ENCOUNTER — Ambulatory Visit: Payer: Medicare Other

## 2023-11-16 DIAGNOSIS — Z0000 Encounter for general adult medical examination without abnormal findings: Secondary | ICD-10-CM

## 2023-11-16 NOTE — Progress Notes (Signed)
Subjective:   Paula Meyer is a 72 y.o. female who presents for Medicare Annual (Subsequent) preventive examination.  Visit Complete: Virtual I connected with  Dianna Limbo on 11/16/23 by a audio enabled telemedicine application and verified that I am speaking with the correct person using two identifiers.  Patient Location: Home  Provider Location: Office/Clinic  I discussed the limitations of evaluation and management by telemedicine. The patient expressed understanding and agreed to proceed.  Vital Signs: Because this visit was a virtual/telehealth visit, some criteria may be missing or patient reported. Any vitals not documented were not able to be obtained and vitals that have been documented are patient reported.    Cardiac Risk Factors include: advanced age (>16men, >60 women);hypertension     Objective:    Today's Vitals   There is no height or weight on file to calculate BMI.     11/16/2023    3:15 PM 11/13/2022   11:04 AM 04/09/2022    9:07 AM 04/01/2022    9:19 AM 10/24/2021    2:23 PM 02/29/2020   11:20 AM 06/15/2016   12:59 PM  Advanced Directives  Does Patient Have a Medical Advance Directive? No No No No Yes No No  Type of Psychiatric nurse of Healthcare Power of Attorney in Chart?     No - copy requested    Would patient like information on creating a medical advance directive?   No - Patient declined Yes (MAU/Ambulatory/Procedural Areas - Information given)  Yes (ED - Information included in AVS) No - patient declined information    Current Medications (verified) Outpatient Encounter Medications as of 11/16/2023  Medication Sig   Accu-Chek Softclix Lancets lancets Test 1-2 times daily   acetaminophen (TYLENOL) 500 MG tablet Take 500 mg by mouth every 8 (eight) hours as needed for moderate pain.   albuterol (VENTOLIN HFA) 108 (90 Base) MCG/ACT inhaler INHALE 2 PUFFS INTO THE LUNGS EVERY 4 HOURS AS NEEDED FOR  COUGH, WHEEZING OR SHORTNESS OF BREATH   Alirocumab (PRALUENT) 150 MG/ML SOAJ Inject 150 mg into the skin every 14 (fourteen) days.   b complex vitamins capsule Take 1 capsule by mouth every other day.   Cholecalciferol (VITAMIN D) 50 MCG (2000 UT) tablet Take 4,000 Units by mouth daily.   diclofenac Sodium (VOLTAREN) 1 % GEL APPLY 2 GRAMS TO AFFECTED AREA 4 TIMES A DAY   Multiple Vitamin (MULTIVITAMIN ADULT PO) Take by mouth.   naproxen (NAPROSYN) 500 MG tablet Take 500 mg by mouth daily as needed.   Potassium 99 MG TABS Take 2 tablets by mouth daily.   SKYRIZI PEN 150 MG/ML pen    valsartan-hydrochlorothiazide (DIOVAN-HCT) 80-12.5 MG tablet Take 1 tablet by mouth daily.   amLODipine (NORVASC) 10 MG tablet TAKE 1 TABLET BY MOUTH EVERY DAY (Patient not taking: Reported on 11/16/2023)   atorvastatin (LIPITOR) 20 MG tablet TAKE 1 TABLET BY MOUTH EVERY DAY (Patient not taking: Reported on 11/16/2023)   Glucose Blood (BLOOD GLUCOSE TEST STRIPS) STRP Test 1-2 times daily. Pt uses accu-chek guide me meter (Patient not taking: Reported on 11/16/2023)   golimumab (SIMPONI ARIA) 50 MG/4ML SOLN injection Inject 50 mg into the vein. Every 3 months (Patient not taking: Reported on 11/16/2023)   magnesium oxide (MAG-OX) 400 (240 Mg) MG tablet Take 400 mg by mouth daily. 360mg  (Patient not taking: Reported on 11/16/2023)   meloxicam (MOBIC) 7.5 MG tablet TAKE  1 TABLET BY MOUTH EVERY DAY (Patient not taking: Reported on 11/16/2023)   No facility-administered encounter medications on file as of 11/16/2023.    Allergies (verified) Paxil [paroxetine hcl], Shellfish allergy, Bupropion hcl, Contrast media [iodinated contrast media], Crestor [rosuvastatin], Lisinopril, and Tramadol   History: Past Medical History:  Diagnosis Date   Allergy    Anemia    Anxiety    Aortic atherosclerosis (HCC) 2022   thoracic   Arthritis    Asthma    CAD (coronary artery disease)    a. coronary calcium score was 2,498 (99th  percentile for age and sex) in 07/2021; b. negative Myoview in 10/2021   Colitis    Depression    Heart murmur    Hx of colonic polyps    Hyperlipidemia    Hypertension    Osteoarthritis    Psoriatic arthritis (HCC)    Past Surgical History:  Procedure Laterality Date   COLONOSCOPY  2018   LUMBAR LAMINECTOMY/DECOMPRESSION MICRODISCECTOMY N/A 04/09/2022   Procedure: Microlumbar decompression Lumbar five -Sacral one right;  Surgeon: Jene Every, MD;  Location: [facility] OR;  Service: Orthopedics;  Laterality: N/A;   TUBAL LIGATION     WISDOM TOOTH EXTRACTION     Family History  Problem Relation Age of Onset   Heart attack Brother    Hyperlipidemia Brother    Hypertension Brother    Heart disease Brother    Arthritis Other    Depression Other    Colon cancer Neg Hx    Stomach cancer Neg Hx    Social History   Socioeconomic History   Marital status: Divorced    Spouse name: Not on file   Number of children: 2   Years of education: Not on file   Highest education level: Not on file  Occupational History   Not on file  Tobacco Use   Smoking status: Former    Current packs/day: 0.00    Types: Cigarettes    Start date: 58    Quit date: 1989    Years since quitting: 35.9    Passive exposure: Never   Smokeless tobacco: Never  Vaping Use   Vaping status: Never Used  Substance and Sexual Activity   Alcohol use: Not Currently    Alcohol/week: 3.0 standard drinks of alcohol    Types: 3 Glasses of wine per week    Comment: rarely   Drug use: No   Sexual activity: Not Currently  Other Topics Concern   Not on file  Social History Narrative   Lives alone and her dog.   Retired.   Children 2.   Has 3 story house.  02/2022.   Social Determinants of Health   Financial Resource Strain: Low Risk  (11/16/2023)   Overall Financial Resource Strain (CARDIA)    Difficulty of Paying Living Expenses: Not hard at all  Food Insecurity: No Food Insecurity (11/16/2023)   Hunger Vital Sign     Worried About Running Out of Food in the Last Year: Never true    Ran Out of Food in the Last Year: Never true  Transportation Needs: No Transportation Needs (11/16/2023)   PRAPARE - Administrator, Civil Service (Medical): No    Lack of Transportation (Non-Medical): No  Physical Activity: Inactive (11/16/2023)   Exercise Vital Sign    Days of Exercise per Week: 0 days    Minutes of Exercise per Session: 0 min  Stress: No Stress Concern Present (11/16/2023)   [employer] of  Occupational Health - Occupational Stress Questionnaire    Feeling of Stress : Not at all  Social Connections: Moderately Isolated (11/16/2023)   Social Connection and Isolation Panel [NHANES]    Frequency of Communication with Friends and Family: More than three times a week    Frequency of Social Gatherings with Friends and Family: Not on file    Attends Religious Services: 1 to 4 times per year    Active Member of [employer] or Organizations: No    Attends Engineer, structural: Never    Marital Status: Divorced    Tobacco Counseling Counseling given: Not Answered   Clinical Intake:  Pre-visit preparation completed: Yes  Pain : No/denies pain     Nutritional Risks: None Diabetes: No  How often do you need to have someone help you when you read instructions, pamphlets, or other written materials from your doctor or pharmacy?: 1 - Never  Interpreter Needed?: No  Information entered by :: NAllen LPN   Activities of Daily Living    11/16/2023    3:02 PM  In your present state of health, do you have any difficulty performing the following activities:  Hearing? 1  Comment tinnitus  Vision? 0  Difficulty concentrating or making decisions? 1  Comment trouble concentrating  Walking or climbing stairs? 0  Dressing or bathing? 0  Doing errands, shopping? 0  Preparing Food and eating ? N  Using the Toilet? N  In the past six months, have you accidently leaked urine? N  Do you  have problems with loss of bowel control? N  Managing your Medications? N  Managing your Finances? N  Housekeeping or managing your Housekeeping? N    Patient Care Team: Tysinger, Kermit Balo, PA-C as PCP - General (Family Medicine) O'Neal, Ronnald Ramp, MD as PCP - Cardiology (Cardiology) Philip Aspen, DO as Consulting Physician (Obstetrics and Gynecology) Jene Every, MD as Consulting Physician (Orthopedic Surgery)  Indicate any recent Medical Services you may have received from other than [facility] providers in the past year (date may be approximate).     Assessment:   This is a routine wellness examination for Gennette.  Hearing/Vision screen Hearing Screening - Comments:: Has tinnitus Vision Screening - Comments:: No regular eye exams,   Goals Addressed             This Visit's Progress    Patient Stated       11/16/2023, wants to exercise consistently       Depression Screen    11/16/2023    3:17 PM 11/13/2022   11:05 AM 08/10/2022    1:30 PM 07/01/2022   11:42 AM 06/10/2022   11:25 AM 03/05/2022    8:31 AM 10/24/2021    2:28 PM  PHQ 2/9 Scores  PHQ - 2 Score 1 0 2 0 2 0 0  PHQ- 9 Score 10 0 4 0 17 0     Fall Risk    11/16/2023    3:16 PM 02/01/2023    3:45 PM 11/13/2022   11:05 AM 07/01/2022   11:41 AM 03/05/2022    8:33 AM  Fall Risk   Falls in the past year? 0 0 0 0 0  Number falls in past yr: 0 0 0 0 0  Injury with Fall? 0 0 0 0 0  Risk for fall due to : Medication side effect No Fall Risks Medication side effect No Fall Risks No Fall Risks  Follow up Falls prevention discussed;Falls evaluation completed Falls evaluation  completed Falls prevention discussed;Education provided;Falls evaluation completed Falls evaluation completed Falls evaluation completed    MEDICARE RISK AT HOME: Medicare Risk at Home Any stairs in or around the home?: Yes If so, are there any without handrails?: No Home free of loose throw rugs in walkways, pet beds, electrical cords,  etc?: Yes Adequate lighting in your home to reduce risk of falls?: Yes Life alert?: No Use of a cane, walker or w/c?: No Grab bars in the bathroom?: No Shower chair or bench in shower?: Yes Elevated toilet seat or a handicapped toilet?: Yes  TIMED UP AND GO:  Was the test performed?  No    Cognitive Function:        11/16/2023    3:20 PM 11/13/2022   11:09 AM 10/24/2021    2:36 PM 02/29/2020   11:11 AM  6CIT Screen  What Year? 0 points 0 points 0 points 0 points  What month? 0 points 0 points 0 points 0 points  What time? 0 points 0 points 0 points 0 points  Count back from 20 0 points 0 points 0 points 0 points  Months in reverse 2 points 0 points 0 points 0 points  Repeat phrase 2 points 2 points 0 points 0 points  Total Score 4 points 2 points 0 points 0 points    Immunizations Immunization History  Administered Date(s) Administered   Influenza, High Dose Seasonal PF 01/12/2019   PFIZER(Purple Top)SARS-COV-2 Vaccination 02/04/2020, 02/28/2020   PNEUMOCOCCAL CONJUGATE-20 03/05/2022   Pneumococcal Conjugate-13 08/23/2018   Pneumococcal Polysaccharide-23 07/10/2013   Tdap 04/02/2011    TDAP status: Due, Education has been provided regarding the importance of this vaccine. Advised may receive this vaccine at local pharmacy or Health Dept. Aware to provide a copy of the vaccination record if obtained from local pharmacy or Health Dept. Verbalized acceptance and understanding.  Flu Vaccine status: Due, Education has been provided regarding the importance of this vaccine. Advised may receive this vaccine at local pharmacy or Health Dept. Aware to provide a copy of the vaccination record if obtained from local pharmacy or Health Dept. Verbalized acceptance and understanding.  Pneumococcal vaccine status: Up to date  Covid-19 vaccine status: Information provided on how to obtain vaccines.   Qualifies for Shingles Vaccine? Yes   Zostavax completed No   Shingrix Completed?:  No.    Education has been provided regarding the importance of this vaccine. Patient has been advised to call insurance company to determine out of pocket expense if they have not yet received this vaccine. Advised may also receive vaccine at local pharmacy or Health Dept. Verbalized acceptance and understanding.  Screening Tests Health Maintenance  Topic Date Due   Zoster Vaccines- Shingrix (1 of 2) Never done   COVID-19 Vaccine (3 - Pfizer risk series) 03/27/2020   DTaP/Tdap/Td (2 - Td or Tdap) 04/01/2021   Colonoscopy  10/26/2022   INFLUENZA VACCINE  07/15/2023   MAMMOGRAM  11/12/2024   Medicare Annual Wellness (AWV)  11/15/2024   Pneumonia Vaccine 1+ Years old  Completed   DEXA SCAN  Completed   HPV VACCINES  Aged Out   Hepatitis C Screening  Discontinued    Health Maintenance  Health Maintenance Due  Topic Date Due   Zoster Vaccines- Shingrix (1 of 2) Never done   COVID-19 Vaccine (3 - Pfizer risk series) 03/27/2020   DTaP/Tdap/Td (2 - Td or Tdap) 04/01/2021   Colonoscopy  10/26/2022   INFLUENZA VACCINE  07/15/2023    Colorectal  cancer screening: Type of screening: Colonoscopy. Completed 10/26/2017. Repeat every 5 years  Mammogram status: scheduled for next week  Bone Density status: Completed 07/07/2019.   Lung Cancer Screening: (Low Dose CT Chest recommended if Age 76-80 years, 20 pack-year currently smoking OR have quit w/in 15years.) does not qualify.   Lung Cancer Screening Referral: no  Additional Screening:  Hepatitis C Screening: does not qualify;   Vision Screening: Recommended annual ophthalmology exams for early detection of glaucoma and other disorders of the eye. Is the patient up to date with their annual eye exam?  No  Who is the provider or what is the name of the office in which the patient attends annual eye exams? none If pt is not established with a provider, would they like to be referred to a provider to establish care? No .   Dental  Screening: Recommended annual dental exams for proper oral hygiene  Diabetic Foot Exam: n/a  Community Resource Referral / Chronic Care Management: CRR required this visit?  No   CCM required this visit?  No     Plan:     I have personally reviewed and noted the following in the patient's chart:   Medical and social history Use of alcohol, tobacco or illicit drugs  Current medications and supplements including opioid prescriptions. Patient is not currently taking opioid prescriptions. Functional ability and status Nutritional status Physical activity Advanced directives List of other physicians Hospitalizations, surgeries, and ER visits in previous 12 months Vitals Screenings to include cognitive, depression, and falls Referrals and appointments  In addition, I have reviewed and discussed with patient certain preventive protocols, quality metrics, and best practice recommendations. A written personalized care plan for preventive services as well as general preventive health recommendations were provided to patient.     Barb Merino, LPN   [id number]/[id number]   After Visit Summary: (MyChart) Due to this being a telephonic visit, the after visit summary with patients personalized plan was offered to patient via MyChart   Nurse Notes: none

## 2023-11-16 NOTE — Patient Instructions (Signed)
Paula Meyer , Thank you for taking time to come for your Medicare Wellness Visit. I appreciate your ongoing commitment to your health goals. Please review the following plan we discussed and let me know if I can assist you in the future.   Referrals/Orders/Follow-Ups/Clinician Recommendations: none  This is a list of the screening recommended for you and due dates:  Health Maintenance  Topic Date Due   Zoster (Shingles) Vaccine (1 of 2) Never done   COVID-19 Vaccine (3 - Pfizer risk series) 03/27/2020   DTaP/Tdap/Td vaccine (2 - Td or Tdap) 04/01/2021   Colon Cancer Screening  10/26/2022   Flu Shot  07/15/2023   Mammogram  11/12/2024   Medicare Annual Wellness Visit  11/15/2024   Pneumonia Vaccine  Completed   DEXA scan (bone density measurement)  Completed   HPV Vaccine  Aged Out   Hepatitis C Screening  Discontinued    Advanced directives: (ACP Link)Information on Advanced Care Planning can be found at [facility] [place] Advance Health Care Directives Advance Health Care Directives ([email or web address]   Next Medicare Annual Wellness Visit scheduled for next year: Yes  Insert Preventive Care attachment Insert FALL PREVENTION attachment if needed

## 2023-11-22 DIAGNOSIS — Z01419 Encounter for gynecological examination (general) (routine) without abnormal findings: Secondary | ICD-10-CM | POA: Diagnosis not present

## 2023-11-22 DIAGNOSIS — Z124 Encounter for screening for malignant neoplasm of cervix: Secondary | ICD-10-CM | POA: Diagnosis not present

## 2023-11-22 DIAGNOSIS — Z1231 Encounter for screening mammogram for malignant neoplasm of breast: Secondary | ICD-10-CM | POA: Diagnosis not present

## 2023-11-22 LAB — HM MAMMOGRAPHY

## 2023-11-22 NOTE — Telephone Encounter (Signed)
Additional Documentation faxed for P.A.

## 2023-11-23 ENCOUNTER — Ambulatory Visit: Payer: Medicare Other | Admitting: Medical

## 2023-11-23 ENCOUNTER — Telehealth: Payer: Self-pay | Admitting: Medical

## 2023-11-23 VITALS — BP 112/70 | HR 71 | Wt 213.6 lb

## 2023-11-23 DIAGNOSIS — J069 Acute upper respiratory infection, unspecified: Secondary | ICD-10-CM

## 2023-11-23 DIAGNOSIS — R4589 Other symptoms and signs involving emotional state: Secondary | ICD-10-CM | POA: Diagnosis not present

## 2023-11-23 DIAGNOSIS — J45909 Unspecified asthma, uncomplicated: Secondary | ICD-10-CM | POA: Diagnosis not present

## 2023-11-23 DIAGNOSIS — Z1211 Encounter for screening for malignant neoplasm of colon: Secondary | ICD-10-CM

## 2023-11-23 DIAGNOSIS — B9689 Other specified bacterial agents as the cause of diseases classified elsewhere: Secondary | ICD-10-CM

## 2023-11-23 DIAGNOSIS — J4531 Mild persistent asthma with (acute) exacerbation: Secondary | ICD-10-CM

## 2023-11-23 DIAGNOSIS — H9193 Unspecified hearing loss, bilateral: Secondary | ICD-10-CM | POA: Diagnosis not present

## 2023-11-23 DIAGNOSIS — H9313 Tinnitus, bilateral: Secondary | ICD-10-CM | POA: Diagnosis not present

## 2023-11-23 MED ORDER — ALBUTEROL SULFATE HFA 108 (90 BASE) MCG/ACT IN AERS: INHALATION_SPRAY | RESPIRATORY_TRACT | 2 refills | Status: DC

## 2023-11-23 MED ORDER — FLUTICASONE-SALMETEROL 100-50 MCG/ACT IN AEPB: 1.0000 | INHALATION_SPRAY | Freq: Two times a day (BID) | RESPIRATORY_TRACT | 3 refills | Status: DC

## 2023-11-23 MED ORDER — VENLAFAXINE HCL ER 37.5 MG PO CP24: 37.5000 mg | ORAL_CAPSULE | Freq: Every day | ORAL | 1 refills | Status: DC

## 2023-11-23 NOTE — Telephone Encounter (Signed)
Pt states the medication her sister takes is called Effexor

## 2023-11-23 NOTE — Progress Notes (Signed)
Subjective:  Paula Meyer is a 72 y.o. female who presents for Chief Complaint  Patient presents with   Tinnitus    Ringing in ears for 9-10 years but just getting more noticeable Something for Depression- thinks its due to holiday     Here for concerns about mood.  Lately been feeling down.   She notes that she continues to have grief missing her brother who passed away in 12/07/2021 mother passed away last year in December 07, 2022.  Around the holidays gets a little bit more down and depressed.  No thoughts of harm to self or others.  She did counseling this past year but did not find it to be helpful.  She feels like she needs to be on the medication although she has had some side effects and problems with other medicines prior including Wellbutrin Paxil and Cymbalta.  Cymbalta made her bite her tongue a lot, Paxil made her grind her teeth, and Wellbutrin gave her palpitations  She denies hallucinations or delusions.  She is doing some socialization.  She talks to the her granddaughter regularly every day.  She gets out of the house and interact with people.  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  Her sister takes Effexor.  She would like to try that as her sister does pretty well on Effexor.  She does not feel particular anxious, more depressed mood and anything.  She also has a lack of desire to do things she normally does  She notes concerns about breathing.  She feels that she cannot take a deep breath.  She likes to sing but lately she cannot sing with his much stamina with her breathing.  She has a history of asthma.  Uses albuterol some.  No chest pain, no palpitations, no edema.  She has chronic ringing in the ears for many years.  She has seen audiology years ago and they said that she had an incurable tenderness.  She was offered some treatment options but she is not currently doing any of those.  No other aggravating or relieving factors.    No other c/o.  Past Medical History:   Diagnosis Date   Allergy    Anemia    Anxiety    Aortic atherosclerosis (HCC) 2021/12/07   thoracic   Arthritis    Asthma    CAD (coronary artery disease)    a. coronary calcium score was 2,498 (99th percentile for age and sex) in 07/2021; b. negative Myoview in 10/2021   Colitis    Depression    Heart murmur    Hx of colonic polyps    Hyperlipidemia    Hypertension    Osteoarthritis    Psoriatic arthritis (HCC)    Current Outpatient Medications on File Prior to Visit  Medication Sig Dispense Refill   Alirocumab (PRALUENT) 150 MG/ML SOAJ Inject 150 mg into the skin every 14 (fourteen) days. 2 mL 11   b complex vitamins capsule Take 1 capsule by mouth every other day.     Cholecalciferol (VITAMIN D) 50 MCG (2000 UT) tablet Take 4,000 Units by mouth daily.     diclofenac Sodium (VOLTAREN) 1 % GEL APPLY 2 GRAMS TO AFFECTED AREA 4 TIMES A DAY     magnesium oxide (MAG-OX) 400 (240 Mg) MG tablet Take 400 mg by mouth daily. 360mg      Multiple Vitamin (MULTIVITAMIN ADULT PO) Take by mouth.     naproxen (NAPROSYN) 500 MG tablet Take 500 mg by mouth  daily as needed.     Potassium 99 MG TABS Take 2 tablets by mouth daily.     SKYRIZI PEN 150 MG/ML pen      valsartan-hydrochlorothiazide (DIOVAN-HCT) 80-12.5 MG tablet Take 1 tablet by mouth daily.     Accu-Chek Softclix Lancets lancets Test 1-2 times daily 100 each 1   acetaminophen (TYLENOL) 500 MG tablet Take 500 mg by mouth every 8 (eight) hours as needed for moderate pain.     Glucose Blood (BLOOD GLUCOSE TEST STRIPS) STRP Test 1-2 times daily. Pt uses accu-chek guide me meter (Patient not taking: Reported on 11/16/2023) 100 strip 1   No current facility-administered medications on file prior to visit.     The following portions of the patient's history were reviewed and updated as appropriate: allergies, current medications, past family history, past medical history, past social history, past surgical history and problem  list.  ROS Otherwise as in subjective above     Objective: BP 112/70   Pulse 71   Wt 213 lb 9.6 oz (96.9 kg)   LMP 04/02/2011   BMI 35.82 kg/m   General appearance: alert, no distress, well developed, well nourished HEENT: normocephalic, sclerae anicteric, conjunctiva pink and moist, TMs pearly, nares patent, no discharge or erythema, pharynx normal Oral cavity: MMM, no lesions Pulses: 2+ radial pulses, 2+ pedal pulses, normal cap refill Ext: no edema Psych: Pleasant, answers questions appropriately    Assessment: Encounter Diagnoses  Name Primary?   Depressed mood Yes   Tinnitus of both ears    Moderate asthma without complication, unspecified whether persistent    Screening for colon cancer    Bacterial URI    Mild persistent asthma with acute exacerbation    Bilateral hearing loss, unspecified hearing loss type       Plan: Depressed mood-she went to counseling within the past year prior referral but did not like that helped a lot.  She declines counseling currently.  She is agreeable to trial of medication.  Begin Effexor trial.  Discussed risk and benefits of medication..  We discussed the prior medicine she has not tolerated including Paxil, Wellbutrin and others.  Her sister takes Effexor and does well on this per patient.  Tinnitus -she denies prior evaluation by audiology.  At 1 point hearing aids or other treatment were recommended.  This was years ago.  Referral back for updated consult with ENT  Asthma -begin trial of Advair preventative inhaler, continue albuterol rescue inhaler as needed.  Discussed proper use of each medicine, risk and benefits of medication.  [name] was seen today for tinnitus.  Diagnoses and all orders for this visit:  Depressed mood  Tinnitus of both ears -     Ambulatory referral to ENT  Moderate asthma without complication, unspecified whether persistent  Screening for colon cancer -     Ambulatory referral to  Gastroenterology  Bacterial URI -     albuterol (VENTOLIN HFA) 108 (90 Base) MCG/ACT inhaler; INHALE 2 PUFFS INTO THE LUNGS EVERY 4 HOURS AS NEEDED FOR COUGH, WHEEZING OR SHORTNESS OF BREATH  Mild persistent asthma with acute exacerbation -     albuterol (VENTOLIN HFA) 108 (90 Base) MCG/ACT inhaler; INHALE 2 PUFFS INTO THE LUNGS EVERY 4 HOURS AS NEEDED FOR COUGH, WHEEZING OR SHORTNESS OF BREATH  Bilateral hearing loss, unspecified hearing loss type -     Ambulatory referral to ENT  Other orders -     fluticasone-salmeterol (ADVAIR) 100-50 MCG/ACT AEPB; Inhale 1 puff into  the lungs 2 (two) times daily. -     venlafaxine XR (EFFEXOR XR) 37.5 MG 24 hr capsule; Take 1 capsule (37.5 mg total) by mouth daily with breakfast.    Follow up: 22mo

## 2023-11-24 ENCOUNTER — Encounter (INDEPENDENT_AMBULATORY_CARE_PROVIDER_SITE_OTHER): Payer: Self-pay | Admitting: Otolaryngology

## 2023-11-24 NOTE — Telephone Encounter (Signed)
Pharmacy Patient Advocate Encounter  Received notification from [facility] that Prior Authorization for Praluent has been APPROVED from 12.10.24 to 12.10.25

## 2023-11-25 NOTE — Telephone Encounter (Signed)
Sent pt mychart message

## 2023-11-30 LAB — HM PAP SMEAR: HPV, high-risk: NEGATIVE

## 2023-12-04 ENCOUNTER — Other Ambulatory Visit: Payer: Self-pay | Admitting: Medical

## 2023-12-13 ENCOUNTER — Telehealth: Payer: Self-pay | Admitting: Medical

## 2023-12-13 DIAGNOSIS — H9193 Unspecified hearing loss, bilateral: Secondary | ICD-10-CM

## 2023-12-13 DIAGNOSIS — H9313 Tinnitus, bilateral: Secondary | ICD-10-CM

## 2023-12-13 NOTE — Telephone Encounter (Signed)
I have put referral in for this

## 2023-12-13 NOTE — Telephone Encounter (Signed)
Pt called and says you all discussed her ear issues at last visit. She is wanting to know if she can get a referral to a specialist. She has a place she prefers, [facility] [address] [phone].

## 2023-12-16 ENCOUNTER — Other Ambulatory Visit: Payer: Self-pay | Admitting: Medical

## 2023-12-21 ENCOUNTER — Encounter (INDEPENDENT_AMBULATORY_CARE_PROVIDER_SITE_OTHER): Payer: Self-pay | Admitting: Otolaryngology

## 2024-02-09 ENCOUNTER — Institutional Professional Consult (permissible substitution) (INDEPENDENT_AMBULATORY_CARE_PROVIDER_SITE_OTHER): Payer: Medicare Other

## 2024-03-12 ENCOUNTER — Other Ambulatory Visit: Payer: Self-pay | Admitting: Medical

## 2024-03-13 ENCOUNTER — Encounter (INDEPENDENT_AMBULATORY_CARE_PROVIDER_SITE_OTHER): Payer: Self-pay

## 2024-03-13 ENCOUNTER — Ambulatory Visit (INDEPENDENT_AMBULATORY_CARE_PROVIDER_SITE_OTHER): Payer: Medicare Other

## 2024-03-13 ENCOUNTER — Ambulatory Visit (INDEPENDENT_AMBULATORY_CARE_PROVIDER_SITE_OTHER): Admitting: Audiology

## 2024-03-13 VITALS — BP 130/78 | HR 85 | Ht 64.0 in | Wt 200.0 lb

## 2024-03-13 DIAGNOSIS — H903 Sensorineural hearing loss, bilateral: Secondary | ICD-10-CM | POA: Diagnosis not present

## 2024-03-13 DIAGNOSIS — H9319 Tinnitus, unspecified ear: Secondary | ICD-10-CM

## 2024-03-13 DIAGNOSIS — H9313 Tinnitus, bilateral: Secondary | ICD-10-CM

## 2024-03-13 NOTE — Progress Notes (Signed)
  [address] [place], [place] [address] [phone]  Audiological Evaluation    Name: Paula Meyer     DOB:   10-08-1951      MRN:   [id number]                                                                                     Service Date: 03/13/2024     Accompanied by: unaccompanied    Patient comes today after Dr. Suszanne Conners, ENT sent a referral for a hearing evaluation due to concerns with tinnitus.   Symptoms Yes Details  Hearing loss  []    Tinnitus  [x]  Left sided since 2016, but since she is now retired she perceives it more often.  Ear pain/ infections/pressure  []    Balance problems  []    Noise exposure history  []    Previous ear surgeries  []    Family history of hearing loss  [x]  Mother and older brother both has left ear tinnitus  Amplification  []    Other  []      Otoscopy: Right ear: Clear external ear canals and notable landmarks visualized on the tympanic membrane. Left ear:  Clear external ear canals and notable landmarks visualized on the tympanic membrane.  Tympanometry: Right ear: Type A- Normal external ear canal volume with normal middle ear pressure and tympanic membrane compliance Left ear: Type A- Normal external ear canal volume with normal middle ear pressure and tympanic membrane compliance   Pure tone Audiometry: Right ear- Normal hearing from [phone] Hz, then a mild to moderate presumably sensorineural hearing loss from 6000 Hz - 8000 Hz. Left ear-  Normal hearing from [phone], then mild to moderately severe   sensorineural hearing loss from 4000 Hz - 8000 Hz.  Speech Audiometry: Right ear- Speech Reception Threshold (SRT) was obtained at 10 dBHL. Left ear-Speech Reception Threshold (SRT) was obtained at 10 dBHL.   Word Recognition Score Tested using NU-6 (MLV) Right ear: 96% was obtained at a presentation level of 70 dBHL with contralateral masking which is deemed as  excellent. Left ear: 96% was obtained at a presentation  level of 70 dBHL with contralateral masking which is deemed as  excellent.   The hearing test results were completed under headphones and results are deemed to be of good reliability. Test technique:  conventional    Impression: There is a slight difference in pure-tone thresholds between ears from [phone] Hz, worse in the left ear.   Recommendations: Follow up with ENT as scheduled for today. Return for a hearing evaluation if concerns with hearing changes arise or per MD recommendation., Consider various tinnitus strategies, including the use of a sound generator, hearing aids with at least a spot check with inserts(after medical clearance) , and/or tinnitus retraining therapy. A copy of the audiogram was provided to the patient.   Donis Kotowski MARIE LEROUX-MARTINEZ, AUD

## 2024-03-13 NOTE — Progress Notes (Unsigned)
 Patient ID: Paula Meyer, female   DOB: 09/14/1951, 73 y.o.   MRN: [id number]  CC: Bilateral tinnitus  HPI:  Paula Meyer is a 73 y.o. female who presents today complaining of bilateral tinnitus for 10+ years.  She describes her tinnitus as a constant high-pitched ringing noise.  It is nonpulsatile.  She denies any otalgia, otorrhea, or vertigo.  She has no recent otitis media or otitis externa.  She has no previous ENT surgery.  She also denies any significant hearing difficulty.  Past Medical History:  Diagnosis Date   Allergy    Anemia    Anxiety    Aortic atherosclerosis (HCC) 2022   thoracic   Arthritis    Asthma    CAD (coronary artery disease)    a. coronary calcium score was 2,498 (99th percentile for age and sex) in 07/2021; b. negative Myoview in 10/2021   Colitis    Depression    Heart murmur    Hx of colonic polyps    Hyperlipidemia    Hypertension    Osteoarthritis    Psoriatic arthritis [facility])     Past Surgical History:  Procedure Laterality Date   COLONOSCOPY  2018   LUMBAR LAMINECTOMY/DECOMPRESSION MICRODISCECTOMY N/A 04/09/2022   Procedure: Microlumbar decompression Lumbar five -Sacral one right;  Surgeon: Jene Every, MD;  Location: [facility] OR;  Service: Orthopedics;  Laterality: N/A;   TUBAL LIGATION     WISDOM TOOTH EXTRACTION      Family History  Problem Relation Age of Onset   Heart attack Brother    Hyperlipidemia Brother    Hypertension Brother    Heart disease Brother    Arthritis Other    Depression Other    Colon cancer Neg Hx    Stomach cancer Neg Hx     Social History:  reports that she quit smoking about 36 years ago. Her smoking use included cigarettes. She started smoking about 40 years ago. She has never been exposed to tobacco smoke. She has never used smokeless tobacco. She reports that she does not currently use alcohol after a past usage of about 3.0 standard drinks of alcohol per week. She reports that she does not use  drugs.  Allergies:  Allergies  Allergen Reactions   Paxil [Paroxetine Hcl]     Makes her grind her teeth   Shellfish Allergy Shortness Of Breath and Itching   Bupropion Hcl Other (See Comments)    palpitations   Contrast Media [Iodinated Contrast Media] Hives and Itching   Crestor [Rosuvastatin]     Intolerance with brain fog, liver enzymes elevated   Lisinopril     cough   Repatha [Evolocumab] Other (See Comments)    Memory issues   Tramadol     Dependency, avoid narcotics    Prior to Admission medications   Medication Sig Start Date End Date Taking? Authorizing Provider  Accu-Chek Softclix Lancets lancets Test 1-2 times daily 05/12/22  Yes Tysinger, Kermit Balo, PA-C  acetaminophen (TYLENOL) 500 MG tablet Take 500 mg by mouth every 8 (eight) hours as needed for moderate pain.   Yes [provider]  albuterol (VENTOLIN HFA) 108 (90 Base) MCG/ACT inhaler INHALE 2 PUFFS INTO THE LUNGS EVERY 4 HOURS AS NEEDED FOR COUGH, WHEEZING OR SHORTNESS OF BREATH 11/23/23  Yes Tysinger, Kermit Balo, PA-C  Alirocumab (PRALUENT) 150 MG/ML SOAJ Inject 150 mg into the skin every 14 (fourteen) days. 01/12/23  Yes Tysinger, Kermit Balo, PA-C  b complex vitamins capsule Take 1  capsule by mouth every other day.   Yes [provider]  Cholecalciferol (VITAMIN D) 50 MCG (2000 UT) tablet Take 4,000 Units by mouth daily.   Yes [provider]  diclofenac Sodium (VOLTAREN) 1 % GEL APPLY 2 GRAMS TO AFFECTED AREA 4 TIMES A DAY   Yes [provider]  fluticasone-salmeterol (ADVAIR) 100-50 MCG/ACT AEPB Inhale 1 puff into the lungs 2 (two) times daily. 11/23/23  Yes Tysinger, Kermit Balo, PA-C  Glucose Blood (BLOOD GLUCOSE TEST STRIPS) STRP Test 1-2 times daily. Pt uses accu-chek guide me meter 05/12/22  Yes Tysinger, Kermit Balo, PA-C  magnesium oxide (MAG-OX) 400 (240 Mg) MG tablet Take 400 mg by mouth daily. 360mg    Yes [provider]  Multiple Vitamin (MULTIVITAMIN ADULT PO) Take by  mouth.   Yes [provider]  naproxen (NAPROSYN) 500 MG tablet Take 500 mg by mouth daily as needed. 10/26/23  Yes [provider]  Potassium 99 MG TABS Take 2 tablets by mouth daily.   Yes [provider]  SKYRIZI PEN 150 MG/ML pen  11/01/23  Yes [provider]  valsartan-hydrochlorothiazide (DIOVAN-HCT) 80-12.5 MG tablet TAKE 1 TABLET BY MOUTH EVERY DAY 12/06/23  Yes Tysinger, Kermit Balo, PA-C  venlafaxine XR (EFFEXOR-XR) 37.5 MG 24 hr capsule TAKE 1 CAPSULE BY MOUTH DAILY WITH BREAKFAST. 03/13/24  Yes Tysinger, Kermit Balo, PA-C    Blood pressure 130/78, pulse 85, height 5\' 4"  (1.626 m), weight 200 lb (90.7 kg), last menstrual period 04/02/2011, SpO2 94%. Exam: General: Communicates without difficulty, well nourished, no acute distress. Head: Normocephalic, no evidence injury, no tenderness, facial buttresses intact without stepoff. Face/sinus: No tenderness to palpation and percussion. Facial movement is normal and symmetric. Eyes: PERRL, EOMI. No scleral icterus, conjunctivae clear. Neuro: CN II exam reveals vision grossly intact.  No nystagmus at any point of gaze. Ears: Auricles well formed without lesions.  Ear canals are intact without mass or lesion.  No erythema or edema is appreciated.  The TMs are intact without fluid. Nose: External evaluation reveals normal support and skin without lesions.  Dorsum is intact.  Anterior rhinoscopy reveals normal mucosa over anterior aspect of inferior turbinates and intact septum.  No purulence noted. Oral:  Oral cavity and oropharynx are intact, symmetric, without erythema or edema.  Mucosa is moist without lesions. Neck: Full range of motion without pain.  There is no significant lymphadenopathy.  No masses palpable.  Thyroid bed within normal limits to palpation.  Parotid glands and submandibular glands equal bilaterally without mass.  Trachea is midline. Neuro:  CN 2-12 grossly intact.   Her hearing test shows bilateral  high-frequency sensorineural hearing loss.  Assessment: 1.  Bilateral symmetric high-frequency sensorineural hearing loss, likely secondary to routine presbycusis. 2.  The patient's tinnitus is likely a direct result of her hearing loss. 3.  Her ear canals, tympanic membranes, and middle ear spaces are all normal.  Plan: 1.  The physical exam findings and the hearing test results are reviewed with the patient. 2.  The strategies to cope with tinnitus, including the use of masker, hearing aids, tinnitus retraining therapy, and avoidance of caffeine and alcohol are discussed.  3.  The patient is reassured that no middle ear effusion or infection is noted. 4.  The patient will return for reevaluation in 1 year.  Nicklaus Alviar W Felishia Wartman 03/13/2024, 2:50 PM

## 2024-03-17 ENCOUNTER — Encounter: Payer: Self-pay | Admitting: Audiology

## 2024-03-21 ENCOUNTER — Ambulatory Visit: Admitting: Allergy and Immunology

## 2024-03-31 ENCOUNTER — Other Ambulatory Visit: Payer: Self-pay | Admitting: Medical

## 2024-04-19 ENCOUNTER — Other Ambulatory Visit: Payer: Self-pay | Admitting: Medical

## 2024-06-18 ENCOUNTER — Other Ambulatory Visit: Payer: Self-pay | Admitting: Medical

## 2024-08-24 ENCOUNTER — Ambulatory Visit: Admitting: Medical

## 2024-08-24 VITALS — BP 138/80 | HR 73 | Wt 203.6 lb

## 2024-08-24 DIAGNOSIS — E559 Vitamin D deficiency, unspecified: Secondary | ICD-10-CM

## 2024-08-24 DIAGNOSIS — R5383 Other fatigue: Secondary | ICD-10-CM | POA: Diagnosis not present

## 2024-08-24 DIAGNOSIS — R7301 Impaired fasting glucose: Secondary | ICD-10-CM

## 2024-08-24 DIAGNOSIS — M609 Myositis, unspecified: Secondary | ICD-10-CM

## 2024-08-24 DIAGNOSIS — L4050 Arthropathic psoriasis, unspecified: Secondary | ICD-10-CM

## 2024-08-24 DIAGNOSIS — J4530 Mild persistent asthma, uncomplicated: Secondary | ICD-10-CM

## 2024-08-24 DIAGNOSIS — M546 Pain in thoracic spine: Secondary | ICD-10-CM | POA: Diagnosis not present

## 2024-08-24 DIAGNOSIS — Z1211 Encounter for screening for malignant neoplasm of colon: Secondary | ICD-10-CM

## 2024-08-24 DIAGNOSIS — E785 Hyperlipidemia, unspecified: Secondary | ICD-10-CM

## 2024-08-24 DIAGNOSIS — J4531 Mild persistent asthma with (acute) exacerbation: Secondary | ICD-10-CM

## 2024-08-24 DIAGNOSIS — M150 Primary generalized (osteo)arthritis: Secondary | ICD-10-CM

## 2024-08-24 DIAGNOSIS — D518 Other vitamin B12 deficiency anemias: Secondary | ICD-10-CM

## 2024-08-24 DIAGNOSIS — G8929 Other chronic pain: Secondary | ICD-10-CM

## 2024-08-24 DIAGNOSIS — I10 Essential (primary) hypertension: Secondary | ICD-10-CM

## 2024-08-24 DIAGNOSIS — F418 Other specified anxiety disorders: Secondary | ICD-10-CM

## 2024-08-24 MED ORDER — FLUTICASONE-SALMETEROL 115-21 MCG/ACT IN AERO: 2.0000 | INHALATION_SPRAY | Freq: Two times a day (BID) | RESPIRATORY_TRACT | 5 refills | Status: AC

## 2024-08-24 MED ORDER — PRALUENT 150 MG/ML ~~LOC~~ SOAJ: 150.0000 mg | SUBCUTANEOUS | 5 refills | Status: AC

## 2024-08-24 MED ORDER — VALSARTAN 40 MG PO TABS: 40.0000 mg | ORAL_TABLET | Freq: Every day | ORAL | 2 refills | Status: DC

## 2024-08-24 MED ORDER — ALBUTEROL SULFATE HFA 108 (90 BASE) MCG/ACT IN AERS: INHALATION_SPRAY | RESPIRATORY_TRACT | 2 refills | Status: AC

## 2024-08-24 NOTE — Progress Notes (Unsigned)
 Name: Paula Meyer   Date of Visit: 08/25/24   Date of last visit with me: 06/18/2024   CHIEF COMPLAINT:  Chief Complaint  Patient presents with   Medical Management of Chronic Issues    Med check, declines flu shot today. Would like full blood work, lipids, tsh, blood counts, glucose       HPI:  Discussed the use of AI scribe software for clinical note transcription with the patient, who gave verbal consent to proceed.  History of Present Illness    Paula Meyer is a 73 year old female who presents for a medication check and evaluation of fatigue.  She has been experiencing persistent fatigue and low energy levels for the past few days. She questions whether her new medication, tirzepatide, which she started three weeks ago for weight loss, could be contributing to her tiredness. She administers 12.2 units of tirzepatide weekly, prescribed by a weight loss program called [facility].  She has a history of elevated creatinine levels noted approximately three months ago. She was previously on naproxen  for arthritis pain but discontinued it about three months ago due to concerns about her creatinine levels.  She expresses grief and depression following the loss of her dog in May, which she believes has contributed to her fatigue. She feels a reprieve when looking at pictures of her dog, despite her daughter's advice to stop.  She discusses issues with her inhaler, Wixela, which she finds expensive and is instructed to discard after 30 days, despite not using all the medication. She takes two puffs daily and finds the inhaler expensive, with concerns about discarding it after 30 days despite unused medication. She also uses a rescue inhaler, which is running low.  She is not currently taking Effexor  due to its sedative effects and has stopped her blood pressure medication, valsartan  HCT, for similar reasons. Her blood pressure was noted to be high during the visit, and she last  took her blood pressure medication about a month ago. At home, her blood pressure readings were around 120/70.  She restarted her Praluent  injections recently after a hiatus due to unspecified issues. She has a history of sleep apnea and used a CPAP machine in the past but has not had a recent sleep study. She is interested in having her blood work checked, including creatinine levels, due to her previous elevated results.  She mentions a referral for a colonoscopy, as her last one was in 2018. She is due for another screening.  She discusses her exercise routine, which has been disrupted since the loss of her dog. She used to walk 15 minutes in the morning and evening, often accompanied by her dog, but has struggled to maintain this routine since May. She has a treadmill at home and is considering resuming her exercise regimen.  No other aggravating or relieving factors.    No other c/o.    Past Medical History:  Diagnosis Date   Allergy    Anemia    Anxiety    Aortic atherosclerosis (HCC) 2022   thoracic   Arthritis    Asthma    CAD (coronary artery disease)    a. coronary calcium  score was 2,498 (99th percentile for age and sex) in 07/2021; b. negative Myoview  in 10/2021   Colitis    Depression    Heart murmur    Hx of colonic polyps    Hyperlipidemia    Hypertension    Osteoarthritis    Psoriatic arthritis (  HCC)    Current Outpatient Medications on File Prior to Visit  Medication Sig Dispense Refill   acetaminophen  (TYLENOL ) 500 MG tablet Take 500 mg by mouth every 8 (eight) hours as needed for moderate pain.     b complex vitamins capsule Take 1 capsule by mouth every other day.     diclofenac  Sodium (VOLTAREN ) 1 % GEL APPLY 2 GRAMS TO AFFECTED AREA 4 TIMES A DAY     magnesium oxide (MAG-OX) 400 (240 Mg) MG tablet Take 400 mg by mouth daily. 360mg      Multiple Vitamin (MULTIVITAMIN ADULT PO) Take by mouth.     Potassium 99 MG TABS Take 2 tablets by mouth daily.      SKYRIZI PEN 150 MG/ML pen      Accu-Chek Softclix Lancets lancets Test 1-2 times daily 100 each 1   Glucose Blood (BLOOD GLUCOSE TEST STRIPS) STRP Test 1-2 times daily. Pt uses accu-chek guide me meter 100 strip 1   No current facility-administered medications on file prior to visit.    The following portions of the patient's history were reviewed and updated as appropriate: allergies, current medications, past family history, past medical history, past social history, past surgical history and problem list.  ROS Otherwise as in subjective above    Objective: BP 138/80   Pulse 73   Wt 203 lb 9.6 oz (92.4 kg)   LMP 04/02/2011   BMI 34.95 kg/m   General appearance: alert, no distress, well developed, well nourished Neck: supple, no lymphadenopathy, no thyromegaly, no masses Heart: RRR, normal S1, S2, no murmurs Lungs: CTA bilaterally, no wheezes, rhonchi, or rales Pulses: 2+ radial pulses, 2+ pedal pulses, normal cap refill Ext: no edema   Assessment: Encounter Diagnoses  Name Primary?   Fatigue, unspecified type Yes   Screening for colon cancer    Other vitamin B12 deficiency anemia    Chronic bilateral thoracic back pain    Impaired fasting blood sugar    Psoriatic arthritis (HCC)    Vitamin D  deficiency disease    Depression with anxiety    Mild persistent asthma, unspecified whether complicated    Primary osteoarthritis involving multiple joints    Mild persistent asthma with acute exacerbation    Hyperlipidemia, unspecified hyperlipidemia type    Essential hypertension, benign    Statin-induced myositis      Plan: Hypertension Hypertension with recent high readings. Not taking valsartan  HCT due to side effects. Home readings previously normal. - Prescribe valsartan  40 mg daily. - Monitor home blood pressure.  Fatigue Chronic fatigue, possibly multifactorial. Tizepatide unlikely cause. Consider sleep apnea, depression, and lack of exercise as  contributors. - Order blood tests: hemoglobin A1c, thyroid  function, CBC, liver/kidney function, cholesterol, microalbumin, vitamin D , B12. - Consider sleep study if fatigue persists. - Encourage exercise.  Depression Depression likely exacerbated by loss of dog. Not taking Effexor  due to side effects. - Encourage exercise.  Chronic kidney disease Chronic kidney disease with previously elevated creatinine. Monitoring required. - Order kidney function tests and microalbumin.  Hyperlipidemia Hyperlipidemia managed with Praluent . Recent adherence issues. - Ensure Praluent  refills. - Order cholesterol test.  Asthma Asthma with current use of costly Wixela inhaler. Albuterol  used as rescue inhaler. - Switch to Advair: two puffs morning/night or one puff morning/night. - Continue albuterol  as needed. - Ensure albuterol  refills.  General Health Maintenance Due for colonoscopy. Previous in 2018.  - Refer for colonoscopy.     Paula Meyer was seen today for medical management of chronic  issues.  Diagnoses and all orders for this visit:  Fatigue, unspecified type -     TSH + free T4 -     CBC -     Comprehensive metabolic panel with GFR -     Vitamin D , 25-hydroxy -     Vitamin B12  Screening for colon cancer -     Ambulatory referral to Gastroenterology  Other vitamin B12 deficiency anemia -     Vitamin B12  Chronic bilateral thoracic back pain  Impaired fasting blood sugar -     Hemoglobin A1c  Psoriatic arthritis (HCC)  Vitamin D  deficiency disease -     Vitamin D , 25-hydroxy  Depression with anxiety  Mild persistent asthma, unspecified whether complicated  Primary osteoarthritis involving multiple joints  Mild persistent asthma with acute exacerbation -     albuterol  (VENTOLIN  HFA) 108 (90 Base) MCG/ACT inhaler; INHALE 2 PUFFS INTO THE LUNGS EVERY 4 HOURS AS NEEDED FOR COUGH, WHEEZING OR SHORTNESS OF BREATH  Hyperlipidemia, unspecified hyperlipidemia type -      Lipid panel  Essential hypertension, benign -     Comprehensive metabolic panel with GFR -     Microalbumin/Creatinine Ratio, Urine  Statin-induced myositis  Other orders -     fluticasone -salmeterol (ADVAIR HFA) 115-21 MCG/ACT inhaler; Inhale 2 puffs into the lungs 2 (two) times daily. -     valsartan  (DIOVAN ) 40 MG tablet; Take 1 tablet (40 mg total) by mouth daily. -     Alirocumab  (PRALUENT ) 150 MG/ML SOAJ; Inject 1 mL (150 mg total) into the skin every 14 (fourteen) days.   Spent > 45 minutes face to face with patient in discussion of symptoms, evaluation, plan and recommendations.    Follow up: pending labs

## 2024-08-25 ENCOUNTER — Other Ambulatory Visit: Payer: Self-pay | Admitting: Medical

## 2024-08-25 ENCOUNTER — Ambulatory Visit: Payer: Self-pay | Admitting: Medical

## 2024-08-25 DIAGNOSIS — M609 Myositis, unspecified: Secondary | ICD-10-CM | POA: Insufficient documentation

## 2024-08-25 LAB — CBC
Hematocrit: 38.7 % (ref 34.0–46.6)
Hemoglobin: 13.3 g/dL (ref 11.1–15.9)
MCH: 29.4 pg (ref 26.6–33.0)
MCHC: 34.4 g/dL (ref 31.5–35.7)
MCV: 86 fL (ref 79–97)
Platelets: 228 x10E3/uL (ref 150–450)
RBC: 4.52 x10E6/uL (ref 3.77–5.28)
RDW: 13.1 % (ref 11.7–15.4)
WBC: 2.9 x10E3/uL — ABNORMAL LOW (ref 3.4–10.8)

## 2024-08-25 LAB — VITAMIN B12: Vitamin B-12: 403 pg/mL (ref 232–1245)

## 2024-08-25 LAB — TSH+FREE T4
Free T4: 1.05 ng/dL (ref 0.82–1.77)
TSH: 2.14 u[IU]/mL (ref 0.450–4.500)

## 2024-08-25 LAB — LIPID PANEL
Chol/HDL Ratio: 5.2 ratio — ABNORMAL HIGH (ref 0.0–4.4)
Cholesterol, Total: 306 mg/dL — ABNORMAL HIGH (ref 100–199)
HDL: 59 mg/dL (ref >39)
LDL Chol Calc (NIH): 231 mg/dL — ABNORMAL HIGH (ref 0–99)
Triglycerides: 94 mg/dL (ref 0–149)
VLDL Cholesterol Cal: 16 mg/dL (ref 5–40)

## 2024-08-25 LAB — COMPREHENSIVE METABOLIC PANEL WITH GFR
ALT: 11 IU/L (ref 0–32)
AST: 16 IU/L (ref 0–40)
Albumin: 4.2 g/dL (ref 3.8–4.8)
Alkaline Phosphatase: 87 IU/L (ref 44–121)
BUN/Creatinine Ratio: 13 (ref 12–28)
BUN: 12 mg/dL (ref 8–27)
Bilirubin Total: 0.4 mg/dL (ref 0.0–1.2)
CO2: 26 mmol/L (ref 20–29)
Calcium: 9.5 mg/dL (ref 8.7–10.3)
Chloride: 103 mmol/L (ref 96–106)
Creatinine, Ser: 0.95 mg/dL (ref 0.57–1.00)
Globulin, Total: 3 g/dL (ref 1.5–4.5)
Glucose: 74 mg/dL (ref 70–99)
Potassium: 4 mmol/L (ref 3.5–5.2)
Sodium: 140 mmol/L (ref 134–144)
Total Protein: 7.2 g/dL (ref 6.0–8.5)
eGFR: 64 mL/min/1.73 (ref >59)

## 2024-08-25 LAB — MICROALBUMIN / CREATININE URINE RATIO
Creatinine, Urine: 35.9 mg/dL
Microalb/Creat Ratio: <8 mg/g{creat} (ref 0–29)
Microalbumin, Urine: <3 ug/mL

## 2024-08-25 LAB — HEMOGLOBIN A1C
Est. average glucose Bld gHb Est-mCnc: 111 mg/dL
Hgb A1c MFr Bld: 5.5 % (ref 4.8–5.6)

## 2024-08-25 LAB — VITAMIN D 25 HYDROXY (VIT D DEFICIENCY, FRACTURES): Vit D, 25-Hydroxy: 21.4 ng/mL — ABNORMAL LOW (ref 30.0–100.0)

## 2024-08-25 MED ORDER — VITAMIN D (ERGOCALCIFEROL) 1.25 MG (50000 UNIT) PO CAPS: 50000.0000 [IU] | ORAL_CAPSULE | ORAL | 3 refills | Status: AC

## 2024-08-25 NOTE — Progress Notes (Signed)
 Your white cells run slightly low but stable.  Hemoglobin is okay.  Cholesterol is way too high.  Vitamin D  is too low.  Diabetes marker okay.  Thyroid  okay.  Liver kidney and electrolytes okay.  B12 okay.  Lets change to 50,000 weekly vitamin D  dose prescription.  Since she has not tolerated Repatha  and Crestor  in the past, would you be willing to try a different statin such as Pravachol or a different class of medicine such as fenofibrate ?

## 2024-11-19 ENCOUNTER — Other Ambulatory Visit: Payer: Self-pay | Admitting: Medical

## 2024-11-21 ENCOUNTER — Ambulatory Visit: Payer: Medicare Other

## 2024-11-21 VITALS — Ht 60.0 in | Wt 191.0 lb

## 2024-11-21 DIAGNOSIS — Z0000 Encounter for general adult medical examination without abnormal findings: Secondary | ICD-10-CM | POA: Diagnosis not present

## 2024-11-21 NOTE — Progress Notes (Signed)
 Chief Complaint  Patient presents with   Medicare Wellness     Subjective:   Paula Meyer is a 73 y.o. female who presents for a Medicare Annual Wellness Visit.  Visit info / Clinical Intake: Medicare Wellness Visit Type:: Subsequent Annual Wellness Visit Persons participating in visit and providing information:: patient Medicare Wellness Visit Mode:: Telephone If telephone:: video declined Since this visit was completed virtually, some vitals may be partially provided or unavailable. Missing vitals are due to the limitations of the virtual format.: Documented vitals are patient reported If Telephone or Video please confirm:: I connected with patient using audio/video enable telemedicine. I verified patient identity with two identifiers, discussed telehealth limitations, and patient agreed to proceed. Patient Location:: home Provider Location:: office Interpreter Needed?: No Pre-visit prep was completed: yes AWV questionnaire completed by patient prior to visit?: no Living arrangements:: (!) lives alone Patient's Overall Health Status Rating: very good Typical amount of pain: some Does pain affect daily life?: no Are you currently prescribed opioids?: no  Dietary Habits and Nutritional Risks How many meals a day?: 2 Eats fruit and vegetables daily?: yes Most meals are obtained by: preparing own meals In the last 2 weeks, have you had any of the following?: none Diabetic:: no  Functional Status Activities of Daily Living (to include ambulation/medication): Independent Ambulation: Independent Medication Administration: Independent Home Management (perform basic housework or laundry): Independent Manage your own finances?: yes Primary transportation is: driving Concerns about vision?: no *vision screening is required for WTM* Concerns about hearing?: (!) yes Uses hearing aids?: no (working on getting hearing aids)  Fall Screening Falls in the past year?: 0 Number of  falls in past year: 0 Was there an injury with Fall?: 0 Fall Risk Category Calculator: 0 Patient Fall Risk Level: Low Fall Risk  Fall Risk Patient at Risk for Falls Due to: Medication side effect Fall risk Follow up: Falls prevention discussed; Falls evaluation completed  Home and Transportation Safety: All rugs have non-skid backing?: yes All stairs or steps have railings?: yes Grab bars in the bathtub or shower?: (!) no Have non-skid surface in bathtub or shower?: yes Good home lighting?: yes Regular seat belt use?: yes Hospital stays in the last year:: no  Cognitive Assessment Difficulty concentrating, remembering, or making decisions? : no Will 6CIT or Mini Cog be Completed: yes What year is it?: 0 points What month is it?: 0 points Give patient an address phrase to remember (5 components): [address] About what time is it?: 3 points Count backwards from 20 to 1: 0 points Say the months of the year in reverse: 0 points Repeat the address phrase from earlier: 0 points 6 CIT Score: 3 points  Advance Directives (For Healthcare) Does Patient Have a Medical Advance Directive?: No  Reviewed/Updated  Reviewed/Updated: Reviewed All (Medical, Surgical, Family, Medications, Allergies, Care Teams, Patient Goals)    Allergies (verified) Paxil  [paroxetine  hcl], Shellfish allergy, Bupropion hcl, Contrast media [iodinated contrast media], Crestor  [rosuvastatin ], Lisinopril, Repatha  [evolocumab ], and Tramadol    Current Medications (verified) Outpatient Encounter Medications as of 11/21/2024  Medication Sig   acetaminophen  (TYLENOL ) 500 MG tablet Take 500 mg by mouth every 8 (eight) hours as needed for moderate pain.   albuterol  (VENTOLIN  HFA) 108 (90 Base) MCG/ACT inhaler INHALE 2 PUFFS INTO THE LUNGS EVERY 4 HOURS AS NEEDED FOR COUGH, WHEEZING OR SHORTNESS OF BREATH   Alirocumab  (PRALUENT ) 150 MG/ML SOAJ Inject 1 mL (150 mg total) into the skin every 14 (  fourteen) days.    b complex vitamins capsule Take 1 capsule by mouth every other day.   diclofenac  Sodium (VOLTAREN ) 1 % GEL APPLY 2 GRAMS TO AFFECTED AREA 4 TIMES A DAY   fluticasone -salmeterol (ADVAIR HFA) 115-21 MCG/ACT inhaler Inhale 2 puffs into the lungs 2 (two) times daily.   magnesium oxide (MAG-OX) 400 (240 Mg) MG tablet Take 400 mg by mouth daily. 360mg    Multiple Vitamin (MULTIVITAMIN ADULT PO) Take by mouth.   Potassium 99 MG TABS Take 2 tablets by mouth daily.   Secukinumab  (COSENTYX  IV) Inject into the vein.   Vitamin D , Ergocalciferol , (DRISDOL ) 1.25 MG (50000 UNIT) CAPS capsule Take 1 capsule (50,000 Units total) by mouth every 7 (seven) days.   Accu-Chek Softclix Lancets lancets Test 1-2 times daily (Patient not taking: Reported on 11/21/2024)   Glucose Blood (BLOOD GLUCOSE TEST STRIPS) STRP Test 1-2 times daily. Pt uses accu-chek guide me meter (Patient not taking: Reported on 11/21/2024)   SKYRIZI PEN 150 MG/ML pen  (Patient not taking: Reported on 11/21/2024)   valsartan  (DIOVAN ) 40 MG tablet TAKE 1 TABLET BY MOUTH EVERY DAY (Patient not taking: Reported on 11/21/2024)   No facility-administered encounter medications on file as of 11/21/2024.    History: Past Medical History:  Diagnosis Date   Allergy    Anemia    Anxiety    Aortic atherosclerosis 2022   thoracic   Arthritis    Asthma    CAD (coronary artery disease)    a. coronary calcium  score was 2,498 (99th percentile for age and sex) in 07/2021; b. negative Myoview  in 10/2021   Colitis    Depression    Heart murmur    Hx of colonic polyps    Hyperlipidemia    Hypertension    Osteoarthritis    Psoriatic arthritis [facility])    Past Surgical History:  Procedure Laterality Date   COLONOSCOPY  2018   LUMBAR LAMINECTOMY/DECOMPRESSION MICRODISCECTOMY N/A 04/09/2022   Procedure: Microlumbar decompression Lumbar five -Sacral one right;  Surgeon: Duwayne Purchase, MD;  Location: [facility] OR;  Service: Orthopedics;  Laterality: N/A;   TUBAL  LIGATION     WISDOM TOOTH EXTRACTION     Family History  Problem Relation Age of Onset   Heart attack Brother    Hyperlipidemia Brother    Hypertension Brother    Heart disease Brother    Arthritis Other    Depression Other    Colon cancer Neg Hx    Stomach cancer Neg Hx    Social History   Occupational History   Not on file  Tobacco Use   Smoking status: Former    Current packs/day: 0.00    Types: Cigarettes    Start date: 43    Quit date: 1989    Years since quitting: 36.9    Passive exposure: Never   Smokeless tobacco: Never  Vaping Use   Vaping status: Never Used  Substance and Sexual Activity   Alcohol use: Not Currently    Alcohol/week: 3.0 standard drinks of alcohol    Types: 3 Glasses of wine per week    Comment: rarely   Drug use: No   Sexual activity: Not Currently   Tobacco Counseling Counseling given: Not Answered  SDOH Screenings   Food Insecurity: No Food Insecurity (11/21/2024)  Housing: Unknown (11/21/2024)  Transportation Needs: No Transportation Needs (11/21/2024)  Utilities: Not At Risk (11/21/2024)  Alcohol Screen: Low Risk  (11/21/2024)  Depression (PHQ2-9): Low Risk  (11/21/2024)  Financial  Resource Strain: Low Risk  (11/21/2024)  Physical Activity: Inactive (11/21/2024)  Social Connections: Unknown (11/21/2024)  Recent Concern: Social Connections - Moderately Isolated (11/21/2024)  Stress: No Stress Concern Present (11/21/2024)  Recent Concern: Stress - Stress Concern Present (08/24/2024)  Tobacco Use: Medium Risk (11/21/2024)  Health Literacy: Adequate Health Literacy (11/21/2024)   See flowsheets for full screening details  Depression Screen PHQ 2 & 9 Depression Scale- Over the past 2 weeks, how often have you been bothered by any of the following problems? Little interest or pleasure in doing things: 0 Feeling down, depressed, or hopeless (PHQ Adolescent also includes...irritable): 1 (lost her dog) PHQ-2 Total Score: 1 Trouble falling or  staying asleep, or sleeping too much: 0 Feeling tired or having little energy: 0 Poor appetite or overeating (PHQ Adolescent also includes...weight loss): 0 Feeling bad about yourself - or that you are a failure or have let yourself or your family down: 0 Trouble concentrating on things, such as reading the newspaper or watching television (PHQ Adolescent also includes...like school work): 0 Moving or speaking so slowly that other people could have noticed. Or the opposite - being so fidgety or restless that you have been moving around a lot more than usual: 0 Thoughts that you would be better off dead, or of hurting yourself in some way: 0 PHQ-9 Total Score: 1 If you checked off any problems, how difficult have these problems made it for you to do your work, take care of things at home, or get along with other people?: Not difficult at all  Depression Treatment Depression Interventions/Treatment : EYV7-0 Score <4 Follow-up Not Indicated     Goals Addressed             This Visit's Progress    Patient Stated       11/21/2024, wants to lose weight             Objective:    Today's Vitals   11/21/24 1453  Weight: 191 lb (86.6 kg)  Height: 5' (1.524 m)   Body mass index is 37.3 kg/m.  Hearing/Vision screen Hearing Screening - Comments:: Working on getting hearing aids Vision Screening - Comments:: Regular eye exams, Dr. Abigail Immunizations and Health Maintenance Health Maintenance  Topic Date Due   COVID-19 Vaccine (3 - Pfizer risk series) 03/27/2020   Colonoscopy  10/26/2022   DTaP/Tdap/Td (2 - Td or Tdap) 11/22/2024 (Originally 04/01/2021)   Zoster Vaccines- Shingrix (1 of 2) 11/23/2024 (Originally 07/25/1970)   Influenza Vaccine  03/13/2025 (Originally 07/14/2024)   Mammogram  11/21/2025   Medicare Annual Wellness (AWV)  11/21/2025   Pneumococcal Vaccine: 50+ Years  Completed   Bone Density Scan  Completed   Meningococcal B Vaccine  Aged Out   Hepatitis C Screening   Discontinued        Assessment/Plan:  This is a routine wellness examination for Paula Meyer.  Patient Care Team: Tysinger, Alm RAMAN, PA-C as PCP - General (Family Medicine) O'Neal, Darryle Ned, MD as PCP - Cardiology (Cardiology) Lilton Legions, DO as Consulting Physician (Obstetrics and Gynecology) Duwayne Purchase, MD as Consulting Physician (Orthopedic Surgery) Abigail Maude POUR [facility])  I have personally reviewed and noted the following in the patient's chart:   Medical and social history Use of alcohol, tobacco or illicit drugs  Current medications and supplements including opioid prescriptions. Functional ability and status Nutritional status Physical activity Advanced directives List of other physicians Hospitalizations, surgeries, and ER visits in previous 12 months Vitals Screenings to include cognitive,  depression, and falls Referrals and appointments  No orders of the defined types were placed in this encounter.  In addition, I have reviewed and discussed with patient certain preventive protocols, quality metrics, and best practice recommendations. A written personalized care plan for preventive services as well as general preventive health recommendations were provided to patient.   Paula FORBES Dawn, LPN   [id number]   Return in 1 year (on 11/21/2025).  After Visit Summary: (MyChart) Due to this being a telephonic visit, the after visit summary with patients personalized plan was offered to patient via MyChart   Nurse Notes: Declines covid vaccine. Patient states she is going to follow up on scheduling colonoscopy.

## 2024-11-21 NOTE — Patient Instructions (Signed)
 [name],  Thank you for taking the time for your Medicare Wellness Visit. I appreciate your continued commitment to your health goals. Please review the care plan we discussed, and feel free to reach out if I can assist you further.  Please note that Annual Wellness Visits do not include a physical exam. Some assessments may be limited, especially if the visit was conducted virtually. If needed, we may recommend an in-person follow-up with your provider.  Ongoing Care Seeing your primary care provider every 3 to 6 months helps us  monitor your health and provide consistent, personalized care.   Referrals If a referral was made during today's visit and you haven't received any updates within two weeks, please contact the referred provider directly to check on the status.  Recommended Screenings:  Health Maintenance  Topic Date Due   COVID-19 Vaccine (3 - Pfizer risk series) 03/27/2020   Colon Cancer Screening  10/26/2022   Medicare Annual Wellness Visit  11/15/2024   DTaP/Tdap/Td vaccine (2 - Td or Tdap) 11/22/2024*   Zoster (Shingles) Vaccine (1 of 2) 11/23/2024*   Flu Shot  03/13/2025*   Breast Cancer Screening  11/21/2025   Pneumococcal Vaccine for age over 57  Completed   Osteoporosis screening with Bone Density Scan  Completed   Meningitis B Vaccine  Aged Out   Hepatitis C Screening  Discontinued  *Topic was postponed. The date shown is not the original due date.       11/21/2024    3:09 PM  Advanced Directives  Does Patient Have a Medical Advance Directive? No    Vision: Annual vision screenings are recommended for early detection of glaucoma, cataracts, and diabetic retinopathy. These exams can also reveal signs of chronic conditions such as diabetes and high blood pressure.  Dental: Annual dental screenings help detect early signs of oral cancer, gum disease, and other conditions linked to overall health, including heart disease and diabetes.  Please see the attached  documents for additional preventive care recommendations.
